# Patient Record
Sex: Male | Born: 1935 | Race: White | Hispanic: Yes | Marital: Married | Smoking: Never smoker
Health system: Southern US, Community
[De-identification: ages and names within clinical notes are randomized; demographics above are authoritative.]

## PROBLEM LIST (undated history)

## (undated) DIAGNOSIS — I2510 Atherosclerotic heart disease of native coronary artery without angina pectoris: Secondary | ICD-10-CM

## (undated) DIAGNOSIS — M1990 Unspecified osteoarthritis, unspecified site: Secondary | ICD-10-CM

## (undated) DIAGNOSIS — S12300A Unspecified displaced fracture of fourth cervical vertebra, initial encounter for closed fracture: Secondary | ICD-10-CM

## (undated) DIAGNOSIS — F0390 Unspecified dementia without behavioral disturbance: Secondary | ICD-10-CM

## (undated) DIAGNOSIS — Z8673 Personal history of transient ischemic attack (TIA), and cerebral infarction without residual deficits: Secondary | ICD-10-CM

## (undated) DIAGNOSIS — E785 Hyperlipidemia, unspecified: Secondary | ICD-10-CM

## (undated) DIAGNOSIS — Z889 Allergy status to unspecified drugs, medicaments and biological substances status: Secondary | ICD-10-CM

## (undated) DIAGNOSIS — S129XXA Fracture of neck, unspecified, initial encounter: Secondary | ICD-10-CM

## (undated) DIAGNOSIS — R4702 Dysphasia: Secondary | ICD-10-CM

## (undated) DIAGNOSIS — I10 Essential (primary) hypertension: Secondary | ICD-10-CM

## (undated) DIAGNOSIS — I482 Chronic atrial fibrillation, unspecified: Secondary | ICD-10-CM

## (undated) DIAGNOSIS — I63511 Cerebral infarction due to unspecified occlusion or stenosis of right middle cerebral artery: Principal | ICD-10-CM

## (undated) DIAGNOSIS — Z8719 Personal history of other diseases of the digestive system: Secondary | ICD-10-CM

## (undated) DIAGNOSIS — Z951 Presence of aortocoronary bypass graft: Secondary | ICD-10-CM

## (undated) DIAGNOSIS — Z87442 Personal history of urinary calculi: Secondary | ICD-10-CM

## (undated) DIAGNOSIS — S72009A Fracture of unspecified part of neck of unspecified femur, initial encounter for closed fracture: Secondary | ICD-10-CM

## (undated) DIAGNOSIS — H409 Unspecified glaucoma: Secondary | ICD-10-CM

## (undated) DIAGNOSIS — Z8711 Personal history of peptic ulcer disease: Secondary | ICD-10-CM

## (undated) DIAGNOSIS — K5792 Diverticulitis of intestine, part unspecified, without perforation or abscess without bleeding: Secondary | ICD-10-CM

## (undated) DIAGNOSIS — D649 Anemia, unspecified: Secondary | ICD-10-CM

## (undated) DIAGNOSIS — E039 Hypothyroidism, unspecified: Secondary | ICD-10-CM

## (undated) DIAGNOSIS — Z7901 Long term (current) use of anticoagulants: Secondary | ICD-10-CM

## (undated) DIAGNOSIS — I63512 Cerebral infarction due to unspecified occlusion or stenosis of left middle cerebral artery: Secondary | ICD-10-CM

## (undated) DIAGNOSIS — S32409A Unspecified fracture of unspecified acetabulum, initial encounter for closed fracture: Secondary | ICD-10-CM

## (undated) HISTORY — PX: APPENDECTOMY: SHX54

## (undated) HISTORY — DX: Diverticulitis of intestine, part unspecified, without perforation or abscess without bleeding: K57.92

## (undated) HISTORY — DX: Cerebral infarction due to unspecified occlusion or stenosis of left middle cerebral artery: I63.512

## (undated) HISTORY — PX: CATARACT EXTRACTION W/ INTRAOCULAR LENS  IMPLANT, BILATERAL: SHX1307

## (undated) HISTORY — DX: Allergy status to unspecified drugs, medicaments and biological substances: Z88.9

## (undated) HISTORY — DX: Atherosclerotic heart disease of native coronary artery without angina pectoris: I25.10

## (undated) HISTORY — DX: Personal history of urinary calculi: Z87.442

## (undated) HISTORY — DX: Chronic atrial fibrillation, unspecified: I48.20

## (undated) HISTORY — DX: Cerebral infarction due to unspecified occlusion or stenosis of right middle cerebral artery: I63.511

## (undated) HISTORY — DX: Fracture of unspecified part of neck of unspecified femur, initial encounter for closed fracture: S72.009A

## (undated) HISTORY — DX: Unspecified glaucoma: H40.9

## (undated) HISTORY — DX: Long term (current) use of anticoagulants: Z79.01

## (undated) HISTORY — PX: CARDIAC CATHETERIZATION: SHX172

## (undated) HISTORY — DX: Personal history of transient ischemic attack (TIA), and cerebral infarction without residual deficits: Z86.73

## (undated) HISTORY — DX: Presence of aortocoronary bypass graft: Z95.1

## (undated) HISTORY — PX: FRACTURE SURGERY: SHX138

## (undated) HISTORY — DX: Hyperlipidemia, unspecified: E78.5

## (undated) HISTORY — DX: Essential (primary) hypertension: I10

## (undated) HISTORY — DX: Unspecified displaced fracture of fourth cervical vertebra, initial encounter for closed fracture: S12.300A

---

## 1983-08-30 HISTORY — PX: FINGER AMPUTATION: SHX636

## 1992-08-29 DIAGNOSIS — I2510 Atherosclerotic heart disease of native coronary artery without angina pectoris: Secondary | ICD-10-CM

## 1992-08-29 DIAGNOSIS — Z951 Presence of aortocoronary bypass graft: Secondary | ICD-10-CM

## 1992-08-29 HISTORY — DX: Presence of aortocoronary bypass graft: Z95.1

## 1992-08-29 HISTORY — PX: CORONARY ARTERY BYPASS GRAFT: SHX141

## 1992-08-29 HISTORY — DX: Atherosclerotic heart disease of native coronary artery without angina pectoris: I25.10

## 1998-08-29 HISTORY — PX: FEMUR SURGERY: SHX943

## 2006-09-29 HISTORY — PX: TRANSTHORACIC ECHOCARDIOGRAM: SHX275

## 2006-09-29 HISTORY — PX: OTHER SURGICAL HISTORY: SHX169

## 2007-04-12 ENCOUNTER — Ambulatory Visit (HOSPITAL_COMMUNITY): Admission: RE | Admit: 2007-04-12 | Discharge: 2007-04-12 | Payer: Self-pay | Admitting: *Deleted

## 2007-06-05 ENCOUNTER — Encounter: Admission: RE | Admit: 2007-06-05 | Discharge: 2007-06-05 | Payer: Self-pay | Admitting: Gastroenterology

## 2012-12-15 ENCOUNTER — Encounter: Payer: Self-pay | Admitting: Pharmacist Clinician (PhC)/ Clinical Pharmacy Specialist

## 2012-12-15 DIAGNOSIS — I4891 Unspecified atrial fibrillation: Secondary | ICD-10-CM

## 2012-12-15 DIAGNOSIS — I482 Chronic atrial fibrillation, unspecified: Secondary | ICD-10-CM

## 2012-12-15 DIAGNOSIS — Z7901 Long term (current) use of anticoagulants: Secondary | ICD-10-CM

## 2012-12-15 HISTORY — DX: Chronic atrial fibrillation, unspecified: I48.20

## 2012-12-15 HISTORY — DX: Long term (current) use of anticoagulants: Z79.01

## 2013-01-08 ENCOUNTER — Telehealth: Payer: Self-pay | Admitting: Cardiology

## 2013-01-08 MED ORDER — SIMVASTATIN 40 MG PO TABS: 40.0000 mg | ORAL_TABLET | Freq: Every day | ORAL | Status: DC

## 2013-01-08 MED ORDER — CARVEDILOL 3.125 MG PO TABS: 3.1250 mg | ORAL_TABLET | Freq: Two times a day (BID) | ORAL | Status: DC

## 2013-01-08 NOTE — Telephone Encounter (Signed)
Need a prescription refill on : Simvastatin-40mg , Carvedilol.Marland KitchenMarland KitchenPharmacy---[facility] [address] in [place]...[phone]

## 2013-01-08 NOTE — Telephone Encounter (Signed)
Requested rx's sent electronically to pharmacy.

## 2013-01-24 ENCOUNTER — Ambulatory Visit: Payer: Self-pay | Admitting: Pharmacist Clinician (PhC)/ Clinical Pharmacy Specialist

## 2013-01-25 ENCOUNTER — Ambulatory Visit (INDEPENDENT_AMBULATORY_CARE_PROVIDER_SITE_OTHER): Payer: Medicare Other | Admitting: Pharmacist Clinician (PhC)/ Clinical Pharmacy Specialist

## 2013-01-25 VITALS — BP 150/90 | HR 76

## 2013-01-25 DIAGNOSIS — I4891 Unspecified atrial fibrillation: Secondary | ICD-10-CM

## 2013-01-25 DIAGNOSIS — Z7901 Long term (current) use of anticoagulants: Secondary | ICD-10-CM

## 2013-02-22 ENCOUNTER — Ambulatory Visit (INDEPENDENT_AMBULATORY_CARE_PROVIDER_SITE_OTHER): Payer: Medicare Other | Admitting: Pharmacist Clinician (PhC)/ Clinical Pharmacy Specialist

## 2013-02-22 VITALS — BP 138/72 | HR 72

## 2013-02-22 DIAGNOSIS — I4891 Unspecified atrial fibrillation: Secondary | ICD-10-CM

## 2013-02-22 DIAGNOSIS — Z7901 Long term (current) use of anticoagulants: Secondary | ICD-10-CM

## 2013-02-22 LAB — POCT INR: INR: 4.9

## 2013-03-04 ENCOUNTER — Ambulatory Visit (INDEPENDENT_AMBULATORY_CARE_PROVIDER_SITE_OTHER): Payer: Medicare Other | Admitting: Pharmacist Clinician (PhC)/ Clinical Pharmacy Specialist

## 2013-03-04 ENCOUNTER — Ambulatory Visit: Payer: Medicare Other | Admitting: Pharmacist Clinician (PhC)/ Clinical Pharmacy Specialist

## 2013-03-04 VITALS — BP 104/60 | HR 76

## 2013-03-04 DIAGNOSIS — Z7901 Long term (current) use of anticoagulants: Secondary | ICD-10-CM

## 2013-03-04 DIAGNOSIS — I4891 Unspecified atrial fibrillation: Secondary | ICD-10-CM

## 2013-03-18 ENCOUNTER — Ambulatory Visit (INDEPENDENT_AMBULATORY_CARE_PROVIDER_SITE_OTHER): Payer: Medicare Other | Admitting: Pharmacist Clinician (PhC)/ Clinical Pharmacy Specialist

## 2013-03-18 VITALS — BP 120/70 | HR 60

## 2013-03-18 DIAGNOSIS — I4891 Unspecified atrial fibrillation: Secondary | ICD-10-CM

## 2013-03-18 DIAGNOSIS — Z7901 Long term (current) use of anticoagulants: Secondary | ICD-10-CM

## 2013-04-09 ENCOUNTER — Ambulatory Visit: Payer: Medicare Other | Admitting: Pharmacist Clinician (PhC)/ Clinical Pharmacy Specialist

## 2013-04-11 ENCOUNTER — Ambulatory Visit (INDEPENDENT_AMBULATORY_CARE_PROVIDER_SITE_OTHER): Payer: Medicare Other | Admitting: Pharmacist Clinician (PhC)/ Clinical Pharmacy Specialist

## 2013-04-11 VITALS — BP 154/82 | HR 72

## 2013-04-11 DIAGNOSIS — Z7901 Long term (current) use of anticoagulants: Secondary | ICD-10-CM

## 2013-04-11 DIAGNOSIS — I4891 Unspecified atrial fibrillation: Secondary | ICD-10-CM

## 2013-05-09 ENCOUNTER — Ambulatory Visit (INDEPENDENT_AMBULATORY_CARE_PROVIDER_SITE_OTHER): Payer: Medicare Other | Admitting: Cardiology

## 2013-05-09 ENCOUNTER — Encounter: Payer: Self-pay | Admitting: Cardiology

## 2013-05-09 ENCOUNTER — Ambulatory Visit (INDEPENDENT_AMBULATORY_CARE_PROVIDER_SITE_OTHER): Payer: Medicare Other | Admitting: Pharmacist Clinician (PhC)/ Clinical Pharmacy Specialist

## 2013-05-09 VITALS — BP 100/62 | HR 52 | Ht 67.0 in | Wt 165.4 lb

## 2013-05-09 DIAGNOSIS — I2510 Atherosclerotic heart disease of native coronary artery without angina pectoris: Secondary | ICD-10-CM

## 2013-05-09 DIAGNOSIS — Z951 Presence of aortocoronary bypass graft: Secondary | ICD-10-CM

## 2013-05-09 DIAGNOSIS — Z7901 Long term (current) use of anticoagulants: Secondary | ICD-10-CM

## 2013-05-09 DIAGNOSIS — I4891 Unspecified atrial fibrillation: Secondary | ICD-10-CM

## 2013-05-09 DIAGNOSIS — I10 Essential (primary) hypertension: Secondary | ICD-10-CM

## 2013-05-09 DIAGNOSIS — E785 Hyperlipidemia, unspecified: Secondary | ICD-10-CM

## 2013-05-09 NOTE — Patient Instructions (Signed)
He looks GREAT!!  I want to make things easier & will cut the Lisinopril down to only 1 tab daily (~20mg  tab) not 2.   Otherwise, I will see him back in 1 yr or sooner if he has symptoms of Chest Pain, Pressure, or shortness of breath.  Marykay Lex, MD   Your physician wants you to follow-up in: 1 yr You will receive a reminder letter in the mail two months in advance. If you don't receive a letter, please call our office to schedule the follow-up appointment.

## 2013-05-22 NOTE — Progress Notes (Signed)
PCP: Aida Puffer, MD  Clinic Note: Chief Complaint  Patient presents with  . Annual Exam    no chest pain,no sob, no edema    HPI: Jacob [name] is a 79 y.o. male with a PMH below who presents today for followup of his coronary disease status post CABG and chronic A. fib. He is a very pleasant gentleman who enjoyed getting to know his family. Spanish-speaking only he is accompanied by either daughter or son-in-law, second marriage. He is a native of [place] he had CABG x4 in 1994 (LIMA-LAD, SVG- RI, SVG-OM, SVG- RCA). As best I can tell he has not had a catheterization since his CABG in 26. He had a last Myoview in 2008 which showed no evidence of ischemia with a fixed inferior defect. He also chronic atrial fibrillation, rate controlled and on Coumadin. I last saw him in August of 2013 and he was doing outstandingly well on a regimen.  Interval History: He presents today with his daughter as interpreter. He is in great spirits very impressive he walks up to 3 times a day for about 10-15 minutes or more. Any more than that is limited but it has been bad vision with glaucoma. They worry about his unstable balance. With the knot exertion he does he denies any chest tightness chest pressure or shortness of breath with rest or exertion. No heart failure symptoms of PND, orthopnea or edema. No syncope or near syncope. No TIA or amaurosis fugax symptoms. No melena, hematochezia or hematuria.  As I noted before he still has the weak legs in and out of it he is walking too much further than about a half mile or so. Some are longline he was on lisinopril 20 mg twice a day and has had some episodes of lightheadedness and dizziness.  Past Medical History  Diagnosis Date  . CAD in native artery -->  1994    Status or  . S/P CABG x 4 1994    LIMA-LAD, SVG-RI, SVG-OM, SVG-RCA  . Chronic atrial fibrillation 12/15/2012  . Long term (current) use of anticoagulants 12/15/2012  . Essential hypertension  05/23/2013  . Dyslipidemia, goal LDL below 70[272.4]      - on statin, followed by primary physician.   . Glaucoma     Prior Cardiac Evaluation and Past Surgical History: Past Surgical History  Procedure Laterality Date  . Coronary artery bypass graft  1994    LIMA-LAD, SVG-RI, SVG-OM, SVG-RCA   . Persantine myoview  February 2008    Fixed inferior wall defect-consistent with infarct/scar without peri-infarct ischemia  . Transthoracic echocardiogram  February 2008    Mild/moderate concentric LVH, normal size normal function. Mild diastolic function (abdomen excision), mild to moderate aortic regurgitation and mildly sclerotic aortic valve.   No Known Allergies  Current Outpatient Prescriptions  Medication Sig Dispense Refill  . carvedilol (COREG) 3.125 MG tablet Take 1 tablet (3.125 mg total) by mouth 2 (two) times daily.  60 tablet  6  . latanoprost (XALATAN) 0.005 % ophthalmic solution Place 1 drop into both eyes at bedtime.      Marland Kitchen lisinopril (PRINIVIL,ZESTRIL) 20 MG tablet Take 2 tablets by mouth daily      . simvastatin (ZOCOR) 40 MG tablet Take 1 tablet (40 mg total) by mouth at bedtime.  30 tablet  6  . SYNTHROID 112 MCG tablet Take 112 mcg by mouth daily.      . timolol (TIMOPTIC) 0.5 % ophthalmic solution Place 1 drop into both  eyes daily.      Marland Kitchen warfarin (COUMADIN) 5 MG tablet        No current facility-administered medications for this visit.    History   Social History Narrative   Very pleasant native [place] who is Spanish-speaking only after coming [place] in the 67s he is remarried with a total of 4 children, one daughter, and second marriage. He is a grandchildren and one great grand children at present.   He is extremely active and does routinely walk up to 3 times a day for 10-15 minutes at a time. He really is limited by an unstable balance and gait due to his glaucoma and inability to see well.   ROS: A comprehensive Review of Systems - As negative by all  systems. Poor vision due to glaucoma, unsteady gait with poor balance due to decreased vision.  PHYSICAL EXAM BP 100/62  Pulse 52  Ht 5\' 7"  (1.702 m)  Wt 165 lb 6.4 oz (75.025 kg)  BMI 25.9 kg/m2 General appearance: alert, cooperative, appears stated age, no distress and Relatively hard of hearing but also in Spanish only speaking. Very pleasant gentleman healthy appearing, and actually looks younger than his stated age. He has normal mood affect and exquisite appropriately in Spanish Neck: no adenopathy, no carotid bruit, no JVD, supple, symmetrical, trachea midline and thyroid not enlarged, symmetric, no tenderness/mass/nodules Lungs: clear to auscultation bilaterally, normal percussion bilaterally and Nonlabored, good air movement Heart: normal apical impulse, irregularly irregular rhythm, S1, S2 normal, no S3 or S4 and 2/6 crescendo/decrescendo SEM at RUSB --> carotids, very subtle possible diastolic murmur heard near the apex. Abdomen: soft, non-tender; bowel sounds normal; no masses,  no organomegaly Extremities: extremities normal, atraumatic, no cyanosis or edema, no edema, redness or tenderness in the calves or thighs and no ulcers, gangrene or trophic changes Pulses: 2+ and symmetric Neurologic: Grossly normal; clear thought process, A& Ox3.  YQM:VHQIONGEX today: Yes Rate: 52 , Rhythm: A. fib with slow ventricular response, otherwise normal;   Recent Labs: Lipids followed by primary physician  ASSESSMENT / PLAN: Chronic atrial fibrillation Rate well controlled so bradycardic on low dose of carvedilol. I am reluctant to fully stop the beta blocker, and he has not noticed any symptoms that would warrant when to stop. He is not symptomatic at all for A. Fib.  He is anticoagulated with warfarin, and rate controlled appropriately.  Long term (current) use of anticoagulants On warfarin therapy followed by [facility], or pH  CAD in native artery -->  CABG x4 in 1994 No active  angina symptoms. He is on the beta blocker, ACE inhibitor and statin. He has not taken aspirin but is on warfarin.  Continue current regiment, no change needed.  S/P CABG x 4 (1994) : LIMA-LAD, SVG-RI, SVG-OM, SVG-RCA 20 years post CABG with what would appear to be a lesion of the SVG-RCA all Myoview. He is not, nor the family very interested in pursuing aggressive evaluations with Myoview Stress Test unless symptoms occur.  Dyslipidemia, goal LDL below 70 - on statin, followed by primary physician. Currently followed by the primary physician on 40 mg of simvastatin. He is due for repeat laboratory studies in the near future. We could always check them here at the level of under our office, if this would be easier.  Essential hypertension Extremely well controlled on his current medications. Simply because the blood pressures in the 100 mm Hgb in the systolic range, I think he probably is on  too much ACE inhibitor, or good decrease in total 20 mg a day. He continued to 20 mg once a day or 10 mg twice a day.   Orders Placed This Encounter  Procedures  . EKG 12-Lead   Followup: One year  DAVID W. Herbie Baltimore, M.D., M.S. [facility] [address] [place], [place]  [address]  [phone] Pager # [phone]

## 2013-05-23 ENCOUNTER — Encounter: Payer: Self-pay | Admitting: Cardiology

## 2013-05-23 DIAGNOSIS — I2510 Atherosclerotic heart disease of native coronary artery without angina pectoris: Secondary | ICD-10-CM | POA: Insufficient documentation

## 2013-05-23 DIAGNOSIS — E785 Hyperlipidemia, unspecified: Secondary | ICD-10-CM | POA: Insufficient documentation

## 2013-05-23 DIAGNOSIS — Z951 Presence of aortocoronary bypass graft: Secondary | ICD-10-CM | POA: Insufficient documentation

## 2013-05-23 DIAGNOSIS — I10 Essential (primary) hypertension: Secondary | ICD-10-CM

## 2013-05-23 HISTORY — DX: Essential (primary) hypertension: I10

## 2013-05-23 NOTE — Assessment & Plan Note (Signed)
20 years post CABG with what would appear to be a lesion of the SVG-RCA all Myoview. He is not, nor the family very interested in pursuing aggressive evaluations with Myoview Stress Test unless symptoms occur.

## 2013-05-23 NOTE — Assessment & Plan Note (Signed)
Extremely well controlled on his current medications. Simply because the blood pressures in the 100 mm Hgb in the systolic range, I think he probably is on too much ACE inhibitor, or good decrease in total 20 mg a day. He continued to 20 mg once a day or 10 mg twice a day.

## 2013-05-23 NOTE — Assessment & Plan Note (Signed)
Currently followed by the primary physician on 40 mg of simvastatin. He is due for repeat laboratory studies in the near future. We could always check them here at the level of under our office, if this would be easier.

## 2013-05-23 NOTE — Assessment & Plan Note (Signed)
No active angina symptoms. He is on the beta blocker, ACE inhibitor and statin. He has not taken aspirin but is on warfarin.  Continue current regiment, no change needed.

## 2013-05-23 NOTE — Assessment & Plan Note (Signed)
Rate well controlled so bradycardic on low dose of carvedilol. I am reluctant to fully stop the beta blocker, and he has not noticed any symptoms that would warrant when to stop. He is not symptomatic at all for A. Fib.  He is anticoagulated with warfarin, and rate controlled appropriately.

## 2013-05-23 NOTE — Assessment & Plan Note (Signed)
On warfarin therapy followed by [facility], or pH

## 2013-06-06 ENCOUNTER — Ambulatory Visit: Payer: Medicare Other | Admitting: Pharmacist Clinician (PhC)/ Clinical Pharmacy Specialist

## 2013-06-10 ENCOUNTER — Ambulatory Visit (INDEPENDENT_AMBULATORY_CARE_PROVIDER_SITE_OTHER): Payer: Medicare Other | Admitting: Pharmacist Clinician (PhC)/ Clinical Pharmacy Specialist

## 2013-06-10 VITALS — BP 140/70 | HR 60

## 2013-06-10 DIAGNOSIS — I482 Chronic atrial fibrillation, unspecified: Secondary | ICD-10-CM

## 2013-06-10 DIAGNOSIS — I4891 Unspecified atrial fibrillation: Secondary | ICD-10-CM

## 2013-06-10 DIAGNOSIS — Z7901 Long term (current) use of anticoagulants: Secondary | ICD-10-CM

## 2013-07-03 LAB — COMPREHENSIVE METABOLIC PANEL
ALK PHOS: 76 U/L
ALT: 9
AST: 17 U/L
Albumin: 3.7
BILIRUBIN TOTAL: 0.4 mg/dL
CREATININE: 1.02
SODIUM: 142

## 2013-07-03 LAB — CBC
HGB: 11.2 g/dL
PLATELETS: 259
WBC: 5.6

## 2013-07-03 LAB — LIPID PANEL
CHOLESTEROL: 130
HDL: 41 mg/dL (ref 35–70)
LDL (calc): 64
Triglycerides: 123

## 2013-07-03 LAB — VITAMIN D 25 HYDROXY (VIT D DEFICIENCY, FRACTURES): VIT D 25 HYDROXY: 39

## 2013-07-03 LAB — TSH: TSH: 3.56 u[IU]/mL (ref 0.41–5.90)

## 2013-07-03 LAB — VITAMIN B12: VITAMIN B12: 441

## 2013-07-08 ENCOUNTER — Ambulatory Visit: Payer: Medicare Other | Admitting: Pharmacist Clinician (PhC)/ Clinical Pharmacy Specialist

## 2013-07-14 ENCOUNTER — Other Ambulatory Visit: Payer: Self-pay | Admitting: Cardiology

## 2013-08-14 ENCOUNTER — Other Ambulatory Visit: Payer: Self-pay | Admitting: Cardiology

## 2013-08-14 NOTE — Telephone Encounter (Signed)
Rx was sent to pharmacy electronically. 

## 2013-08-29 DIAGNOSIS — S12300A Unspecified displaced fracture of fourth cervical vertebra, initial encounter for closed fracture: Secondary | ICD-10-CM

## 2013-08-29 DIAGNOSIS — S129XXA Fracture of neck, unspecified, initial encounter: Secondary | ICD-10-CM

## 2013-08-29 HISTORY — DX: Fracture of neck, unspecified, initial encounter: S12.9XXA

## 2013-08-29 HISTORY — DX: Unspecified displaced fracture of fourth cervical vertebra, initial encounter for closed fracture: S12.300A

## 2013-10-29 ENCOUNTER — Ambulatory Visit (INDEPENDENT_AMBULATORY_CARE_PROVIDER_SITE_OTHER): Payer: Commercial Managed Care - HMO | Admitting: Pharmacist Clinician (PhC)/ Clinical Pharmacy Specialist

## 2013-10-29 VITALS — BP 150/68 | HR 60

## 2013-10-29 DIAGNOSIS — Z7901 Long term (current) use of anticoagulants: Secondary | ICD-10-CM

## 2013-10-29 DIAGNOSIS — I482 Chronic atrial fibrillation, unspecified: Secondary | ICD-10-CM

## 2013-10-29 DIAGNOSIS — I4891 Unspecified atrial fibrillation: Secondary | ICD-10-CM

## 2013-10-29 LAB — POCT INR: INR: 2.2

## 2013-11-20 ENCOUNTER — Emergency Department: Payer: Self-pay | Admitting: Internal Medicine

## 2013-11-20 LAB — CBC WITH DIFFERENTIAL/PLATELET
BASOS ABS: 0.1 10*3/uL (ref 0.0–0.1)
Basophil %: 0.7 %
EOS PCT: 1.5 %
Eosinophil #: 0.2 10*3/uL (ref 0.0–0.7)
HCT: 36.7 % — ABNORMAL LOW (ref 40.0–52.0)
HGB: 12.1 g/dL — ABNORMAL LOW (ref 13.0–18.0)
LYMPHS PCT: 14.4 %
Lymphocyte #: 1.5 10*3/uL (ref 1.0–3.6)
MCH: 32 pg (ref 26.0–34.0)
MCHC: 32.9 g/dL (ref 32.0–36.0)
MCV: 98 fL (ref 80–100)
MONO ABS: 1.1 x10 3/mm — AB (ref 0.2–1.0)
Monocyte %: 10.7 %
NEUTROS ABS: 7.6 10*3/uL — AB (ref 1.4–6.5)
Neutrophil %: 72.7 %
Platelet: 251 10*3/uL (ref 150–440)
RBC: 3.76 10*6/uL — AB (ref 4.40–5.90)
RDW: 13 % (ref 11.5–14.5)
WBC: 10.5 10*3/uL (ref 3.8–10.6)

## 2013-11-20 LAB — SEDIMENTATION RATE: Erythrocyte Sed Rate: 86 mm/hr — ABNORMAL HIGH (ref 0–20)

## 2013-11-20 LAB — BASIC METABOLIC PANEL
Anion Gap: 5 — ABNORMAL LOW (ref 7–16)
BUN: 25 mg/dL — AB (ref 7–18)
CREATININE: 1.41 mg/dL — AB (ref 0.60–1.30)
Calcium, Total: 9.5 mg/dL (ref 8.5–10.1)
Chloride: 106 mmol/L (ref 98–107)
Co2: 30 mmol/L (ref 21–32)
GFR CALC AF AMER: 49 — AB
GFR CALC NON AF AMER: 42 — AB
Glucose: 95 mg/dL (ref 65–99)
Osmolality: 285 (ref 275–301)
Potassium: 4 mmol/L (ref 3.5–5.1)
SODIUM: 141 mmol/L (ref 136–145)

## 2013-11-20 LAB — URIC ACID: URIC ACID: 6.5 mg/dL (ref 3.5–7.2)

## 2013-12-11 ENCOUNTER — Ambulatory Visit: Payer: Medicare Other | Admitting: Pharmacist Clinician (PhC)/ Clinical Pharmacy Specialist

## 2013-12-13 ENCOUNTER — Ambulatory Visit: Payer: Medicare Other | Admitting: Pharmacist Clinician (PhC)/ Clinical Pharmacy Specialist

## 2013-12-18 ENCOUNTER — Other Ambulatory Visit: Payer: Self-pay | Admitting: Cardiology

## 2013-12-20 ENCOUNTER — Ambulatory Visit: Payer: Medicare Other | Admitting: Pharmacist Clinician (PhC)/ Clinical Pharmacy Specialist

## 2013-12-27 ENCOUNTER — Ambulatory Visit (INDEPENDENT_AMBULATORY_CARE_PROVIDER_SITE_OTHER): Payer: Commercial Managed Care - HMO | Admitting: Pharmacist Clinician (PhC)/ Clinical Pharmacy Specialist

## 2013-12-27 DIAGNOSIS — I482 Chronic atrial fibrillation, unspecified: Secondary | ICD-10-CM

## 2013-12-27 DIAGNOSIS — I4891 Unspecified atrial fibrillation: Secondary | ICD-10-CM

## 2013-12-27 DIAGNOSIS — Z7901 Long term (current) use of anticoagulants: Secondary | ICD-10-CM

## 2013-12-27 LAB — POCT INR: INR: 3.6

## 2014-01-22 ENCOUNTER — Ambulatory Visit: Payer: Medicare Other | Admitting: Pharmacist Clinician (PhC)/ Clinical Pharmacy Specialist

## 2014-01-29 ENCOUNTER — Ambulatory Visit (INDEPENDENT_AMBULATORY_CARE_PROVIDER_SITE_OTHER): Payer: Commercial Managed Care - HMO | Admitting: Pharmacist Clinician (PhC)/ Clinical Pharmacy Specialist

## 2014-01-29 DIAGNOSIS — Z7901 Long term (current) use of anticoagulants: Secondary | ICD-10-CM

## 2014-01-29 DIAGNOSIS — I4891 Unspecified atrial fibrillation: Secondary | ICD-10-CM

## 2014-01-29 DIAGNOSIS — I482 Chronic atrial fibrillation, unspecified: Secondary | ICD-10-CM

## 2014-01-29 LAB — POCT INR: INR: 2

## 2014-03-18 ENCOUNTER — Ambulatory Visit: Payer: Commercial Managed Care - HMO | Admitting: Pharmacist Clinician (PhC)/ Clinical Pharmacy Specialist

## 2014-03-21 ENCOUNTER — Ambulatory Visit: Payer: Commercial Managed Care - HMO | Admitting: Pharmacist Clinician (PhC)/ Clinical Pharmacy Specialist

## 2014-03-24 ENCOUNTER — Ambulatory Visit (INDEPENDENT_AMBULATORY_CARE_PROVIDER_SITE_OTHER): Payer: Commercial Managed Care - HMO | Admitting: Pharmacist Clinician (PhC)/ Clinical Pharmacy Specialist

## 2014-03-24 DIAGNOSIS — Z7901 Long term (current) use of anticoagulants: Secondary | ICD-10-CM

## 2014-03-24 DIAGNOSIS — I4891 Unspecified atrial fibrillation: Secondary | ICD-10-CM

## 2014-03-24 DIAGNOSIS — I482 Chronic atrial fibrillation, unspecified: Secondary | ICD-10-CM

## 2014-03-24 LAB — POCT INR: INR: 2.5

## 2014-03-24 MED ORDER — WARFARIN SODIUM 5 MG PO TABS: ORAL_TABLET | ORAL | Status: DC

## 2014-03-25 ENCOUNTER — Other Ambulatory Visit: Payer: Self-pay | Admitting: Pharmacist Clinician (PhC)/ Clinical Pharmacy Specialist

## 2014-03-25 MED ORDER — WARFARIN SODIUM 5 MG PO TABS: ORAL_TABLET | ORAL | Status: DC

## 2014-05-07 ENCOUNTER — Ambulatory Visit (INDEPENDENT_AMBULATORY_CARE_PROVIDER_SITE_OTHER): Payer: Medicare HMO | Admitting: Pharmacist Clinician (PhC)/ Clinical Pharmacy Specialist

## 2014-05-07 DIAGNOSIS — I482 Chronic atrial fibrillation, unspecified: Secondary | ICD-10-CM

## 2014-05-07 DIAGNOSIS — Z7901 Long term (current) use of anticoagulants: Secondary | ICD-10-CM

## 2014-05-07 DIAGNOSIS — I4891 Unspecified atrial fibrillation: Secondary | ICD-10-CM

## 2014-05-07 LAB — POCT INR: INR: 2.1

## 2014-06-13 ENCOUNTER — Other Ambulatory Visit: Payer: Self-pay | Admitting: Cardiology

## 2014-06-13 NOTE — Telephone Encounter (Signed)
,  Rx was sent to pharmacy electronically. OV 11/2

## 2014-06-16 ENCOUNTER — Ambulatory Visit (INDEPENDENT_AMBULATORY_CARE_PROVIDER_SITE_OTHER): Payer: 59 | Admitting: Pharmacist Clinician (PhC)/ Clinical Pharmacy Specialist

## 2014-06-16 DIAGNOSIS — I4891 Unspecified atrial fibrillation: Secondary | ICD-10-CM

## 2014-06-16 DIAGNOSIS — I482 Chronic atrial fibrillation, unspecified: Secondary | ICD-10-CM

## 2014-06-16 DIAGNOSIS — Z7901 Long term (current) use of anticoagulants: Secondary | ICD-10-CM

## 2014-06-16 LAB — POCT INR: INR: 2.2

## 2014-06-30 ENCOUNTER — Encounter: Payer: Self-pay | Admitting: Cardiology

## 2014-06-30 ENCOUNTER — Ambulatory Visit (INDEPENDENT_AMBULATORY_CARE_PROVIDER_SITE_OTHER): Payer: Medicare HMO | Admitting: Pharmacist Clinician (PhC)/ Clinical Pharmacy Specialist

## 2014-06-30 ENCOUNTER — Ambulatory Visit (INDEPENDENT_AMBULATORY_CARE_PROVIDER_SITE_OTHER): Payer: Medicare HMO | Admitting: Cardiology

## 2014-06-30 VITALS — BP 130/84 | HR 73 | Ht 67.0 in | Wt 163.8 lb

## 2014-06-30 DIAGNOSIS — I482 Chronic atrial fibrillation, unspecified: Secondary | ICD-10-CM

## 2014-06-30 DIAGNOSIS — I4891 Unspecified atrial fibrillation: Secondary | ICD-10-CM

## 2014-06-30 DIAGNOSIS — Z7901 Long term (current) use of anticoagulants: Secondary | ICD-10-CM

## 2014-06-30 DIAGNOSIS — Z951 Presence of aortocoronary bypass graft: Secondary | ICD-10-CM

## 2014-06-30 DIAGNOSIS — I2510 Atherosclerotic heart disease of native coronary artery without angina pectoris: Secondary | ICD-10-CM

## 2014-06-30 DIAGNOSIS — I10 Essential (primary) hypertension: Secondary | ICD-10-CM

## 2014-06-30 DIAGNOSIS — E785 Hyperlipidemia, unspecified: Secondary | ICD-10-CM

## 2014-06-30 LAB — POCT INR: INR: 2.4

## 2014-06-30 MED ORDER — CARVEDILOL 3.125 MG PO TABS: 3.1250 mg | ORAL_TABLET | Freq: Two times a day (BID) | ORAL | Status: DC

## 2014-06-30 MED ORDER — LISINOPRIL 20 MG PO TABS: ORAL_TABLET | ORAL | Status: DC

## 2014-06-30 MED ORDER — SIMVASTATIN 20 MG PO TABS: 20.0000 mg | ORAL_TABLET | Freq: Every day | ORAL | Status: DC

## 2014-06-30 MED ORDER — WARFARIN SODIUM 5 MG PO TABS: ORAL_TABLET | ORAL | Status: DC

## 2014-06-30 NOTE — Progress Notes (Signed)
PCP: Aida PufferLITTLE,JAMES, MD  Clinic Note: Chief Complaint  Patient presents with  . Follow-up    pt denies chest pain, swelling, and sob    HPI: Jacob [name] is a 80 y.o. male with a PMH below who presents today for annual follow-up CAD-CABG as well as chronic A. Fib. He is a Spanish-speaking only elderly gentleman who is originally from [place]. He is usually accompanied by his son-in-law ( husband of a daughter from his second marriage).  Past Medical History  Diagnosis Date  . CAD in native artery -->  1994    referred for CABG  . S/P CABG x 4 1994    a) 1994: LIMA-LAD, SVG-RI, SVG-OM, SVG-RCA; b) 09/2006, Persantine Myoview: fixed inferior defect consistent with infarct/scar without peri-infarct ischemia;; 2-D echo: Mild-mod concentric LVH. Normal EF.  Abnormal relaxation, aortic sclerosis with mild-mod AI  . Chronic atrial fibrillation 12/15/2012  . Long term (current) use of anticoagulants 12/15/2012    warfarin  . Essential hypertension 05/23/2013  . Dyslipidemia, goal LDL below 70[272.4]      - on statin, followed by primary physician.   . Glaucoma    Interval History: as is usually the case for him, he presents here without any complaints at all. He is in great spirits and he says he still walks 3 times a day up to 10-15 minutes at a time without any complaints. He does not walk fast but he does walk. He denies any chest tightness, pressure or dyspnea with rest or exertion. He can't walk too fast mostly because of balance issues and arthritis and balance issues.. He has no complaints of PND, orthopnea or edema. He has a somewhat unsteady gait if he walks too fast, but denies any dizziness or syncope/near syncope, TIA/amaurosis fugax. He denies any sensation of irregular or rapid heartbeats. No bleeding complications from being on warfarin. No myalgias from statin.  A comprehensive ROS was performed. Review of Systems  Constitutional: Negative for weight loss and malaise/fatigue.    HENT: Negative for congestion and nosebleeds.   Respiratory: Negative for cough, shortness of breath and wheezing.   Cardiovascular: Negative.  Negative for claudication.       Per history of present illness  Gastrointestinal: Positive for constipation. Negative for blood in stool and melena.  Genitourinary: Negative for hematuria.  Musculoskeletal: Positive for back pain and joint pain. Negative for falls.       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  Neurological: Negative for headaches.  Endo/Heme/Allergies: Does not bruise/bleed easily.  All other systems reviewed and are negative.   Current Outpatient Prescriptions on File Prior to Visit  Medication Sig Dispense Refill  . latanoprost (XALATAN) 0.005 % ophthalmic solution Place 1 drop into both eyes at bedtime.    Marland Kitchen. SYNTHROID 112 MCG tablet Take 112 mcg by mouth daily.    . timolol (TIMOPTIC) 0.5 % ophthalmic solution Place 1 drop into both eyes daily.     No current facility-administered medications on file prior to visit.   ALLERGIES REVIEWED IN EPIC -- no change SOCIAL AND FAMILY HISTORY REVIEWED IN EPIC -- no change  Wt Readings from Last 3 Encounters:  06/30/14 163 lb 12.8 oz (74.299 kg)  05/09/13 165 lb 6.4 oz (75.025 kg)    PHYSICAL EXAM BP 130/84 mmHg  Pulse 73  Ht 5\' 7"  (1.702 m)  Wt 163 lb 12.8 oz (74.299  kg)  BMI 25.65 kg/m2 General appearance: alert, cooperative, appears stated age, no distress and Relatively hard of hearing but also in Spanish only speaking. Very pleasant gentleman healthy appearing, and actually looks younger than his stated age. He has normal mood affect and exquisite appropriately in Spanish Neck: no adenopathy, no carotid bruit, no JVD, supple, symmetrical, trachea midline and thyroid not enlarged, symmetric, no tenderness/mass/nodules Lungs: clear to auscultation bilaterally, normal percussion  bilaterally and Nonlabored, good air movement Heart: normal apical impulse, irregularly irregular rhythm, S1, S2 normal, no S3 or S4 and 2/6 crescendo/decrescendo SEM at RUSB --> carotids, very subtle possible diastolic murmur heard near the apex. Abdomen: soft, non-tender; bowel sounds normal; no masses, no organomegaly Extremities: extremities normal, atraumatic, no cyanosis or edema, no edema, redness or tenderness in the calves or thighs and no ulcers, gangrene or trophic changes Pulses: 2+ and symmetric Neurologic: Grossly normal; clear thought process, A& Ox3.   Adult ECG Report  Rate: 73 ;  Rhythm: atrial fibrillation and premature ventricular contractions (PVC) versus aberrancy  Narrative Interpretation: otherwise normal EKG with baseline wander  Recent Labs:  None available    ASSESSMENT / PLAN: CAD in native artery -->  CABG x4 in 1994 Stable with no active anginal symptoms. He is on a beta blocker and ACE inhibitor as well as statin. No complaints.  He continued to be active with routine walking exercise. He is no longer taking aspirin because of being on warfarin.  Chronic atrial fibrillation Well-controlled rate with beta blocker on low-dose. He is asymptomatic. Anticoagulated with warfarin without any bleeding issues  Dyslipidemia, goal LDL below 70 - on statin, followed by primary physician. He is on a statin. Tolerating well without myalgias. Labs are being monitored by PCP. I have not seen any labs recently.  S/P CABG x 4 (1994) : LIMA-LAD, SVG-RI, SVG-OM, SVG-RCA Well beyond 20 years past CABG. He did have an inferior infarct with suggestive of RCA lesion from previous echo and Myoview evaluations. With him being is active and healthy as he is, I'm reluctant to do any additional evaluation.  Long term current use of anticoagulant therapy On warfarin. Monitor here at the office.  Current INR 2.4, goal    Orders Placed This Encounter  Procedures  . EKG 12-Lead    Meds ordered this encounter  Medications  . FLUZONE HIGH-DOSE 0.5 ML SUSY    Sig:     Refill:  0  . carvedilol (COREG) 3.125 MG tablet    Sig: Take 1 tablet (3.125 mg total) by mouth 2 (two) times daily.    Dispense:  60 tablet    Refill:  11  . lisinopril (PRINIVIL,ZESTRIL) 20 MG tablet    Sig: TAKE 1 TABLETS BY MOUTH EVERY DAY    Dispense:  30 tablet    Refill:  11  . warfarin (COUMADIN) 5 MG tablet    Sig: Take 1 tablet by mouth daily or as directed by coumadin clinic    Dispense:  30 tablet    Refill:  6  . simvastatin (ZOCOR) 20 MG tablet    Sig: Take 1 tablet (20 mg total) by mouth at bedtime.    Dispense:  30 tablet    Refill:  11    Followup: one year   HARDING,DAVID W, M.D., M.S. Interventional Cardiologist   Pager # [phone]

## 2014-06-30 NOTE — Patient Instructions (Signed)
Medication refilled   Decrease simvastatin to 20 mg.   Your physician wants you to follow-up in 12 months Dr Herbie BaltimoreHARDING. 30 MINS APPT.  You will receive a reminder letter in the mail two months in advance. If you don't receive a letter, please call our office to schedule the follow-up appointment.

## 2014-07-01 ENCOUNTER — Encounter: Payer: Self-pay | Admitting: Cardiology

## 2014-07-01 NOTE — Assessment & Plan Note (Signed)
Stable with no active anginal symptoms. He is on a beta blocker and ACE inhibitor as well as statin. No complaints.  He continued to be active with routine walking exercise. He is no longer taking aspirin because of being on warfarin.

## 2014-07-01 NOTE — Assessment & Plan Note (Addendum)
Well-controlled rate with beta blocker on low-dose. He is asymptomatic. Anticoagulated with warfarin without any bleeding issues

## 2014-07-01 NOTE — Assessment & Plan Note (Addendum)
On warfarin. Monitor here at the office.  Current INR 2.4, goal

## 2014-07-01 NOTE — Assessment & Plan Note (Signed)
Well beyond 20 years past CABG. He did have an inferior infarct with suggestive of RCA lesion from previous echo and Myoview evaluations. With him being is active and healthy as he is, I'm reluctant to do any additional evaluation.

## 2014-07-01 NOTE — Assessment & Plan Note (Signed)
He is on a statin. Tolerating well without myalgias. Labs are being monitored by PCP. I have not seen any labs recently.

## 2014-08-01 ENCOUNTER — Emergency Department: Payer: Self-pay | Admitting: Student

## 2014-08-01 ENCOUNTER — Encounter (HOSPITAL_COMMUNITY): Payer: Self-pay | Admitting: Internal Medicine

## 2014-08-01 ENCOUNTER — Inpatient Hospital Stay (HOSPITAL_COMMUNITY)
Admission: AD | Admit: 2014-08-01 | Discharge: 2014-08-06 | DRG: 552 | Disposition: A | Discharge status: Skilled Nursing Facility | Payer: Medicare HMO | Source: Other Acute Inpatient Hospital | Attending: Internal Medicine | Admitting: Internal Medicine

## 2014-08-01 DIAGNOSIS — W010XXA Fall on same level from slipping, tripping and stumbling without subsequent striking against object, initial encounter: Secondary | ICD-10-CM | POA: Diagnosis present

## 2014-08-01 DIAGNOSIS — M4802 Spinal stenosis, cervical region: Secondary | ICD-10-CM | POA: Diagnosis present

## 2014-08-01 DIAGNOSIS — E785 Hyperlipidemia, unspecified: Secondary | ICD-10-CM | POA: Diagnosis present

## 2014-08-01 DIAGNOSIS — I2510 Atherosclerotic heart disease of native coronary artery without angina pectoris: Secondary | ICD-10-CM | POA: Diagnosis present

## 2014-08-01 DIAGNOSIS — E039 Hypothyroidism, unspecified: Secondary | ICD-10-CM | POA: Diagnosis present

## 2014-08-01 DIAGNOSIS — I351 Nonrheumatic aortic (valve) insufficiency: Secondary | ICD-10-CM | POA: Diagnosis present

## 2014-08-01 DIAGNOSIS — R29898 Other symptoms and signs involving the musculoskeletal system: Secondary | ICD-10-CM

## 2014-08-01 DIAGNOSIS — H409 Unspecified glaucoma: Secondary | ICD-10-CM | POA: Diagnosis present

## 2014-08-01 DIAGNOSIS — R7989 Other specified abnormal findings of blood chemistry: Secondary | ICD-10-CM

## 2014-08-01 DIAGNOSIS — R1313 Dysphagia, pharyngeal phase: Secondary | ICD-10-CM | POA: Diagnosis present

## 2014-08-01 DIAGNOSIS — S12400A Unspecified displaced fracture of fifth cervical vertebra, initial encounter for closed fracture: Secondary | ICD-10-CM | POA: Diagnosis present

## 2014-08-01 DIAGNOSIS — K5900 Constipation, unspecified: Secondary | ICD-10-CM | POA: Diagnosis present

## 2014-08-01 DIAGNOSIS — Z951 Presence of aortocoronary bypass graft: Secondary | ICD-10-CM

## 2014-08-01 DIAGNOSIS — I482 Chronic atrial fibrillation, unspecified: Secondary | ICD-10-CM | POA: Diagnosis present

## 2014-08-01 DIAGNOSIS — Y929 Unspecified place or not applicable: Secondary | ICD-10-CM | POA: Diagnosis not present

## 2014-08-01 DIAGNOSIS — I10 Essential (primary) hypertension: Secondary | ICD-10-CM | POA: Diagnosis present

## 2014-08-01 DIAGNOSIS — S12300A Unspecified displaced fracture of fourth cervical vertebra, initial encounter for closed fracture: Principal | ICD-10-CM | POA: Diagnosis present

## 2014-08-01 DIAGNOSIS — R1310 Dysphagia, unspecified: Secondary | ICD-10-CM

## 2014-08-01 DIAGNOSIS — R609 Edema, unspecified: Secondary | ICD-10-CM | POA: Diagnosis present

## 2014-08-01 DIAGNOSIS — R748 Abnormal levels of other serum enzymes: Secondary | ICD-10-CM | POA: Diagnosis present

## 2014-08-01 DIAGNOSIS — Z7901 Long term (current) use of anticoagulants: Secondary | ICD-10-CM | POA: Diagnosis not present

## 2014-08-01 DIAGNOSIS — R778 Other specified abnormalities of plasma proteins: Secondary | ICD-10-CM | POA: Diagnosis present

## 2014-08-01 DIAGNOSIS — Y9301 Activity, walking, marching and hiking: Secondary | ICD-10-CM | POA: Diagnosis not present

## 2014-08-01 DIAGNOSIS — M542 Cervicalgia: Secondary | ICD-10-CM | POA: Diagnosis present

## 2014-08-01 DIAGNOSIS — S060X9A Concussion with loss of consciousness of unspecified duration, initial encounter: Secondary | ICD-10-CM | POA: Diagnosis present

## 2014-08-01 DIAGNOSIS — Z66 Do not resuscitate: Secondary | ICD-10-CM | POA: Diagnosis present

## 2014-08-01 LAB — CBC
HCT: 36.3 % — ABNORMAL LOW (ref 40.0–52.0)
HGB: 11.8 g/dL — AB (ref 13.0–18.0)
MCH: 31.1 pg (ref 26.0–34.0)
MCHC: 32.5 g/dL (ref 32.0–36.0)
MCV: 96 fL (ref 80–100)
Platelet: 230 10*3/uL (ref 150–440)
RBC: 3.79 10*6/uL — AB (ref 4.40–5.90)
RDW: 13.9 % (ref 11.5–14.5)
WBC: 15.7 10*3/uL — ABNORMAL HIGH (ref 3.8–10.6)

## 2014-08-01 LAB — BASIC METABOLIC PANEL
ANION GAP: 8 (ref 7–16)
BUN: 25 mg/dL — ABNORMAL HIGH (ref 7–18)
CO2: 27 mmol/L (ref 21–32)
Calcium, Total: 8.7 mg/dL (ref 8.5–10.1)
Chloride: 107 mmol/L (ref 98–107)
Creatinine: 1.17 mg/dL (ref 0.60–1.30)
EGFR (Non-African Amer.): >60
GLUCOSE: 128 mg/dL — AB (ref 65–99)
OSMOLALITY: 289 (ref 275–301)
POTASSIUM: 3.9 mmol/L (ref 3.5–5.1)
SODIUM: 142 mmol/L (ref 136–145)

## 2014-08-01 LAB — PROTIME-INR
INR: 2.1
PROTHROMBIN TIME: 22.7 s — AB (ref 11.5–14.7)

## 2014-08-01 LAB — CK: CK, Total: 492 U/L — ABNORMAL HIGH (ref 39–308)

## 2014-08-01 LAB — APTT: Activated PTT: 34.9 secs (ref 23.6–35.9)

## 2014-08-01 MED ORDER — DOCUSATE SODIUM 100 MG PO CAPS
100.0000 mg | ORAL_CAPSULE | Freq: Two times a day (BID) | ORAL | Status: DC
  Filled 2014-08-01 (×2): qty 1

## 2014-08-01 MED ORDER — SODIUM CHLORIDE 0.9 % IJ SOLN
3.0000 mL | Freq: Two times a day (BID) | INTRAMUSCULAR | Status: DC
  Administered 2014-08-02 – 2014-08-04 (×7): 3 mL via INTRAVENOUS

## 2014-08-01 MED ORDER — SODIUM CHLORIDE 0.9 % IV SOLN
INTRAVENOUS | Status: AC
  Administered 2014-08-02: via INTRAVENOUS

## 2014-08-01 MED ORDER — TIMOLOL MALEATE 0.5 % OP SOLN
1.0000 [drp] | Freq: Every day | OPHTHALMIC | Status: DC
  Administered 2014-08-02 – 2014-08-06 (×5): 1 [drp] via OPHTHALMIC
  Filled 2014-08-01: qty 5

## 2014-08-01 MED ORDER — ACETAMINOPHEN 650 MG RE SUPP: 650.0000 mg | Freq: Four times a day (QID) | RECTAL | Status: DC | PRN

## 2014-08-01 MED ORDER — CARVEDILOL 3.125 MG PO TABS
3.1250 mg | ORAL_TABLET | Freq: Two times a day (BID) | ORAL | Status: DC
  Administered 2014-08-02: 3.125 mg via ORAL
  Filled 2014-08-01 (×3): qty 1

## 2014-08-01 MED ORDER — ONDANSETRON HCL 4 MG/2ML IJ SOLN: 4.0000 mg | Freq: Four times a day (QID) | INTRAMUSCULAR | Status: DC | PRN

## 2014-08-01 MED ORDER — LEVOTHYROXINE SODIUM 112 MCG PO TABS
112.0000 ug | ORAL_TABLET | Freq: Every day | ORAL | Status: DC
  Administered 2014-08-02: 112 ug via ORAL
  Filled 2014-08-01 (×2): qty 1

## 2014-08-01 MED ORDER — LATANOPROST 0.005 % OP SOLN
1.0000 [drp] | Freq: Every day | OPHTHALMIC | Status: DC
  Administered 2014-08-02 – 2014-08-05 (×5): 1 [drp] via OPHTHALMIC
  Filled 2014-08-01: qty 2.5

## 2014-08-01 MED ORDER — ACETAMINOPHEN 325 MG PO TABS
650.0000 mg | ORAL_TABLET | Freq: Four times a day (QID) | ORAL | Status: DC | PRN
  Administered 2014-08-04: 650 mg via ORAL
  Filled 2014-08-01: qty 2

## 2014-08-01 MED ORDER — MORPHINE SULFATE 2 MG/ML IJ SOLN
2.0000 mg | INTRAMUSCULAR | Status: DC | PRN
  Administered 2014-08-02: 2 mg via INTRAVENOUS

## 2014-08-01 MED ORDER — HYDROCODONE-ACETAMINOPHEN 5-325 MG PO TABS
1.0000 | ORAL_TABLET | ORAL | Status: DC | PRN
  Administered 2014-08-02: 1 via ORAL

## 2014-08-01 MED ORDER — ONDANSETRON HCL 4 MG PO TABS: 4.0000 mg | ORAL_TABLET | Freq: Four times a day (QID) | ORAL | Status: DC | PRN

## 2014-08-01 MED ORDER — SIMVASTATIN 20 MG PO TABS
20.0000 mg | ORAL_TABLET | Freq: Every day | ORAL | Status: DC
  Filled 2014-08-01 (×2): qty 1

## 2014-08-01 MED ORDER — LISINOPRIL 20 MG PO TABS
20.0000 mg | ORAL_TABLET | Freq: Every day | ORAL | Status: DC
  Filled 2014-08-01: qty 1

## 2014-08-01 NOTE — H&P (Addendum)
PCP: Aida PufferLITTLE,JAMES, MD    Chief Complaint:   fall  HPI: Jacob [name] is a 80 y.o. male   has a past medical history of CAD in native artery -->  (1994); S/P CABG x 4 (1994); Chronic atrial fibrillation (12/15/2012); Long term (current) use of anticoagulants (12/15/2012); Essential hypertension (05/23/2013); Dyslipidemia, goal LDL below 70[272.4]; and Glaucoma.   Presented with  A fall into a ditch while walking. He saw a car and tried to get out of the way and slipped into a ditch. For 2 hours he was missing. Finally he was found and brought to [facility]. Patient endorses transiently losing consciousness after his fall. Denies any chest pain.  In ER he was found to have C4-C5 fracture with possible perivertibral hematoma.  [place] ER physician has discussed case with Neurosurgery Dr. Venetia MaxonStern who felt at that point there was indication for operative intervention. [facility].  Patient endorses some neck pain and right groin pain.   Neck fracture with possible hematoma.   Review of Systems:    Pertinent positives include: right inguinal pain  Constitutional:  No weight loss, night sweats, Fevers, chills, fatigue, weight loss  HEENT:  No headaches, Difficulty swallowing,Tooth/dental problems,Sore throat,  No sneezing, itching, ear ache, nasal congestion, post nasal drip,  Cardio-vascular:  No chest pain, Orthopnea, PND, anasarca, dizziness, palpitations.no Bilateral lower extremity swelling  GI:  No heartburn, indigestion, abdominal pain, nausea, vomiting, diarrhea, change in bowel habits, loss of appetite, melena, blood in stool, hematemesis Resp:  no shortness of breath at rest. No dyspnea on exertion, No excess mucus, no productive cough, No non-productive cough, No coughing up of blood.No change in color of mucus.No wheezing. Skin:  no rash or lesions. No jaundice GU:  no dysuria, change in color of urine, no urgency or frequency. No  straining to urinate.  No flank pain.  Musculoskeletal:  No joint pain or no joint swelling. No decreased range of motion. No back pain.  Psych:  No change in mood or affect. No depression or anxiety. No memory loss.  Neuro: no localizing neurological complaints, no tingling, no weakness, no double vision, no gait abnormality, no slurred speech, no confusion  Otherwise ROS are negative except for above, 10 systems were reviewed  Past Medical History: Past Medical History  Diagnosis Date  . CAD in native artery -->  1994    referred for CABG  . S/P CABG x 4 1994    a) 1994: LIMA-LAD, SVG-RI, SVG-OM, SVG-RCA; b) 09/2006, Persantine Myoview: fixed inferior defect consistent with infarct/scar without peri-infarct ischemia;; 2-D echo: Mild-mod concentric LVH. Normal EF.  Abnormal relaxation, aortic sclerosis with mild-mod AI  . Chronic atrial fibrillation 12/15/2012  . Long term (current) use of anticoagulants 12/15/2012    warfarin  . Essential hypertension 05/23/2013  . Dyslipidemia, goal LDL below 70[272.4]      - on statin, followed by primary physician.   Marland Kitchen. Glaucoma    Past Surgical History  Procedure Laterality Date  . Coronary artery bypass graft  1994    LIMA-LAD, SVG-RI, SVG-OM, SVG-RCA   . Persantine myoview  February 2008    Fixed inferior wall defect-consistent with infarct/scar without peri-infarct ischemia  . Transthoracic echocardiogram  February 2008    Mild/mod conc LVH, Nl Size & Fxn, Gr 1 DD(abnormal relaxation), mild to moderate aortic regurgitation and mildly sclerotic aortic valve.     Medications: Prior to Admission medications   Medication Sig Start Date  End Date Taking? Authorizing Provider  carvedilol (COREG) 3.125 MG tablet Take 1 tablet (3.125 mg total) by mouth 2 (two) times daily. 06/30/14   Marykay Lexavid W Harding, MD  FLUZONE HIGH-DOSE 0.5 ML SUSY  05/31/14   Historical Provider, MD  latanoprost (XALATAN) 0.005 % ophthalmic solution Place 1 drop into both eyes at  bedtime. 04/11/13   Historical Provider, MD  lisinopril (PRINIVIL,ZESTRIL) 20 MG tablet TAKE 1 TABLETS BY MOUTH EVERY DAY 06/30/14   Marykay Lexavid W Harding, MD  simvastatin (ZOCOR) 20 MG tablet Take 1 tablet (20 mg total) by mouth at bedtime. 06/30/14   Marykay Lexavid W Harding, MD  SYNTHROID 112 MCG tablet Take 112 mcg by mouth daily. 04/25/13   Historical Provider, MD  timolol (TIMOPTIC) 0.5 % ophthalmic solution Place 1 drop into both eyes daily. 04/11/13   Historical Provider, MD  warfarin (COUMADIN) 5 MG tablet Take 1 tablet by mouth daily or as directed by coumadin clinic Patient taking differently: 5 mg on Sunday and Wed? And 2.5 all other days. 06/30/14   Marykay Lexavid W Harding, MD    Allergies:  No Known Allergies  Social History:  Ambulatory  independently   Lives at home  With family    reports that he has never smoked. He does not have any smokeless tobacco history on file. He reports that he does not drink alcohol or use illicit drugs.    Family History: family history includes Liver cancer in his mother; Prostate cancer in his father.    Physical Exam: Patient Vitals for the past 24 hrs:  BP Temp Temp src Resp SpO2 Weight  08/01/14 2253 (!) 172/90 mmHg 98 F (36.7 C) Oral (!) 30 97 % 72.4 kg (159 lb 9.8 oz)    1. General:  in No Acute distress 2. Psychological: Alert and  Oriented 3. Head/ENT:   Moist   Mucous Membranes                          Head Non traumatic, neck supple                            Poor Dentition 4. SKIN: normal   Skin turgor,  Skin clean Dry and intact no rash 5. Heart: Regular rate and rhythm no Murmur, Rub or gallop 6. Lungs: Clear to auscultation bilaterally, no wheezes or crackles   7. Abdomen: Soft, non-tender, Non distended 8. Lower extremities: no clubbing, cyanosis, or edema 9. Neurologically diminished grip on the right, Right leg has some pain and some heaviness 10. MSK: Normal range of motion  body mass index is 24.99 kg/(m^2).   Labs on Admission:    No results found for this or any previous visit (from the past 24 hour(s)).  UA not obtained  No results found for: HGBA1C  CrCl cannot be calculated (Patient has no serum creatinine result on file.).  BNP (last 3 results) No results for input(s): PROBNP in the last 8760 hours.  Other results:  I have pearsonaly reviewed this: ECG REPORT  Rate:91  Rhythm: a.fib ST&T Change: non specific ECG changes   Filed Weights   08/01/14 2253  Weight: 72.4 kg (159 lb 9.8 oz)    Cultures: No results found for: SDES, SPECREQUEST, CULT, REPTSTATUS   Radiological Exams on Admission: CT head showing no  acute intracranial abnormality CT of the neck showing extensive trauma to the cervical spine centered at the level  of C4-C5 with acute fracture for anterior aspect of inferior endplate of C4 with widening of anterior C4-C5 interspace indicative of disruption of anterior longitudinal ligament the large amount of paravertebral soft tissue swelling presumably hematoma and/or edema also fractures. Right C4 lamina extending through spinous process Lumbar spine stay. Degenerative changes remote L2 compression fracture minimal to 11 superior endplate depression age-indeterminate Right hand negative left hand negative Left wrist and right wrist negative Right Heath complete no acute findings   Chart has been reviewed  Assessment/Plan 80 yo M here after a fall resulting in C4-C5 cervical fractures with possible paravertebral hematoma on Coumadin for history of atrial fibrillation with therapeutic INR  Present on Admission:  . C4 cervical fracture - continue c-collar. Obtain MRI of the spine. Given right arm weakness and questionable right leg weakness will obtain his MRI of the brain as well. Discussed case with Dr. Venetia Maxon from neurosurgery. Will observe patient overnight and step down. Frequent neuro checks. Vitamin K administered at [facility] we'll recheck INR. Neurosurgery at this point  did not recommend FFP administration Just for now overnight keep nothing by mouth in case patient deteriorates and may need emergent operative intervention  . CAD in native artery -->  CABG x4 in 1994 - stable continue home medications  . Chronic atrial fibrillation - continue Coreg monitor on telemetry hold Coumadin indefinitely  . Essential hypertension - currently stable continue home medications   LOC after fall - summary secondary to concussion but will cycle cardiac enzymes,  Inguinal pain - plain hip imaging has been negative. If persists consider further imaging Prophylaxis: SCD , Protonix  CODE STATUS:   DNR/DNI  Other plan as per orders.  I have spent a total of 55 min on this admission  Sarea Fyfe 08/01/2014, 11:29 PM  [facility]  Pager [phone]   after 2 AM please page floor coverage PA If 7AM-7PM, please contact the day team taking care of the patient  [email or web address]  Password [email or web address]

## 2014-08-02 ENCOUNTER — Other Ambulatory Visit: Payer: Self-pay

## 2014-08-02 ENCOUNTER — Inpatient Hospital Stay (HOSPITAL_COMMUNITY): Payer: Medicare HMO

## 2014-08-02 DIAGNOSIS — E039 Hypothyroidism, unspecified: Secondary | ICD-10-CM | POA: Diagnosis present

## 2014-08-02 DIAGNOSIS — R29898 Other symptoms and signs involving the musculoskeletal system: Secondary | ICD-10-CM | POA: Insufficient documentation

## 2014-08-02 DIAGNOSIS — R1310 Dysphagia, unspecified: Secondary | ICD-10-CM

## 2014-08-02 DIAGNOSIS — R778 Other specified abnormalities of plasma proteins: Secondary | ICD-10-CM | POA: Diagnosis present

## 2014-08-02 DIAGNOSIS — R7989 Other specified abnormal findings of blood chemistry: Secondary | ICD-10-CM | POA: Diagnosis present

## 2014-08-02 LAB — COMPREHENSIVE METABOLIC PANEL
ALT: 20 U/L (ref 0–53)
AST: 39 U/L — ABNORMAL HIGH (ref 0–37)
Albumin: 3.3 g/dL — ABNORMAL LOW (ref 3.5–5.2)
Alkaline Phosphatase: 114 U/L (ref 39–117)
Anion gap: 15 (ref 5–15)
BUN: 23 mg/dL (ref 6–23)
CALCIUM: 9.2 mg/dL (ref 8.4–10.5)
CO2: 22 meq/L (ref 19–32)
CREATININE: 0.99 mg/dL (ref 0.50–1.35)
Chloride: 104 mEq/L (ref 96–112)
GFR, EST AFRICAN AMERICAN: 78 mL/min — AB (ref >90)
GFR, EST NON AFRICAN AMERICAN: 67 mL/min — AB (ref >90)
GLUCOSE: 131 mg/dL — AB (ref 70–99)
Potassium: 4.4 mEq/L (ref 3.7–5.3)
Sodium: 141 mEq/L (ref 137–147)
Total Bilirubin: 0.8 mg/dL (ref 0.3–1.2)
Total Protein: 7.2 g/dL (ref 6.0–8.3)

## 2014-08-02 LAB — PROTIME-INR
INR: 1.79 — ABNORMAL HIGH (ref 0.00–1.49)
Prothrombin Time: 21 seconds — ABNORMAL HIGH (ref 11.6–15.2)

## 2014-08-02 LAB — CBC WITH DIFFERENTIAL/PLATELET
Basophils Absolute: 0 10*3/uL (ref 0.0–0.1)
Basophils Relative: 0 % (ref 0–1)
EOS PCT: 0 % (ref 0–5)
Eosinophils Absolute: 0 10*3/uL (ref 0.0–0.7)
HCT: 33.7 % — ABNORMAL LOW (ref 39.0–52.0)
HEMOGLOBIN: 11.1 g/dL — AB (ref 13.0–17.0)
LYMPHS ABS: 1.2 10*3/uL (ref 0.7–4.0)
Lymphocytes Relative: 11 % — ABNORMAL LOW (ref 12–46)
MCH: 30.5 pg (ref 26.0–34.0)
MCHC: 32.9 g/dL (ref 30.0–36.0)
MCV: 92.6 fL (ref 78.0–100.0)
MONO ABS: 0.7 10*3/uL (ref 0.1–1.0)
Monocytes Relative: 7 % (ref 3–12)
Neutro Abs: 8.6 10*3/uL — ABNORMAL HIGH (ref 1.7–7.7)
Neutrophils Relative %: 82 % — ABNORMAL HIGH (ref 43–77)
Platelets: 221 10*3/uL (ref 150–400)
RBC: 3.64 MIL/uL — ABNORMAL LOW (ref 4.22–5.81)
RDW: 13.5 % (ref 11.5–15.5)
WBC: 10.6 10*3/uL — ABNORMAL HIGH (ref 4.0–10.5)

## 2014-08-02 LAB — URINE MICROSCOPIC-ADD ON

## 2014-08-02 LAB — URINALYSIS, ROUTINE W REFLEX MICROSCOPIC
BILIRUBIN URINE: NEGATIVE
GLUCOSE, UA: NEGATIVE mg/dL
KETONES UR: 15 mg/dL — AB
LEUKOCYTES UA: NEGATIVE
Nitrite: NEGATIVE
Protein, ur: 30 mg/dL — AB
Specific Gravity, Urine: 1.017 (ref 1.005–1.030)
Urobilinogen, UA: 0.2 mg/dL (ref 0.0–1.0)
pH: 5 (ref 5.0–8.0)

## 2014-08-02 LAB — MAGNESIUM: MAGNESIUM: 1.9 mg/dL (ref 1.5–2.5)

## 2014-08-02 LAB — PHOSPHORUS: PHOSPHORUS: 2.3 mg/dL (ref 2.3–4.6)

## 2014-08-02 LAB — MRSA PCR SCREENING: MRSA by PCR: NEGATIVE

## 2014-08-02 LAB — TSH: TSH: 0.063 u[IU]/mL — AB (ref 0.350–4.500)

## 2014-08-02 LAB — TROPONIN I
TROPONIN I: 0.63 ng/mL — AB (ref <0.30)
Troponin I: 0.65 ng/mL (ref <0.30)
Troponin I: 0.57 ng/mL (ref <0.30)

## 2014-08-02 LAB — TYPE AND SCREEN
ABO/RH(D): O POS
Antibody Screen: NEGATIVE

## 2014-08-02 LAB — ABO/RH: ABO/RH(D): O POS

## 2014-08-02 MED ORDER — MORPHINE SULFATE 2 MG/ML IJ SOLN
1.0000 mg | INTRAMUSCULAR | Status: DC | PRN
  Administered 2014-08-02 – 2014-08-04 (×3): 2 mg via INTRAVENOUS
  Filled 2014-08-02 (×3): qty 1

## 2014-08-02 MED ORDER — SIMVASTATIN 20 MG PO TABS
20.0000 mg | ORAL_TABLET | Freq: Every day | ORAL | Status: DC
  Administered 2014-08-03 – 2014-08-05 (×3): 20 mg via ORAL
  Filled 2014-08-02 (×4): qty 1

## 2014-08-02 MED ORDER — CARVEDILOL 3.125 MG PO TABS
3.1250 mg | ORAL_TABLET | ORAL | Status: AC
  Administered 2014-08-02: 3.125 mg via ORAL
  Filled 2014-08-02: qty 1

## 2014-08-02 MED ORDER — LEVOTHYROXINE SODIUM 100 MCG PO TABS
100.0000 ug | ORAL_TABLET | Freq: Every day | ORAL | Status: DC
  Administered 2014-08-03 – 2014-08-06 (×4): 100 ug via ORAL
  Filled 2014-08-02 (×5): qty 1

## 2014-08-02 MED ORDER — PANTOPRAZOLE SODIUM 40 MG IV SOLR
40.0000 mg | Freq: Every day | INTRAVENOUS | Status: DC
  Administered 2014-08-02: 40 mg via INTRAVENOUS
  Filled 2014-08-02 (×2): qty 40

## 2014-08-02 MED ORDER — METOPROLOL TARTRATE 1 MG/ML IV SOLN
2.5000 mg | Freq: Four times a day (QID) | INTRAVENOUS | Status: DC
  Administered 2014-08-02: 2.5 mg via INTRAVENOUS

## 2014-08-02 MED ORDER — CARVEDILOL 3.125 MG PO TABS
3.1250 mg | ORAL_TABLET | Freq: Two times a day (BID) | ORAL | Status: DC
  Administered 2014-08-03 – 2014-08-06 (×6): 3.125 mg via ORAL
  Filled 2014-08-02 (×9): qty 1

## 2014-08-02 MED ORDER — LEVOTHYROXINE SODIUM 100 MCG IV SOLR: 50.0000 ug | Freq: Every day | INTRAVENOUS | Status: DC

## 2014-08-02 NOTE — Progress Notes (Signed)
The family changed their decision they do not want surgery, they just want to do the 6-8 weeks on collar.

## 2014-08-02 NOTE — Progress Notes (Addendum)
CRITICAL VALUE ALERT  Critical value received:  Troponin 0.63  Date of notification:  08/02/2014   Time of notification:  0217  Critical value read back: yes  Nurse who received alert:  Birdena CrandallAmanda Ramello Cordial RN  MD notified (1st page):  NP Lenny Pastelom Callahan  Time of first page:  0540  MD notified (2nd page):  Time of second page:  Responding MD:    Time MD responded:  [phone]

## 2014-08-02 NOTE — Progress Notes (Signed)
Notified MD Lendell CapriceSullivan re: urine output of 100 ml all day.  Advised to watch BP, no new orders.

## 2014-08-02 NOTE — Progress Notes (Signed)
Chart reviewed.   [facility] PROGRESS NOTE  Jacob LainRamon [name] WJX:[id number] DOB: [birth year] DOA: 08/01/2014 PCP: Aida PufferLITTLE,JAMES, MD  Assessment/Plan:  Principal Problem:   C4-5cervical fracture with perivertebral hematoma: Continue neck brace and immobilization. MRI pending. Coumadin reversed with vitamin K. Continue nothing by mouth until seen by neurosurgery.   wife reports that she would not want patient to have surgery due to his advanced age.  Active Problems:   Chronic atrial fibrillation, rate controlled. No Coumadin or antiplatelet agents in the setting of acute C-spine fracture.   Long term current use of anticoagulant therapy   CAD in native artery -->  CABG x4 in 1994   Dyslipidemia, goal LDL below 70    Essential hypertension   Elevated troponin:  Patient denies chest pain. EKG ordered several times but I don't see that it's been done. Will reorder. Trend troponins. CPK elevated. Will monitor.   Hypothyroidism   Dysphagia:  Nurse is reporting difficulty swallowing pills. Will give meds IV and stop all nonessential medications. We'll get a speech evaluation. Nothing by mouth for now.   Code Status:  DO NOT RESUSCITATE  Family Communication:  Wife at bedside Disposition Plan:  ?  Consultants:   neurosurgery   Procedures:     Antibiotics:    HPI/Subjective: Having pain.   Objective: Filed Vitals:   08/02/14 0840  BP: 118/48  Pulse: 72  Temp: 98.1 F (36.7 C)  Resp: 20    Intake/Output Summary (Last 24 hours) at 08/02/14 0914 Last data filed at 08/02/14 0600  Gross per 24 hour  Intake    300 ml  Output    350 ml  Net    -50 ml   Filed Weights   08/01/14 2253  Weight: 72.4 kg (159 lb 9.8 oz)    Exam:   General:   Asleep.  arousable. Speaks only [place].   HEENT: Hard neck brace in place.   Cardiovascular:  irregularly irregular without murmurs gallops rubs   Respiratory:  clear to auscultation bilaterally without wheezes rhonchi or  rales   Abdomen:  soft nontender nondistended. Bowel sounds present.   Ext:  left fourth finger post amputation. No pedal edema.    neurologic: Cranial nerves intact. Motor strength 5 out of 5 throughout.  Basic Metabolic Panel:  Recent Labs Lab 08/02/14 0106  NA 141  K 4.4  CL 104  CO2 22  GLUCOSE 131*  BUN 23  CREATININE 0.99  CALCIUM 9.2   Liver Function Tests:  Recent Labs Lab 08/02/14 0106  AST 39*  ALT 20  ALKPHOS 114  BILITOT 0.8  PROT 7.2  ALBUMIN 3.3*   No results for input(s): LIPASE, AMYLASE in the last 168 hours. No results for input(s): AMMONIA in the last 168 hours. CBC:  Recent Labs Lab 08/02/14 0106  WBC 10.6*  NEUTROABS 8.6*  HGB 11.1*  HCT 33.7*  MCV 92.6  PLT 221   Cardiac Enzymes:  Recent Labs Lab 08/02/14 0106  TROPONINI 0.63*   BNP (last 3 results) No results for input(s): PROBNP in the last 8760 hours. CBG: No results for input(s): GLUCAP in the last 168 hours.  Recent Results (from the past 240 hour(s))  MRSA PCR Screening     Status: None   Collection Time: 08/01/14 11:26 PM  Result Value Ref Range Status   MRSA by PCR NEGATIVE NEGATIVE Final    Comment:        The GeneXpert MRSA Assay (FDA approved for NASAL specimens only),  is one component of a comprehensive MRSA colonization surveillance program. It is not intended to diagnose MRSA infection nor to guide or monitor treatment for MRSA infections.      Studies: No results found.  Scheduled Meds: . latanoprost  1 drop Both Eyes QHS  . [START ON 08/03/2014] levothyroxine  50 mcg Intravenous Daily  . pantoprazole (PROTONIX) IV  40 mg Intravenous QHS  . sodium chloride  3 mL Intravenous Q12H  . timolol  1 drop Both Eyes Daily   Continuous Infusions: . sodium chloride 50 mL/hr at 08/02/14 0000    Time spent: 35 minutes  Varnell Orvis L  [facility] Text page [email or web address], password [email or web address] 08/02/2014, 9:14 AM  LOS: 1 day

## 2014-08-02 NOTE — Consult Note (Signed)
Reason for Consult: C 45 distraction injury Referring Physician: Bartt Gonzaga is an 80 y.o. male.  HPI: Principal Problem:  C4-5cervical fracture with perivertebral hematoma: Continue neck brace and immobilization. MRI shows cord signal changes and edema along with anterior longitudinal liagament disruption.  CT scan shows C 4 laminar fracture, C 5 spinous process fracture, prevertebrtal hematoma/edema. Coumadin reversed with vitamin K. Patient is having difficulty with speech and swallowing, likely related to prevertebral hematoma, but is not having difficulty with ventilation/airway or difficulty with secretions. wife reports that she would not want patient to have surgery due to his advanced age, however, I discussed situation with daughter at length and indicated concern about spinal cord injury and cervical instability due to ligamentous injury and posterior element disruption. His brain MRI is negative for acute process.  has a past medical history of CAD in native artery --> (1994); S/P CABG x 4 (1994); Chronic atrial fibrillation (12/15/2012); Long term (current) use of anticoagulants (12/15/2012); Essential hypertension (05/23/2013); Dyslipidemia, goal LDL below 70[272.4]; and Glaucoma.   Presented with  A fall into a ditch while walking. He saw a car and tried to get out of the way and slipped into a ditch. For 2 hours he was missing. Finally he was found and brought to [facility]. Patient endorses transiently losing consciousness after his fall. Denies any chest pain. In ER he was found to have C4-C5 fracture with possible perivertibral hematoma.      Active Problems:  Chronic atrial fibrillation, rate controlled. No Coumadin or antiplatelet agents in the setting of acute C-spine fracture.  Long term current use of anticoagulant therapy  CAD in native artery --> CABG x4 in 1994  Dyslipidemia, goal LDL below 70   Essential hypertension  Elevated troponin:  Patient denies chest pain. EKG ordered several times but I don't see that it's been done. Will reorder. Trend troponins. CPK elevated. Will monitor.  Hypothyroidism  Dysphagia: Nurse is reporting difficulty swallowing pills. Will give meds IV and stop all nonessential medications. We'll get a speech evaluation. Nothing by mouth for now.    Past Medical History  Diagnosis Date  . CAD in native artery -->  1994    referred for CABG  . S/P CABG x 4 1994    a) 1994: LIMA-LAD, SVG-RI, SVG-OM, SVG-RCA; b) 09/2006, Persantine Myoview: fixed inferior defect consistent with infarct/scar without peri-infarct ischemia;; 2-D echo: Mild-mod concentric LVH. Normal EF.  Abnormal relaxation, aortic sclerosis with mild-mod AI  . Chronic atrial fibrillation 12/15/2012  . Long term (current) use of anticoagulants 12/15/2012    warfarin  . Essential hypertension 05/23/2013  . Dyslipidemia, goal LDL below 70[272.4]      - on statin, followed by primary physician.   Marland Kitchen Glaucoma     Past Surgical History  Procedure Laterality Date  . Coronary artery bypass graft  1994    LIMA-LAD, SVG-RI, SVG-OM, SVG-RCA   . Persantine myoview  February 2008    Fixed inferior wall defect-consistent with infarct/scar without peri-infarct ischemia  . Transthoracic echocardiogram  February 2008    Mild/mod conc LVH, Nl Size & Fxn, Gr 1 DD(abnormal relaxation), mild to moderate aortic regurgitation and mildly sclerotic aortic valve.    Family History  Problem Relation Age of Onset  . Liver cancer Mother   . Prostate cancer Father     Social History:  reports that he has never smoked. He does not have any smokeless tobacco history on file. He reports that he does  not drink alcohol or use illicit drugs.  Allergies: No Known Allergies  Medications: I have reviewed the patient's current medications.  Results for orders placed or performed during the hospital encounter of 08/01/14 (from the past 48 hour(s))  MRSA PCR  Screening     Status: None   Collection Time: 08/01/14 11:26 PM  Result Value Ref Range   MRSA by PCR NEGATIVE NEGATIVE    Comment:        The GeneXpert MRSA Assay (FDA approved for NASAL specimens only), is one component of a comprehensive MRSA colonization surveillance program. It is not intended to diagnose MRSA infection nor to guide or monitor treatment for MRSA infections.   CBC with Differential     Status: Abnormal   Collection Time: 08/02/14  1:06 AM  Result Value Ref Range   WBC 10.6 (H) 4.0 - 10.5 K/uL   RBC 3.64 (L) 4.22 - 5.81 MIL/uL   Hemoglobin 11.1 (L) 13.0 - 17.0 g/dL   HCT 33.7 (L) 39.0 - 52.0 %   MCV 92.6 78.0 - 100.0 fL   MCH 30.5 26.0 - 34.0 pg   MCHC 32.9 30.0 - 36.0 g/dL   RDW 13.5 11.5 - 15.5 %   Platelets 221 150 - 400 K/uL   Neutrophils Relative % 82 (H) 43 - 77 %   Neutro Abs 8.6 (H) 1.7 - 7.7 K/uL   Lymphocytes Relative 11 (L) 12 - 46 %   Lymphs Abs 1.2 0.7 - 4.0 K/uL   Monocytes Relative 7 3 - 12 %   Monocytes Absolute 0.7 0.1 - 1.0 K/uL   Eosinophils Relative 0 0 - 5 %   Eosinophils Absolute 0.0 0.0 - 0.7 K/uL   Basophils Relative 0 0 - 1 %   Basophils Absolute 0.0 0.0 - 0.1 K/uL  Comprehensive metabolic panel     Status: Abnormal   Collection Time: 08/02/14  1:06 AM  Result Value Ref Range   Sodium 141 137 - 147 mEq/L   Potassium 4.4 3.7 - 5.3 mEq/L   Chloride 104 96 - 112 mEq/L   CO2 22 19 - 32 mEq/L   Glucose, Bld 131 (H) 70 - 99 mg/dL   BUN 23 6 - 23 mg/dL   Creatinine, Ser 0.99 0.50 - 1.35 mg/dL   Calcium 9.2 8.4 - 10.5 mg/dL   Total Protein 7.2 6.0 - 8.3 g/dL   Albumin 3.3 (L) 3.5 - 5.2 g/dL   AST 39 (H) 0 - 37 U/L   ALT 20 0 - 53 U/L   Alkaline Phosphatase 114 39 - 117 U/L   Total Bilirubin 0.8 0.3 - 1.2 mg/dL   GFR calc non Af Amer 67 (L) >90 mL/min   GFR calc Af Amer 78 (L) >90 mL/min    Comment: (NOTE) The eGFR has been calculated using the CKD EPI equation. This calculation has not been validated in all clinical  situations. eGFR's persistently <90 mL/min signify possible Chronic Kidney Disease.    Anion gap 15 5 - 15  Type and screen     Status: None   Collection Time: 08/02/14  1:06 AM  Result Value Ref Range   ABO/RH(D) O POS    Antibody Screen NEG    Sample Expiration 08/05/2014   Protime-INR     Status: Abnormal   Collection Time: 08/02/14  1:06 AM  Result Value Ref Range   Prothrombin Time 21.0 (H) 11.6 - 15.2 seconds   INR 1.79 (H) 0.00 - 1.49  Troponin I (q 6hr x 3)     Status: Abnormal   Collection Time: 08/02/14  1:06 AM  Result Value Ref Range   Troponin I 0.63 (HH) <0.30 ng/mL    Comment:        Due to the release kinetics of cTnI, a negative result within the first hours of the onset of symptoms does not rule out myocardial infarction with certainty. If myocardial infarction is still suspected, repeat the test at appropriate intervals. CRITICAL RESULT CALLED TO, READ BACK BY AND VERIFIED WITH: PETTIFORD,A RN 08/02/2014 0213 JORDANS   ABO/Rh     Status: None (Preliminary result)   Collection Time: 08/02/14  1:06 AM  Result Value Ref Range   ABO/RH(D) O POS   Urinalysis, Routine w reflex microscopic     Status: Abnormal   Collection Time: 08/02/14  4:25 AM  Result Value Ref Range   Color, Urine YELLOW YELLOW   APPearance CLOUDY (A) CLEAR   Specific Gravity, Urine 1.017 1.005 - 1.030   pH 5.0 5.0 - 8.0   Glucose, UA NEGATIVE NEGATIVE mg/dL   Hgb urine dipstick MODERATE (A) NEGATIVE   Bilirubin Urine NEGATIVE NEGATIVE   Ketones, ur 15 (A) NEGATIVE mg/dL   Protein, ur 30 (A) NEGATIVE mg/dL   Urobilinogen, UA 0.2 0.0 - 1.0 mg/dL   Nitrite NEGATIVE NEGATIVE   Leukocytes, UA NEGATIVE NEGATIVE  Urine microscopic-add on     Status: None   Collection Time: 08/02/14  4:25 AM  Result Value Ref Range   Squamous Epithelial / LPF RARE RARE   RBC / HPF 3-6 <3 RBC/hpf   Bacteria, UA RARE RARE  Magnesium     Status: None   Collection Time: 08/02/14  7:51 AM  Result Value Ref  Range   Magnesium 1.9 1.5 - 2.5 mg/dL  Phosphorus     Status: None   Collection Time: 08/02/14  7:51 AM  Result Value Ref Range   Phosphorus 2.3 2.3 - 4.6 mg/dL  Troponin I (q 6hr x 3)     Status: Abnormal   Collection Time: 08/02/14  7:51 AM  Result Value Ref Range   Troponin I 0.65 (HH) <0.30 ng/mL    Comment:        Due to the release kinetics of cTnI, a negative result within the first hours of the onset of symptoms does not rule out myocardial infarction with certainty. If myocardial infarction is still suspected, repeat the test at appropriate intervals. CRITICAL VALUE NOTED.  VALUE IS CONSISTENT WITH PREVIOUSLY REPORTED AND CALLED VALUE.   TSH     Status: Abnormal   Collection Time: 08/02/14  7:52 AM  Result Value Ref Range   TSH 0.063 (L) 0.350 - 4.500 uIU/mL    Mr Brain Wo Contrast  08/02/2014   CLINICAL DATA:  [place] into a ditch while walking yesterday. Loss of consciousness. Neck pain. Initial encounter.  EXAM: MRI HEAD WITHOUT CONTRAST  MRI CERVICAL SPINE WITHOUT CONTRAST  TECHNIQUE: Multiplanar, multiecho pulse sequences of the brain and surrounding structures, and cervical spine, to include the craniocervical junction and cervicothoracic junction, were obtained without intravenous contrast.  COMPARISON:  CT head and cervical spine 08/01/2014.  FINDINGS: MRI HEAD FINDINGS  No evidence for acute infarction, hemorrhage, mass lesion, hydrocephalus, or extra-axial fluid. Generalized cerebral and cerebellar atrophy. Advanced T2 and FLAIR hyperintensity throughout the periventricular and subcortical white matter representing chronic microvascular ischemic change. No midline abnormality. Tiny microbleeds are seen throughout the parenchyma, likely sequelae of  hypertensive cerebrovascular disease, particularly in the setting of dilated perivascular spaces and small vessel disease. No occult subdural or epidural hematoma.  MRI CERVICAL SPINE FINDINGS  Alignment: No subluxation, although  there is abnormal widening of the C4-5 disc space related to fracture.  Vertebrae: The C4 anterior inferior corner fracture is redemonstrated with slight bone marrow edema. No compression deformity. Posterior element fractures on the RIGHT at C4 and C5 are redemonstrated.  Cord: Abnormal cord signal at C3-4 and C4-5 without intramedullary hemorrhage, consistent with cord contusion/central cord syndrome.  Posterior Fossa: Unremarkable.  Vertebral Arteries: Equal in size and patent.  Paraspinal tissues: Dorsally, there is significant edema likely representing interspinous ligament strain or disruption. Paravertebral musculature are edematous on the RIGHT and LEFT most notably at the C4 and C5 levels likely representing strain.  Disc levels:  The individual disc spaces were examined as follows:  C2-3: Disc space narrowing with shallow central disc osteophyte complex. No impingement.  C3-4: Disc space narrowing. BILATERAL facet arthropathy and uncinate spurring. Central disc osteophyte complex. Mild ossification posterior longitudinal ligament. BILATERAL C4 nerve root impingement due to foraminal narrowing. Spinal canal adequate with sagittal diameter of 9 mm although slight effacement anterior subarachnoid space noted.  C4-5: Asymmetric LEFT-sided uncinate spurring and facet arthropathy. Shallow central disc protrusion. Posterior element indentation of the dorsal subarachnoid space. Mild canal stenosis with diameter 7 mm. Minimal cord flattening.  C5-6: Severe disc space narrowing. BILATERAL uncinate spurring. Shallow central disc osteophyte complex without cord compression. BILATERAL foraminal narrowing.  C6-7: BILATERAL uncinate spurring. Central ossification of the posterior longitudinal ligament. No definite cord compression. RIGHT greater than LEFT C7 nerve root impingement in the foramen is suspected.  C7-T1:  Disc space narrowing.  Facet arthropathy.  No impingement.  IMPRESSION: No acute intracranial findings.  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  Traumatic disruption of the C4-5 disc space, anterior inferior corner fracture of C4, and posterior element fractures of C4-C5 are redemonstrated.  Mildly increased T2 signal within the cord, maximal at C4-5, best visualized on STIR imaging, without associated blood products. Findings consistent with cord contusion/central cord syndrome. No visible epidural hematoma.  Mild canal stenosis is worst at C4-5, multifactorial in nature, with canal diameter of approximately 7 mm.  Edema in the posterior soft tissues relates to muscular strain and probable injury of the interspinous ligaments. No traumatic subluxation is evident however.  Multilevel spondylosis elsewhere as described.   Electronically Signed   By: Rolla Flatten M.D.   On: 08/02/2014 13:17   Mr Cervical Spine Wo Contrast  08/02/2014   CLINICAL DATA:  [place] into a ditch while walking yesterday. Loss of consciousness. Neck pain. Initial encounter.  EXAM: MRI HEAD WITHOUT CONTRAST  MRI CERVICAL SPINE WITHOUT CONTRAST  TECHNIQUE: Multiplanar, multiecho pulse sequences of the brain and surrounding structures, and cervical spine, to include the craniocervical junction and cervicothoracic junction, were obtained without intravenous contrast.  COMPARISON:  CT head and cervical spine 08/01/2014.  FINDINGS: MRI HEAD FINDINGS  No evidence for acute infarction, hemorrhage, mass lesion, hydrocephalus, or extra-axial fluid. Generalized cerebral and cerebellar atrophy. Advanced T2 and FLAIR hyperintensity throughout the periventricular and subcortical white matter representing chronic microvascular ischemic change. No midline abnormality. Tiny microbleeds are seen throughout the parenchyma, likely sequelae of hypertensive cerebrovascular disease, particularly in the setting of dilated perivascular spaces and small vessel disease. No occult subdural or epidural hematoma.  MRI CERVICAL SPINE FINDINGS   Alignment: No subluxation, although there is abnormal widening  of the C4-5 disc space related to fracture.  Vertebrae: The C4 anterior inferior corner fracture is redemonstrated with slight bone marrow edema. No compression deformity. Posterior element fractures on the RIGHT at C4 and C5 are redemonstrated.  Cord: Abnormal cord signal at C3-4 and C4-5 without intramedullary hemorrhage, consistent with cord contusion/central cord syndrome.  Posterior Fossa: Unremarkable.  Vertebral Arteries: Equal in size and patent.  Paraspinal tissues: Dorsally, there is significant edema likely representing interspinous ligament strain or disruption. Paravertebral musculature are edematous on the RIGHT and LEFT most notably at the C4 and C5 levels likely representing strain.  Disc levels:  The individual disc spaces were examined as follows:  C2-3: Disc space narrowing with shallow central disc osteophyte complex. No impingement.  C3-4: Disc space narrowing. BILATERAL facet arthropathy and uncinate spurring. Central disc osteophyte complex. Mild ossification posterior longitudinal ligament. BILATERAL C4 nerve root impingement due to foraminal narrowing. Spinal canal adequate with sagittal diameter of 9 mm although slight effacement anterior subarachnoid space noted.  C4-5: Asymmetric LEFT-sided uncinate spurring and facet arthropathy. Shallow central disc protrusion. Posterior element indentation of the dorsal subarachnoid space. Mild canal stenosis with diameter 7 mm. Minimal cord flattening.  C5-6: Severe disc space narrowing. BILATERAL uncinate spurring. Shallow central disc osteophyte complex without cord compression. BILATERAL foraminal narrowing.  C6-7: BILATERAL uncinate spurring. Central ossification of the posterior longitudinal ligament. No definite cord compression. RIGHT greater than LEFT C7 nerve root impingement in the foramen is suspected.  C7-T1:  Disc space narrowing.  Facet arthropathy.  No impingement.   IMPRESSION: No acute intracranial findings. 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  Traumatic disruption of the C4-5 disc space, anterior inferior corner fracture of C4, and posterior element fractures of C4-C5 are redemonstrated.  Mildly increased T2 signal within the cord, maximal at C4-5, best visualized on STIR imaging, without associated blood products. Findings consistent with cord contusion/central cord syndrome. No visible epidural hematoma.  Mild canal stenosis is worst at C4-5, multifactorial in nature, with canal diameter of approximately 7 mm.  Edema in the posterior soft tissues relates to muscular strain and probable injury of the interspinous ligaments. No traumatic subluxation is evident however.  Multilevel spondylosis elsewhere as described.   Electronically Signed   By: Rolla Flatten M.D.   On: 08/02/2014 13:17    Review of Systems - Negative except as above.  Atrial fibrillation on chronic coumadin    Blood pressure 127/49, pulse 64, temperature 97.6 F (36.4 C), temperature source Oral, resp. rate 20, weight 72.4 kg (159 lb 9.8 oz), SpO2 96 %. Physical Exam  Constitutional: He is oriented to person, place, and time. He appears well-developed and well-nourished.  HENT:  Head: Normocephalic and atraumatic.  Eyes: Conjunctivae are normal. Pupils are equal, round, and reactive to light.  Neck: No tracheal deviation present.  Immobilized in cervical collar.  Some neck pain.  Respiratory: Effort normal and breath sounds normal. No stridor.  GI: Soft. Bowel sounds are normal.  Neurological: He is alert and oriented to person, place, and time. He has normal reflexes. He displays normal reflexes. No cranial nerve deficit. Coordination normal. GCS eye subscore is 4. GCS verbal subscore is 5. GCS motor subscore is 6.  Weakness in right deltoid at 4/5; right biceps at 4/5, right triceps at 4-/5, right hand intrinsics at 3/5; left hand intrinsics at 4-/5   Skin: Skin is warm and dry.  Psychiatric: He has a normal mood and affect. His behavior is normal.  Judgment and thought content normal.  He has weakness in his right arm and notes numbness on the right side of his body compared to the left  Assessment/Plan: The patient has a spinal cord injury and fracture due to a fall.  He has weakness in his right arm and notes numbness on the right side of his body compared to the left.  He has a ligamentous injury of C 45 with C 4 and C 5 posterior element fractures.  He has a prevertebral hematoma with dysphagia.  He has been on coumadin for Atrial Fibrillation.  I am concerned about potential instability of the patient's neck, given the disruption of the ALL and disc space widening at the C 45 level.  I spent 60 minutes in direct patient care discussing the situation with the wife, patient, and daughter, who served as a Optometrist.  They understand that he has a spinal cord injury and is at risk for additional spinal cord injury/death.  They also understand that surgical treatment to stabilize his cervical spine carries risks also including death.  They wish to discuss medical and anesthetic risks and understand that he needs to remain in his cervical collar.  I do not think there is a role for continuing coumadin in this patient.  I discussed the situation with Dr. Conley Canal as well.  The options for conservative management would include wearing the collar and seeing if his neck heals, likely over 8 weeks.  The surgical treatment would consist of ACDF at the C 45 level.  Peggyann Shoals, MD 08/02/2014, 1:26 PM

## 2014-08-02 NOTE — Evaluation (Signed)
Clinical/Bedside Swallow Evaluation Patient Details  Name: Jacob [name] MRN: [id number] Date of Birth: August 04, 1917  Today's Date: 08/02/2014 Time: 0315-0335 SLP Time Calculation (min) (ACUTE ONLY): 20 min  Past Medical History:  Past Medical History  Diagnosis Date  . CAD in native artery -->  1994    referred for CABG  . S/P CABG x 4 1994    a) 1994: LIMA-LAD, SVG-RI, SVG-OM, SVG-RCA; b) 09/2006, Persantine Myoview: fixed inferior defect consistent with infarct/scar without peri-infarct ischemia;; 2-D echo: Mild-mod concentric LVH. Normal EF.  Abnormal relaxation, aortic sclerosis with mild-mod AI  . Chronic atrial fibrillation 12/15/2012  . Long term (current) use of anticoagulants 12/15/2012    warfarin  . Essential hypertension 05/23/2013  . Dyslipidemia, goal LDL below 70[272.4]      - on statin, followed by primary physician.   Marland Kitchen. Glaucoma    Past Surgical History:  Past Surgical History  Procedure Laterality Date  . Coronary artery bypass graft  1994    LIMA-LAD, SVG-RI, SVG-OM, SVG-RCA   . Persantine myoview  February 2008    Fixed inferior wall defect-consistent with infarct/scar without peri-infarct ischemia  . Transthoracic echocardiogram  February 2008    Mild/mod conc LVH, Nl Size & Fxn, Gr 1 DD(abnormal relaxation), mild to moderate aortic regurgitation and mildly sclerotic aortic valve.   HPI:  Jacob [name] is a 80 y.o. male with a pmhx significant for CAD, s/p CABG x4 (1994), Chronic atrial fibrillation (11/2012), long term use of anticoagulants, essential hypertension, dyslipidemia, glaucoma who was brought to ER on 12/4 and found to have C4-C5 fracture with possible perivertibral hematoma; BSE ordered to r/o aspiration d/t injury.    Assessment / Plan / Recommendation Clinical Impression   Pt with overt s/s of aspiration including delayed cough and multiple swallows and throat clearing with varying volumes of thin liquids from spoon-straw sips d/t probable pharyngeal  edema and C4-5 fx; pt able to swallow nectar-thickened liquids with varying volumes without difficulty including straw sips; puree without indications of aspiration; solids with delayed cough x1 after presentation; recommend Dysphagia 3/nectar-thickened liquids and possible MBS prn after skilled dysphagia tx with meal observation    Aspiration Risk  Mild    Diet Recommendation Dysphagia 3 (Mechanical Soft);Nectar-thick liquid   Liquid Administration via: Straw;Cup Medication Administration: Crushed with puree Supervision: Staff to assist with self feeding Compensations: Slow rate;Small sips/bites;Follow solids with liquid Postural Changes and/or Swallow Maneuvers: Seated upright 90 degrees    Other  Recommendations Recommended Consults: MBS;Other (Comment) (if indicated) Other Recommendations: Order thickener from pharmacy   Follow Up Recommendations  Home health SLP;Outpatient SLP;Other (comment) (prn)    Frequency and Duration min 2x/week  2 weeks   Pertinent Vitals/Pain WDL   SLP Swallow Goals  see POC   Swallow Study Prior Functional Status   lives at home; independent with ADL's per daughter    General Date of Onset: 08/01/14 HPI: Jacob [name] is a 80 y.o. male with a pmhx significant for CAD, s/p CABG x4 (1994), Chronic atrial fibrillation (11/2012), long term use of anticoagulants, essential hypertension, dyslipidemia, glaucoma who was brought to ER on 12/4 and found to have C4-C5 fracture with possible perivertibral hematoma; BSE ordered to r/o aspiration d/t injury.  Type of Study: Bedside swallow evaluation Previous Swallow Assessment: na Diet Prior to this Study: Thin liquids Temperature Spikes Noted: No Respiratory Status: Room air History of Recent Intubation: No Behavior/Cognition: Alert;Cooperative;Distractible Oral Cavity - Dentition: Dentures, top;Missing dentition;Dentures, bottom Self-Feeding Abilities: Needs assist;Needs set  up Patient Positioning:  Upright in bed Baseline Vocal Quality: Clear;Low vocal intensity Volitional Cough: Strong Volitional Swallow: Able to elicit    Oral/Motor/Sensory Function Overall Oral Motor/Sensory Function: Appears within functional limits for tasks assessed Labial ROM: Within Functional Limits Labial Symmetry: Within Functional Limits Labial Strength: Within Functional Limits Labial Sensation: Within Functional Limits Lingual ROM: Within Functional Limits Lingual Symmetry: Within Functional Limits Lingual Strength: Within Functional Limits Lingual Sensation: Within Functional Limits Facial ROM: Within Functional Limits Facial Symmetry: Within Functional Limits Facial Strength: Within Functional Limits Facial Sensation: Within Functional Limits Velum: Within Functional Limits Mandible: Within Functional Limits   Ice Chips Ice chips: Within functional limits Presentation: Spoon   Thin Liquid Thin Liquid: Impaired Presentation: Cup;Straw Pharyngeal  Phase Impairments: Multiple swallows;Cough - Delayed;Throat Clearing - Immediate    Nectar Thick Nectar Thick Liquid: Within functional limits Presentation: Cup;Straw   Honey Thick Honey Thick Liquid: Not tested   Puree Puree: Within functional limits Presentation: Spoon   Solid       Solid: Impaired Presentation: Spoon Pharyngeal Phase Impairments: Multiple swallows;Throat Clearing - Delayed       Kelse Ploch,PAT, M.S., CCC-SLP 08/02/2014,4:15 PM

## 2014-08-02 NOTE — Progress Notes (Signed)
Orthopedic Tech Progress Note Patient Details:  Jacob LainRamon [name] 1917-04-07 [id number] Applied Vista cervical collar Ortho Devices Type of Ortho Device: Other (comment) Ortho Device/Splint Interventions: Application   Lesle ChrisGilliland, Roselene Gray L 08/02/2014, 6:40 PM

## 2014-08-03 LAB — URINE CULTURE: Colony Count: 7000

## 2014-08-03 LAB — CK: CK TOTAL: 449 U/L — AB (ref 7–232)

## 2014-08-03 LAB — PROTIME-INR
INR: 1.33 (ref 0.00–1.49)
Prothrombin Time: 16.6 seconds — ABNORMAL HIGH (ref 11.6–15.2)

## 2014-08-03 MED ORDER — LISINOPRIL 20 MG PO TABS
20.0000 mg | ORAL_TABLET | Freq: Every morning | ORAL | Status: DC
  Administered 2014-08-03 – 2014-08-06 (×4): 20 mg via ORAL
  Filled 2014-08-03 (×4): qty 1

## 2014-08-03 MED ORDER — STARCH (THICKENING) PO POWD
ORAL | Status: AC | PRN
  Filled 2014-08-03: qty 227

## 2014-08-03 NOTE — Progress Notes (Signed)
[facility]

## 2014-08-03 NOTE — Progress Notes (Addendum)
Chart reviewed.   [facility] PROGRESS NOTE  Jacob LainRamon [name] ZOX:[id number] DOB: [birth year] DOA: 08/01/2014 PCP: Aida PufferLITTLE,JAMES, MD  Assessment/Plan:  Principal Problem:   C4-5cervical fracture with cord contusion and probable ligamentous injury: Discussed with Dr. Venetia MaxonStern. Neck brace changed per his recommendations (vista). Would stop Coumadin indefinitely. Patient and family have decided against surgery. Will transfer to the floor. Await physical therapy and occupational therapy evaluations. Will likely need skilled nursing facility placement. Patient's wife reports she will be unable to care for him in this state. SCDs for DVT prophylaxis. Prognosis guarded. Active Problems:   Chronic atrial fibrillation, rate controlled. No Coumadin or antiplatelet agents in the setting of acute C-spine fracture.   CAD in native artery -->  CABG x4 in 1994   Dyslipidemia, goal LDL below 70    Essential hypertension: resume outpatient meds   Elevated troponin:  EKG without acute ischemic changes and troponin trend is flat. No chest pain. Doubt acute coronary syndrome. Will defer further workup given patient's lack of symptoms and advanced age.   Hypothyroidism:  TSH low. Will adjust Synthroid.   Dysphagia secondary to C-spine injury and swelling: Speech therapy has recommended a dysphagia 3 diet with nectar thickened liquids. Patient seems to be tolerating.   Code Status:  DO NOT RESUSCITATE  Family Communication:  Wife at bedside Disposition Plan:  Likely skilled nursing facility  Consultants:   neurosurgery   Procedures:     Antibiotics:    HPI/Subjective: Wants to go home  Objective: Filed Vitals:   08/03/14 0800  BP: 172/74  Pulse: 82  Temp:   Resp: 20    Intake/Output Summary (Last 24 hours) at 08/03/14 0834 Last data filed at 08/02/14 1800  Gross per 24 hour  Intake    123 ml  Output    100 ml  Net     23 ml   Filed Weights   08/01/14 2253 08/03/14 0442  Weight: 72.4  kg (159 lb 9.8 oz) 72.2 kg (159 lb 2.8 oz)    Exam:   General:   Asleep.  arousable. Speaks only [place].   HEENT: neck brace  Cardiovascular:  irregularly irregular without murmurs gallops rubs   Respiratory:  clear to auscultation bilaterally without wheezes rhonchi or rales   Abdomen:  soft nontender nondistended. Bowel sounds present.   Ext:  left fourth finger post amputation. No pedal edema.    neurologic: Cranial nerves intact. Motor strength 5 out of 5 throughout.  Basic Metabolic Panel:  Recent Labs Lab 08/02/14 0106 08/02/14 0751  NA 141  --   K 4.4  --   CL 104  --   CO2 22  --   GLUCOSE 131*  --   BUN 23  --   CREATININE 0.99  --   CALCIUM 9.2  --   MG  --  1.9  PHOS  --  2.3   Liver Function Tests:  Recent Labs Lab 08/02/14 0106  AST 39*  ALT 20  ALKPHOS 114  BILITOT 0.8  PROT 7.2  ALBUMIN 3.3*   No results for input(s): LIPASE, AMYLASE in the last 168 hours. No results for input(s): AMMONIA in the last 168 hours. CBC:  Recent Labs Lab 08/02/14 0106  WBC 10.6*  NEUTROABS 8.6*  HGB 11.1*  HCT 33.7*  MCV 92.6  PLT 221   Cardiac Enzymes:  Recent Labs Lab 08/02/14 0106 08/02/14 0751 08/02/14 1316 08/03/14 0409  CKTOTAL  --   --   --  449*  TROPONINI 0.63* 0.65* 0.57*  --    BNP (last 3 results) No results for input(s): PROBNP in the last 8760 hours. CBG: No results for input(s): GLUCAP in the last 168 hours.  Recent Results (from the past 240 hour(s))  MRSA PCR Screening     Status: None   Collection Time: 08/01/14 11:26 PM  Result Value Ref Range Status   MRSA by PCR NEGATIVE NEGATIVE Final    Comment:        The GeneXpert MRSA Assay (FDA approved for NASAL specimens only), is one component of a comprehensive MRSA colonization surveillance program. It is not intended to diagnose MRSA infection nor to guide or monitor treatment for MRSA infections.      Studies: Mr Sherrin Daisy Contrast  08/02/2014   CLINICAL  DATA:  Larey Seat into a ditch while walking yesterday. Loss of consciousness. Neck pain. Initial encounter.  EXAM: MRI HEAD WITHOUT CONTRAST  MRI CERVICAL SPINE WITHOUT CONTRAST  TECHNIQUE: Multiplanar, multiecho pulse sequences of the brain and surrounding structures, and cervical spine, to include the craniocervical junction and cervicothoracic junction, were obtained without intravenous contrast.  COMPARISON:  CT head and cervical spine 08/01/2014.  FINDINGS: MRI HEAD FINDINGS  No evidence for acute infarction, hemorrhage, mass lesion, hydrocephalus, or extra-axial fluid. Generalized cerebral and cerebellar atrophy. Advanced T2 and FLAIR hyperintensity throughout the periventricular and subcortical white matter representing chronic microvascular ischemic change. No midline abnormality. Tiny microbleeds are seen throughout the parenchyma, likely sequelae of hypertensive cerebrovascular disease, particularly in the setting of dilated perivascular spaces and small vessel disease. No occult subdural or epidural hematoma.  MRI CERVICAL SPINE FINDINGS  Alignment: No subluxation, although there is abnormal widening of the C4-5 disc space related to fracture.  Vertebrae: The C4 anterior inferior corner fracture is redemonstrated with slight bone marrow edema. No compression deformity. Posterior element fractures on the RIGHT at C4 and C5 are redemonstrated.  Cord: Abnormal cord signal at C3-4 and C4-5 without intramedullary hemorrhage, consistent with cord contusion/central cord syndrome.  Posterior Fossa: Unremarkable.  Vertebral Arteries: Equal in size and patent.  Paraspinal tissues: Dorsally, there is significant edema likely representing interspinous ligament strain or disruption. Paravertebral musculature are edematous on the RIGHT and LEFT most notably at the C4 and C5 levels likely representing strain.  Disc levels:  The individual disc spaces were examined as follows:  C2-3: Disc space narrowing with shallow central  disc osteophyte complex. No impingement.  C3-4: Disc space narrowing. BILATERAL facet arthropathy and uncinate spurring. Central disc osteophyte complex. Mild ossification posterior longitudinal ligament. BILATERAL C4 nerve root impingement due to foraminal narrowing. Spinal canal adequate with sagittal diameter of 9 mm although slight effacement anterior subarachnoid space noted.  C4-5: Asymmetric LEFT-sided uncinate spurring and facet arthropathy. Shallow central disc protrusion. Posterior element indentation of the dorsal subarachnoid space. Mild canal stenosis with diameter 7 mm. Minimal cord flattening.  C5-6: Severe disc space narrowing. BILATERAL uncinate spurring. Shallow central disc osteophyte complex without cord compression. BILATERAL foraminal narrowing.  C6-7: BILATERAL uncinate spurring. Central ossification of the posterior longitudinal ligament. No definite cord compression. RIGHT greater than LEFT C7 nerve root impingement in the foramen is suspected.  C7-T1:  Disc space narrowing.  Facet arthropathy.  No impingement.  IMPRESSION: No acute intracranial findings. 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  Traumatic disruption of the C4-5 disc space, anterior inferior corner fracture of C4, and posterior element fractures of C4-C5 are redemonstrated.  Mildly increased T2 signal within the  cord, maximal at C4-5, best visualized on STIR imaging, without associated blood products. Findings consistent with cord contusion/central cord syndrome. No visible epidural hematoma.  Mild canal stenosis is worst at C4-5, multifactorial in nature, with canal diameter of approximately 7 mm.  Edema in the posterior soft tissues relates to muscular strain and probable injury of the interspinous ligaments. No traumatic subluxation is evident however.  Multilevel spondylosis elsewhere as described.   Electronically Signed   By: Davonna BellingJohn  Curnes M.D.   On: 08/02/2014 13:17   Mr Cervical Spine  Wo Contrast  08/02/2014   CLINICAL DATA:  Larey SeatFell into a ditch while walking yesterday. Loss of consciousness. Neck pain. Initial encounter.  EXAM: MRI HEAD WITHOUT CONTRAST  MRI CERVICAL SPINE WITHOUT CONTRAST  TECHNIQUE: Multiplanar, multiecho pulse sequences of the brain and surrounding structures, and cervical spine, to include the craniocervical junction and cervicothoracic junction, were obtained without intravenous contrast.  COMPARISON:  CT head and cervical spine 08/01/2014.  FINDINGS: MRI HEAD FINDINGS  No evidence for acute infarction, hemorrhage, mass lesion, hydrocephalus, or extra-axial fluid. Generalized cerebral and cerebellar atrophy. Advanced T2 and FLAIR hyperintensity throughout the periventricular and subcortical white matter representing chronic microvascular ischemic change. No midline abnormality. Tiny microbleeds are seen throughout the parenchyma, likely sequelae of hypertensive cerebrovascular disease, particularly in the setting of dilated perivascular spaces and small vessel disease. No occult subdural or epidural hematoma.  MRI CERVICAL SPINE FINDINGS  Alignment: No subluxation, although there is abnormal widening of the C4-5 disc space related to fracture.  Vertebrae: The C4 anterior inferior corner fracture is redemonstrated with slight bone marrow edema. No compression deformity. Posterior element fractures on the RIGHT at C4 and C5 are redemonstrated.  Cord: Abnormal cord signal at C3-4 and C4-5 without intramedullary hemorrhage, consistent with cord contusion/central cord syndrome.  Posterior Fossa: Unremarkable.  Vertebral Arteries: Equal in size and patent.  Paraspinal tissues: Dorsally, there is significant edema likely representing interspinous ligament strain or disruption. Paravertebral musculature are edematous on the RIGHT and LEFT most notably at the C4 and C5 levels likely representing strain.  Disc levels:  The individual disc spaces were examined as follows:  C2-3: Disc  space narrowing with shallow central disc osteophyte complex. No impingement.  C3-4: Disc space narrowing. BILATERAL facet arthropathy and uncinate spurring. Central disc osteophyte complex. Mild ossification posterior longitudinal ligament. BILATERAL C4 nerve root impingement due to foraminal narrowing. Spinal canal adequate with sagittal diameter of 9 mm although slight effacement anterior subarachnoid space noted.  C4-5: Asymmetric LEFT-sided uncinate spurring and facet arthropathy. Shallow central disc protrusion. Posterior element indentation of the dorsal subarachnoid space. Mild canal stenosis with diameter 7 mm. Minimal cord flattening.  C5-6: Severe disc space narrowing. BILATERAL uncinate spurring. Shallow central disc osteophyte complex without cord compression. BILATERAL foraminal narrowing.  C6-7: BILATERAL uncinate spurring. Central ossification of the posterior longitudinal ligament. No definite cord compression. RIGHT greater than LEFT C7 nerve root impingement in the foramen is suspected.  C7-T1:  Disc space narrowing.  Facet arthropathy.  No impingement.  IMPRESSION: No acute intracranial findings. 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  Traumatic disruption of the C4-5 disc space, anterior inferior corner fracture of C4, and posterior element fractures of C4-C5 are redemonstrated.  Mildly increased T2 signal within the cord, maximal at C4-5, best visualized on STIR imaging, without associated blood products. Findings consistent with cord contusion/central cord syndrome. No visible epidural hematoma.  Mild canal stenosis is worst at C4-5, multifactorial in nature, with  canal diameter of approximately 7 mm.  Edema in the posterior soft tissues relates to muscular strain and probable injury of the interspinous ligaments. No traumatic subluxation is evident however.  Multilevel spondylosis elsewhere as described.   Electronically Signed   By: Davonna Belling M.D.   On:  08/02/2014 13:17    Scheduled Meds: . carvedilol  3.125 mg Oral BID WC  . latanoprost  1 drop Both Eyes QHS  . levothyroxine  100 mcg Oral QAC breakfast  . lisinopril  20 mg Oral q morning - 10a  . simvastatin  20 mg Oral QHS  . sodium chloride  3 mL Intravenous Q12H  . timolol  1 drop Both Eyes Daily   Continuous Infusions:    Time spent: 35 minutes  Kadar Chance L  [facility] Text page [email or web address], password [email or web address] 08/03/2014, 8:34 AM  LOS: 2 days

## 2014-08-03 NOTE — Evaluation (Signed)
Physical Therapy Evaluation Patient Details Name: Sherre LainRamon Desilva MRN: [id number] DOB: [birth year] Today's Date: 08/03/2014   History of Present Illness  Admitted post a fall into a ditch (where pt laid a few hours);   C4-5cervical fracture with cord contusion and probable ligamentous injury: Discussed with Dr. Venetia MaxonStern. Neck brace changed per his recommendations (vista).  Past Medical History  Diagnosis Date  . CAD in native artery -->  1994    referred for CABG  . S/P CABG x 4 1994    a) 1994: LIMA-LAD, SVG-RI, SVG-OM, SVG-RCA; b) 09/2006, Persantine Myoview: fixed inferior defect consistent with infarct/scar without peri-infarct ischemia;; 2-D echo: Mild-mod concentric LVH. Normal EF.  Abnormal relaxation, aortic sclerosis with mild-mod AI  . Chronic atrial fibrillation 12/15/2012  . Long term (current) use of anticoagulants 12/15/2012    warfarin  . Essential hypertension 05/23/2013  . Dyslipidemia, goal LDL below 70[272.4]      - on statin, followed by primary physician.   Marland Kitchen. Glaucoma    Past Surgical History  Procedure Laterality Date  . Coronary artery bypass graft  1994    LIMA-LAD, SVG-RI, SVG-OM, SVG-RCA   . Persantine myoview  February 2008    Fixed inferior wall defect-consistent with infarct/scar without peri-infarct ischemia  . Transthoracic echocardiogram  February 2008    Mild/mod conc LVH, Nl Size & Fxn, Gr 1 DD(abnormal relaxation), mild to moderate aortic regurgitation and mildly sclerotic aortic valve.     Clinical Impression   Pt admitted with above. Pt currently with functional limitations due to the deficits listed below (see PT Problem List).  Pt will benefit from skilled PT to increase their independence and safety with mobility to allow discharge to the venue listed below.       Follow Up Recommendations CIR;Supervision/Assistance - 24 hour    Equipment Recommendations  Rolling walker with 5" wheels;3in1 (PT)    Recommendations for Other Services        Precautions / Restrictions Precautions Precautions: Fall;Cervical Precaution Comments: Dysphagia 3 Required Braces or Orthoses: Cervical Brace Cervical Brace: At all times;Hard collar Restrictions Weight Bearing Restrictions: No      Mobility  Bed Mobility Overal bed mobility: Needs Assistance Bed Mobility: Rolling;Sidelying to Sit Rolling: Mod assist Sidelying to sit: Mod assist       General bed mobility comments: Cues for technique, and mod physical assist to elevate trunk from bed  Transfers Overall transfer level: Needs assistance Equipment used: Rolling walker (2 wheeled) Transfers: Sit to/from Stand Sit to Stand: +2 safety/equipment;Mod assist         General transfer comment: Cues for safety, technique and ahnd placement  Ambulation/Gait Ambulation/Gait assistance: +2 safety/equipment;Mod assist Ambulation Distance (Feet): 3 Feet (pivot steps bed to recliner) Assistive device: Rolling walker (2 wheeled) Gait Pattern/deviations: Decreased step length - right;Decreased step length - left;Trunk flexed     General Gait Details: Cues for stepping, and upright posture  Stairs            Wheelchair Mobility    Modified Rankin (Stroke Patients Only)       Balance Overall balance assessment: Needs assistance   Sitting balance-Leahy Scale: Fair     Standing balance support: During functional activity;Bilateral upper extremity supported Standing balance-Leahy Scale: Poor                               Pertinent Vitals/Pain Pain Assessment: Faces Pain Score: 8  Faces Pain Scale:  Hurts whole lot Pain Location: neck and back Pain Descriptors / Indicators: Grimacing (with the act of coming to sit) Pain Intervention(s): Monitored during session;Premedicated before session;Repositioned    Home Living Family/patient expects to be discharged to:: Private residence Living Arrangements: Spouse/significant other;Children Available Help at  Discharge: Family;Available 24 hours/day Type of Home: House Home Access: Stairs to enter Entrance Stairs-Rails: Left Entrance Stairs-Number of Steps: 3 Home Layout: One level Home Equipment: None      Prior Function Level of Independence: Independent         Comments: Walked 1-2 times a day; no assistive device     Hand Dominance        Extremity/Trunk Assessment   Upper Extremity Assessment: Generalized weakness;Defer to OT evaluation           Lower Extremity Assessment: Generalized weakness         Communication   Communication: Prefers language other than English (Spanish)  Cognition Arousal/Alertness: Awake/alert Behavior During Therapy: WFL for tasks assessed/performed Overall Cognitive Status: Within Functional Limits for tasks assessed                      General Comments General comments (skin integrity, edema, etc.): Lengthy discussion re; dc options with pt's family and daughter (granddaughter?)    Exercises        Assessment/Plan    PT Assessment Patient needs continued PT services  PT Diagnosis Difficulty walking;Generalized weakness;Acute pain   PT Problem List Decreased strength;Decreased range of motion;Decreased activity tolerance;Decreased balance;Decreased mobility;Decreased coordination;Decreased knowledge of use of DME;Decreased safety awareness;Decreased knowledge of precautions;Pain  PT Treatment Interventions DME instruction;Gait training;Stair training;Functional mobility training;Therapeutic activities;Therapeutic exercise;Patient/family education   PT Goals (Current goals can be found in the Care Plan section) Acute Rehab PT Goals Patient Stated Goal: not stated, but agreeable to OOB PT Goal Formulation: With patient/family Time For Goal Achievement: 08/17/14 Potential to Achieve Goals: Good    Frequency Min 4X/week   Barriers to discharge        Co-evaluation               End of Session Equipment  Utilized During Treatment: Cervical collar;Gait belt Activity Tolerance: Patient tolerated treatment well;Patient limited by pain Patient left: in chair;with call bell/phone within reach;with family/visitor present Nurse Communication: Mobility status         Time: [id number] (minus a few minutes) PT Time Calculation (min) (ACUTE ONLY): 43 min   Charges:   PT Evaluation $Initial PT Evaluation Tier I: 1 Procedure PT Treatments $Gait Training: 8-22 mins $Therapeutic Activity: 8-22 mins   PT G Codes:          Olen PelGarrigan, Nuriya Stuck Hamff 08/03/2014, 10:35 AM  Van ClinesHolly Jacarri Gesner, PT  Acute Rehabilitation Services Pager [phone] Office [phone]

## 2014-08-03 NOTE — Progress Notes (Signed)
Utilization Review Completed.Jacob [name] T12/01/2014  

## 2014-08-03 NOTE — Progress Notes (Signed)
Subjective: Patient reports swallowing and voice improving  Objective: Vital signs in last 24 hours: Temp:  [97.7 F (36.5 C)-99.1 F (37.3 C)] 98 F (36.7 C) (12/06 0943) Pulse Rate:  [69-91] 91 (12/06 0943) Resp:  [6-27] 21 (12/06 0943) BP: (109-172)/(49-78) 165/70 mmHg (12/06 0943) SpO2:  [92 %-98 %] 94 % (12/06 0943) Weight:  [72.2 kg (159 lb 2.8 oz)] 72.2 kg (159 lb 2.8 oz) (12/06 0442)  Intake/Output from previous day: 12/05 0701 - 12/06 0700 In: 123 [P.O.:120; I.V.:3] Out: 100 [Urine:100] Intake/Output this shift: Total I/O In: 3 [I.V.:3] Out: -   Physical Exam: Strength in right arm is a bit better.  Lab Results:  Recent Labs  08/02/14 0106  WBC 10.6*  HGB 11.1*  HCT 33.7*  PLT 221   BMET  Recent Labs  08/02/14 0106  NA 141  K 4.4  CL 104  CO2 22  GLUCOSE 131*  BUN 23  CREATININE 0.99  CALCIUM 9.2    Studies/Results: Mr Brain Wo Contrast  08/02/2014   CLINICAL DATA:  Larey Seat into a ditch while walking yesterday. Loss of consciousness. Neck pain. Initial encounter.  EXAM: MRI HEAD WITHOUT CONTRAST  MRI CERVICAL SPINE WITHOUT CONTRAST  TECHNIQUE: Multiplanar, multiecho pulse sequences of the brain and surrounding structures, and cervical spine, to include the craniocervical junction and cervicothoracic junction, were obtained without intravenous contrast.  COMPARISON:  CT head and cervical spine 08/01/2014.  FINDINGS: MRI HEAD FINDINGS  No evidence for acute infarction, hemorrhage, mass lesion, hydrocephalus, or extra-axial fluid. Generalized cerebral and cerebellar atrophy. Advanced T2 and FLAIR hyperintensity throughout the periventricular and subcortical white matter representing chronic microvascular ischemic change. No midline abnormality. Tiny microbleeds are seen throughout the parenchyma, likely sequelae of hypertensive cerebrovascular disease, particularly in the setting of dilated perivascular spaces and small vessel disease. No occult subdural or  epidural hematoma.  MRI CERVICAL SPINE FINDINGS  Alignment: No subluxation, although there is abnormal widening of the C4-5 disc space related to fracture.  Vertebrae: The C4 anterior inferior corner fracture is redemonstrated with slight bone marrow edema. No compression deformity. Posterior element fractures on the RIGHT at C4 and C5 are redemonstrated.  Cord: Abnormal cord signal at C3-4 and C4-5 without intramedullary hemorrhage, consistent with cord contusion/central cord syndrome.  Posterior Fossa: Unremarkable.  Vertebral Arteries: Equal in size and patent.  Paraspinal tissues: Dorsally, there is significant edema likely representing interspinous ligament strain or disruption. Paravertebral musculature are edematous on the RIGHT and LEFT most notably at the C4 and C5 levels likely representing strain.  Disc levels:  The individual disc spaces were examined as follows:  C2-3: Disc space narrowing with shallow central disc osteophyte complex. No impingement.  C3-4: Disc space narrowing. BILATERAL facet arthropathy and uncinate spurring. Central disc osteophyte complex. Mild ossification posterior longitudinal ligament. BILATERAL C4 nerve root impingement due to foraminal narrowing. Spinal canal adequate with sagittal diameter of 9 mm although slight effacement anterior subarachnoid space noted.  C4-5: Asymmetric LEFT-sided uncinate spurring and facet arthropathy. Shallow central disc protrusion. Posterior element indentation of the dorsal subarachnoid space. Mild canal stenosis with diameter 7 mm. Minimal cord flattening.  C5-6: Severe disc space narrowing. BILATERAL uncinate spurring. Shallow central disc osteophyte complex without cord compression. BILATERAL foraminal narrowing.  C6-7: BILATERAL uncinate spurring. Central ossification of the posterior longitudinal ligament. No definite cord compression. RIGHT greater than LEFT C7 nerve root impingement in the foramen is suspected.  C7-T1:  Disc space  narrowing.  Facet arthropathy.  No impingement.  IMPRESSION: No acute intracranial findings. 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  Traumatic disruption of the C4-5 disc space, anterior inferior corner fracture of C4, and posterior element fractures of C4-C5 are redemonstrated.  Mildly increased T2 signal within the cord, maximal at C4-5, best visualized on STIR imaging, without associated blood products. Findings consistent with cord contusion/central cord syndrome. No visible epidural hematoma.  Mild canal stenosis is worst at C4-5, multifactorial in nature, with canal diameter of approximately 7 mm.  Edema in the posterior soft tissues relates to muscular strain and probable injury of the interspinous ligaments. No traumatic subluxation is evident however.  Multilevel spondylosis elsewhere as described.   Electronically Signed   By: Davonna BellingJohn  Curnes M.D.   On: 08/02/2014 13:17   Mr Cervical Spine Wo Contrast  08/02/2014   CLINICAL DATA:  Larey SeatFell into a ditch while walking yesterday. Loss of consciousness. Neck pain. Initial encounter.  EXAM: MRI HEAD WITHOUT CONTRAST  MRI CERVICAL SPINE WITHOUT CONTRAST  TECHNIQUE: Multiplanar, multiecho pulse sequences of the brain and surrounding structures, and cervical spine, to include the craniocervical junction and cervicothoracic junction, were obtained without intravenous contrast.  COMPARISON:  CT head and cervical spine 08/01/2014.  FINDINGS: MRI HEAD FINDINGS  No evidence for acute infarction, hemorrhage, mass lesion, hydrocephalus, or extra-axial fluid. Generalized cerebral and cerebellar atrophy. Advanced T2 and FLAIR hyperintensity throughout the periventricular and subcortical white matter representing chronic microvascular ischemic change. No midline abnormality. Tiny microbleeds are seen throughout the parenchyma, likely sequelae of hypertensive cerebrovascular disease, particularly in the setting of dilated perivascular spaces and  small vessel disease. No occult subdural or epidural hematoma.  MRI CERVICAL SPINE FINDINGS  Alignment: No subluxation, although there is abnormal widening of the C4-5 disc space related to fracture.  Vertebrae: The C4 anterior inferior corner fracture is redemonstrated with slight bone marrow edema. No compression deformity. Posterior element fractures on the RIGHT at C4 and C5 are redemonstrated.  Cord: Abnormal cord signal at C3-4 and C4-5 without intramedullary hemorrhage, consistent with cord contusion/central cord syndrome.  Posterior Fossa: Unremarkable.  Vertebral Arteries: Equal in size and patent.  Paraspinal tissues: Dorsally, there is significant edema likely representing interspinous ligament strain or disruption. Paravertebral musculature are edematous on the RIGHT and LEFT most notably at the C4 and C5 levels likely representing strain.  Disc levels:  The individual disc spaces were examined as follows:  C2-3: Disc space narrowing with shallow central disc osteophyte complex. No impingement.  C3-4: Disc space narrowing. BILATERAL facet arthropathy and uncinate spurring. Central disc osteophyte complex. Mild ossification posterior longitudinal ligament. BILATERAL C4 nerve root impingement due to foraminal narrowing. Spinal canal adequate with sagittal diameter of 9 mm although slight effacement anterior subarachnoid space noted.  C4-5: Asymmetric LEFT-sided uncinate spurring and facet arthropathy. Shallow central disc protrusion. Posterior element indentation of the dorsal subarachnoid space. Mild canal stenosis with diameter 7 mm. Minimal cord flattening.  C5-6: Severe disc space narrowing. BILATERAL uncinate spurring. Shallow central disc osteophyte complex without cord compression. BILATERAL foraminal narrowing.  C6-7: BILATERAL uncinate spurring. Central ossification of the posterior longitudinal ligament. No definite cord compression. RIGHT greater than LEFT C7 nerve root impingement in the  foramen is suspected.  C7-T1:  Disc space narrowing.  Facet arthropathy.  No impingement.  IMPRESSION: No acute intracranial findings. 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  Traumatic disruption of the C4-5 disc space, anterior inferior corner fracture of C4, and posterior element fractures of C4-C5 are redemonstrated.  Mildly increased T2 signal within the cord, maximal at C4-5, best visualized on STIR imaging, without associated blood products. Findings consistent with cord contusion/central cord syndrome. No visible epidural hematoma.  Mild canal stenosis is worst at C4-5, multifactorial in nature, with canal diameter of approximately 7 mm.  Edema in the posterior soft tissues relates to muscular strain and probable injury of the interspinous ligaments. No traumatic subluxation is evident however.  Multilevel spondylosis elsewhere as described.   Electronically Signed   By: Davonna BellingJohn  Curnes M.D.   On: 08/02/2014 13:17    Assessment/Plan: Unstable C-spine fracture currently being managed with C-collar.  Family has decided against surgery and plan Rehab.  He should f/u with me in office in one month with cervical spine radiographs to assess healing.    LOS: 2 days    Dorian HeckleSTERN,Paiton Boultinghouse D, MD 08/03/2014, 1:16 PM

## 2014-08-04 ENCOUNTER — Other Ambulatory Visit: Payer: Self-pay | Admitting: Pharmacist Clinician (PhC)/ Clinical Pharmacy Specialist

## 2014-08-04 ENCOUNTER — Inpatient Hospital Stay (HOSPITAL_COMMUNITY): Payer: Medicare HMO

## 2014-08-04 DIAGNOSIS — S12300S Unspecified displaced fracture of fourth cervical vertebra, sequela: Secondary | ICD-10-CM

## 2014-08-04 DIAGNOSIS — S14109S Unspecified injury at unspecified level of cervical spinal cord, sequela: Secondary | ICD-10-CM

## 2014-08-04 MED ORDER — SENNOSIDES-DOCUSATE SODIUM 8.6-50 MG PO TABS
2.0000 | ORAL_TABLET | Freq: Every day | ORAL | Status: DC
  Administered 2014-08-04 – 2014-08-06 (×2): 2 via ORAL
  Filled 2014-08-04: qty 2

## 2014-08-04 MED ORDER — SODIUM CHLORIDE 0.9 % IV SOLN
INTRAVENOUS | Status: DC
  Administered 2014-08-05: 17:00:00 via INTRAVENOUS

## 2014-08-04 MED ORDER — WARFARIN SODIUM 5 MG PO TABS: ORAL_TABLET | ORAL | Status: DC

## 2014-08-04 MED ORDER — CHLORHEXIDINE GLUCONATE 0.12 % MT SOLN
15.0000 mL | Freq: Two times a day (BID) | OROMUCOSAL | Status: DC
  Administered 2014-08-04 – 2014-08-06 (×4): 15 mL via OROMUCOSAL
  Filled 2014-08-04 (×7): qty 15

## 2014-08-04 MED ORDER — HYDROCODONE-ACETAMINOPHEN 5-325 MG PO TABS
1.0000 | ORAL_TABLET | Freq: Four times a day (QID) | ORAL | Status: DC | PRN
  Administered 2014-08-06: 2 via ORAL
  Filled 2014-08-04: qty 2

## 2014-08-04 MED ORDER — CETYLPYRIDINIUM CHLORIDE 0.05 % MT LIQD
7.0000 mL | Freq: Two times a day (BID) | OROMUCOSAL | Status: DC
  Administered 2014-08-04 – 2014-08-06 (×6): 7 mL via OROMUCOSAL

## 2014-08-04 MED ORDER — ACETAMINOPHEN 160 MG/5ML PO SOLN
650.0000 mg | Freq: Four times a day (QID) | ORAL | Status: DC | PRN
  Filled 2014-08-04: qty 20.3
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
## 2014-08-04 NOTE — Progress Notes (Signed)
Cervical collar

## 2014-08-04 NOTE — Progress Notes (Signed)
Rehab Admissions Coordinator Note:  Patient was screened by Clois DupesBoyette, Domnick Chervenak Godwin for appropriateness for an Inpatient Acute Rehab Consult per PT recommendation.   At this time, we are recommending Inpatient Rehab consult. I iwll contact attending MD for order.  Clois DupesBoyette, Maximum Reiland Godwin 08/04/2014, 8:29 AM  I can be reached at [phone].
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
## 2014-08-04 NOTE — Progress Notes (Signed)
[facility] PROGRESS NOTE  Sherre LainRamon Matherne OZH:[id number] DOB: Jul 11, 1917 DOA: 08/01/2014 PCP: Aida PufferLITTLE,JAMES, MD  Assessment/Plan:  Principal Problem:   C4-5cervical fracture with cord contusion and probable ligamentous injury: patient and family have decided against surgery. Have consulted PM&R for inpatient rehab consideration. Daughter requesting morphine be stopped due to confusion. Active Problems:   Chronic atrial fibrillation, rate controlled. No Coumadin or antiplatelet agents in the setting of acute C-spine fracture.   CAD in native artery -->  CABG x4 in 1994   Dyslipidemia, goal LDL below 70    Essential hypertension: stable   Elevated troponin:  EKG without acute ischemic changes and troponin trend is flat. No chest pain. Doubt acute coronary syndrome. Will defer further workup given patient's lack of symptoms and advanced age.   Hypothyroidism:  TSH low. Synthroid increased.   Dysphagia secondary to C-spine injury and swelling: Speech therapy has changed recommendation to D1 with pudding thickened liquids. Family requesting IVF in case unable to take enough liquid. Constipation: bowel regimen  Code Status:  DO NOT RESUSCITATE  Family Communication:  Wife at bedside Disposition Plan:  CIR v. SNF  Consultants:   neurosurgery   PM&R  Procedures:     Antibiotics:    HPI/Subjective: Per family, no stool since admission. They report he gets confused on morphine.  Objective: Filed Vitals:   08/04/14 0726  BP: 139/61  Pulse: 67  Temp: 98.1 F (36.7 C)  Resp: 16    Intake/Output Summary (Last 24 hours) at 08/04/14 1408 Last data filed at 08/03/14 2130  Gross per 24 hour  Intake      0 ml  Output    100 ml  Net   -100 ml   Filed Weights   08/01/14 2253 08/03/14 0442  Weight: 72.4 kg (159 lb 9.8 oz) 72.2 kg (159 lb 2.8 oz)    Exam:   General:   Working with PT. Walking in room  HEENT: neck brace  Cardiovascular:  irregularly irregular without  murmurs gallops rubs   Respiratory:  clear to auscultation bilaterally without wheezes rhonchi or rales   Abdomen:  soft nontender nondistended. Bowel sounds present.   Ext:  left fourth finger post amputation. No pedal edema.    neurologic: Cranial nerves intact. Motor strength 5 out of 5 throughout.  Basic Metabolic Panel:  Recent Labs Lab 08/02/14 0106 08/02/14 0751  NA 141  --   K 4.4  --   CL 104  --   CO2 22  --   GLUCOSE 131*  --   BUN 23  --   CREATININE 0.99  --   CALCIUM 9.2  --   MG  --  1.9  PHOS  --  2.3   Liver Function Tests:  Recent Labs Lab 08/02/14 0106  AST 39*  ALT 20  ALKPHOS 114  BILITOT 0.8  PROT 7.2  ALBUMIN 3.3*   No results for input(s): LIPASE, AMYLASE in the last 168 hours. No results for input(s): AMMONIA in the last 168 hours. CBC:  Recent Labs Lab 08/02/14 0106  WBC 10.6*  NEUTROABS 8.6*  HGB 11.1*  HCT 33.7*  MCV 92.6  PLT 221   Cardiac Enzymes:  Recent Labs Lab 08/02/14 0106 08/02/14 0751 08/02/14 1316 08/03/14 0409  CKTOTAL  --   --   --  449*  TROPONINI 0.63* 0.65* 0.57*  --    BNP (last 3 results) No results for input(s): PROBNP in the last 8760 hours. CBG: No results for  input(s): GLUCAP in the last 168 hours.  Recent Results (from the past 240 hour(s))  MRSA PCR Screening     Status: None   Collection Time: 08/01/14 11:26 PM  Result Value Ref Range Status   MRSA by PCR NEGATIVE NEGATIVE Final    Comment:        The GeneXpert MRSA Assay (FDA approved for NASAL specimens only), is one component of a comprehensive MRSA colonization surveillance program. It is not intended to diagnose MRSA infection nor to guide or monitor treatment for MRSA infections.   Culture, Urine     Status: None   Collection Time: 08/02/14  4:25 AM  Result Value Ref Range Status   Specimen Description URINE, CLEAN CATCH  Final   Special Requests NONE  Final   Culture  Setup Time   Final    08/02/2014 12:04 Performed  at [facility] Count   Final    7,000 COLONIES/ML Performed at [facility]    Culture   Final    INSIGNIFICANT GROWTH Performed at [facility]    Report Status 08/03/2014 FINAL  Final     Studies: Dg Swallowing Func-speech Pathology  08/04/2014   Riley Nearing Deblois, CCC-SLP     08/04/2014 12:34 PM Objective Swallowing Evaluation: Modified Barium Swallowing Study   Patient Details  Name: Jacob [name] MRN: [id number] Date of Birth: [birth year]  Today's Date: 08/04/2014 Time: 4098-1191 SLP Time Calculation (min) (ACUTE ONLY): 42 min  Past Medical History:  Past Medical History  Diagnosis Date  . CAD in native artery -->  1994    referred for CABG  . S/P CABG x 4 1994    a) 1994: LIMA-LAD, SVG-RI, SVG-OM, SVG-RCA; b) 09/2006,  Persantine Myoview: fixed inferior defect consistent with  infarct/scar without peri-infarct ischemia;; 2-D echo: Mild-mod  concentric LVH. Normal EF.  Abnormal relaxation, aortic sclerosis  with mild-mod AI  . Chronic atrial fibrillation 12/15/2012  . Long term (current) use of anticoagulants 12/15/2012    warfarin  . Essential hypertension 05/23/2013  . Dyslipidemia, goal LDL below 70[272.4]      - on statin, followed by primary physician.   Marland Kitchen Glaucoma    Past Surgical History:  Past Surgical History  Procedure Laterality Date  . Coronary artery bypass graft  1994    LIMA-LAD, SVG-RI, SVG-OM, SVG-RCA   . Persantine myoview  February 2008    Fixed inferior wall defect-consistent with infarct/scar without  peri-infarct ischemia  . Transthoracic echocardiogram  February 2008    Mild/mod conc LVH, Nl Size & Fxn, Gr 1 DD(abnormal relaxation),  mild to moderate aortic regurgitation and mildly sclerotic aortic  valve.   HPI:  Jacob [name] is a 80 y.o. male with a pmhx significant for CAD,  s/p CABG x4 (1994), Chronic atrial fibrillation (11/2012), long  term use of anticoagulants, essential hypertension, dyslipidemia,  glaucoma who was brought to ER on  12/4 and found to have C4-C5  fracture with possible perivertibral hematoma; BSE ordered to r/o  aspiration d/t injury.      Assessment / Plan / Recommendation Clinical Impression  Dysphagia Diagnosis: Severe pharyngeal phase dysphagia;Severe  cervical esophageal phase dysphagia Clinical impression: Pt demonstrates a moderate to severe  oropharyngeal and cervical esophageal dysphagia due to appearance  of swelling at posterior pharyngeal wall and at cervical  esophagus. Pt seen to silently aspirate nectar and honey thick  liquids after the swallow as residue and backflow from tight  cervical  esophagus fall to airway through interarytenoid space.  Changing bolus size reduced, but did not eliminate, aspiration.  Pt unable to utilize postures as cervical collar impedes  movement. Pt is able to tolerate purees and puddings without  aspiration though multiple swallows are helpful to fully transit  bolus. Recommend a few days of Puree/pudding thick diet (no  liquids) until swelling improves to reduce risk of aspiration in  the short term. Pt may have ice chips after oral care. SLP will  follow for improvement.     Treatment Recommendation  Therapy as outlined in treatment plan below    Diet Recommendation Dysphagia 1 (Puree);Pudding-thick liquid   Liquid Administration via: Spoon Medication Administration: Crushed with puree (liquid meds ok -  thicken to pudding) Supervision: Staff to assist with self feeding Compensations: Slow rate;Small sips/bites;Multiple dry swallows  after each bite/sip;Clear throat intermittently Postural Changes and/or Swallow Maneuvers: Seated upright 90  degrees    Other  Recommendations Oral Care Recommendations: Oral care BID Other Recommendations: Order thickener from pharmacy;Prohibited  food (jello, ice cream, thin soups)   Follow Up Recommendations  Inpatient Rehab    Frequency and Duration min 3x week  2 weeks   Pertinent Vitals/Pain NA    SLP Swallow Goals     General HPI: Sherre LainRamon Truss  is a 80 y.o. male with a pmhx  significant for CAD, s/p CABG x4 (1994), Chronic atrial  fibrillation (11/2012), long term use of anticoagulants, essential  hypertension, dyslipidemia, glaucoma who was brought to ER on  12/4 and found to have C4-C5 fracture with possible perivertibral  hematoma; BSE ordered to r/o aspiration d/t injury.  Type of Study: Modified Barium Swallowing Study Reason for Referral: Objectively evaluate swallowing function Diet Prior to this Study: Dysphagia 1 (puree);Nectar-thick  liquids Temperature Spikes Noted: No Respiratory Status: Room air History of Recent Intubation: No Behavior/Cognition: Alert;Cooperative;Pleasant mood Oral Cavity - Dentition: Dentures, top;Missing  dentition;Dentures, bottom Oral Motor / Sensory Function: Within functional limits Self-Feeding Abilities: Able to feed self;Needs assist Patient Positioning: Upright in chair Baseline Vocal Quality: Clear;Low vocal intensity Volitional Cough: Strong Volitional Swallow: Able to elicit Anatomy: Other (Comment) (apparent edema at posterior pharyngeal  wall) Pharyngeal Secretions: Not observed secondary MBS    Reason for Referral Objectively evaluate swallowing function   Oral Phase Oral Preparation/Oral Phase Oral Phase: WFL   Pharyngeal Phase Pharyngeal Phase Pharyngeal Phase: Impaired Pharyngeal - Honey Pharyngeal - Honey Cup: Penetration/Aspiration after  swallow;Reduced anterior laryngeal mobility;Moderate  aspiration;Pharyngeal residue - cp segment;Pharyngeal residue -  pyriform sinuses;Inter-arytenoid space residue Penetration/Aspiration details (honey cup): Material enters  airway, passes BELOW cords without attempt by patient to eject  out (silent aspiration) Pharyngeal - Nectar Pharyngeal - Nectar Teaspoon: Penetration/Aspiration after  swallow;Reduced anterior laryngeal mobility;Moderate  aspiration;Pharyngeal residue - cp segment;Pharyngeal residue -  pyriform sinuses;Inter-arytenoid space residue  Penetration/Aspiration details (nectar teaspoon): Material enters  airway, passes BELOW cords without attempt by patient to eject  out (silent aspiration) Pharyngeal - Nectar Cup: Penetration/Aspiration after  swallow;Reduced anterior laryngeal mobility;Moderate  aspiration;Pharyngeal residue - cp segment;Pharyngeal residue -  pyriform sinuses;Inter-arytenoid space residue Penetration/Aspiration details (nectar cup): Material enters  airway, passes BELOW cords without attempt by patient to eject  out (silent aspiration) Pharyngeal - Solids Pharyngeal - Puree: Reduced anterior laryngeal  mobility;Pharyngeal residue - cp segment;Pharyngeal residue -  pyriform sinuses;Inter-arytenoid space residue Penetration/Aspiration details (puree): Material does not enter  airway  Cervical Esophageal Phase    GO  Harlon Ditty, MA CCC-SLP [phone]  DeBlois, Riley Nearing 08/04/2014, 12:32 PM     Scheduled Meds: . antiseptic oral rinse  7 mL Mouth Rinse q12n4p  . carvedilol  3.125 mg Oral BID WC  . chlorhexidine  15 mL Mouth Rinse BID  . latanoprost  1 drop Both Eyes QHS  . levothyroxine  100 mcg Oral QAC breakfast  . lisinopril  20 mg Oral q morning - 10a  . simvastatin  20 mg Oral QHS  . sodium chloride  3 mL Intravenous Q12H  . timolol  1 drop Both Eyes Daily   Continuous Infusions: . sodium chloride      Time spent: 35 minutes  Tameah Mihalko L  [facility] Text page [email or web address], password [facility] 08/04/2014, 2:08 PM  LOS: 3 days

## 2014-08-04 NOTE — Progress Notes (Signed)
Physical Therapy Treatment Patient Details Name: Jacob LainRamon Muff MRN: [id number] DOB: August 26, 1917 Today's Date: 08/04/2014    History of Present Illness 80 y.o. Spainish speaking gentleman [facility] s/p fall into a ditch (where pt laid a few hours); C4-5cervical fracture with cord contusion and probable ligamentous injury: Discussed with Dr. Venetia MaxonStern. Neck brace changed per his recommendations (vista).  Pt with significant PMHx of CAD, CABG, A-fib, anticoagulation, HTN, and glaucoma.     PT Comments    Asked to scoot pt back in the chair (as he was sliding out) when daughter informed me that he was supposed to walk again.  I had time, so I helped facilitate safe mobility.  Pt refused to sit up for his agreed to second hour in the chair, but I was able to negotiate that he get up after resting to eat his dinner in the chair instead.  Gait is better, but safe RW use may be limited by poor vision (has glaucoma) and more than support at his trunk, I had to provide support to steer the RW straight down the hallway.  He continues to be appropriate for CIR level therapy at this time.  PT will continue to follow acutely.   Follow Up Recommendations  CIR;Supervision/Assistance - 24 hour     Equipment Recommendations  Rolling walker with 5" wheels;3in1 (PT)    Recommendations for Other Services   NA     Precautions / Restrictions Precautions Precautions: Fall;Cervical Precaution Comments: Dysphagia 3, nectar thick liquids Required Braces or Orthoses: Cervical Brace Cervical Brace: Hard collar;At all times Restrictions Weight Bearing Restrictions: No    Mobility  Bed Mobility Overal bed mobility: Needs Assistance Bed Mobility: Sit to Sidelying Rolling: Mod assist Sidelying to sit: Mod assist     Sit to sidelying: Mod assist General bed mobility comments: Mod physical assist of bil legs.  Verbal cues and manual assist to preform reverse log roll technique correctly.    Transfers Overall transfer level: Needs assistance Equipment used: Rolling walker (2 wheeled) Transfers: Sit to/from Stand Sit to Stand: Min assist         General transfer comment: Min assist to support trunk during transitions from both the recliner chair and the toilet.  Verbal cues to try to limit how much he used his hands, encouraged using his legs more.    Ambulation/Gait Ambulation/Gait assistance: Min assist Ambulation Distance (Feet): 120 Feet Assistive device: Rolling walker (2 wheeled) Gait Pattern/deviations: Step-through pattern;Staggering left;Staggering right Gait velocity: too fast to be safe   General Gait Details: Verbal cues to slow down his gait speed for safety.  Manual assist to support trunk for balance and to help steer RW (he has very poor vision).  Also, verbal cues for upright posture during gait.           Balance Overall balance assessment: Needs assistance Sitting-balance support: Feet supported;Bilateral upper extremity supported Sitting balance-Leahy Scale: Fair     Standing balance support: Bilateral upper extremity supported Standing balance-Leahy Scale: Fair                      Cognition Arousal/Alertness: Awake/alert Behavior During Therapy: WFL for tasks assessed/performed Overall Cognitive Status: Within Functional Limits for tasks assessed                         General Comments General comments (skin integrity, edema, etc.): Daughter present and helping to translate for her father.  Pt refused to stay up his second hour in the chair, but we made a deal that if he goes to bed now he must get up in an hour and a half for dinner and eat dinner in the chair.       Pertinent Vitals/Pain Pain Assessment: Faces Faces Pain Scale: Hurts whole lot Pain Location: neck and back Pain Descriptors / Indicators: Burning;Aching Pain Intervention(s): Limited activity within patient's tolerance;Monitored during  session;Repositioned           PT Goals (current goals can now be found in the care plan section) Acute Rehab PT Goals Patient Stated Goal: per daughter, to go home, decrease pain Progress towards PT goals: Progressing toward goals    Frequency  Min 4X/week    PT Plan Current plan remains appropriate       End of Session Equipment Utilized During Treatment: Cervical collar Activity Tolerance: Patient limited by pain Patient left: in bed;with call bell/phone within reach;with family/visitor present     Time: 1542-1600 PT Time Calculation (min) (ACUTE ONLY): 18 min  Charges:  $Gait Training: 8-22 mins                      Hetvi Shawhan B. Philo Kurtz, PT, DPT [phone]   08/04/2014, 4:08 PM

## 2014-08-04 NOTE — Procedures (Signed)
Objective Swallowing Evaluation: Modified Barium Swallowing Study  Patient Details  Name: Jacob [name] MRN: [id number] Date of Birth: [birth year]  Today's Date: 08/04/2014 Time: [id number] SLP Time Calculation (min) (ACUTE ONLY): 42 min  Past Medical History:  Past Medical History  Diagnosis Date  . CAD in native artery -->  1994    referred for CABG  . S/P CABG x 4 1994    a) 1994: LIMA-LAD, SVG-RI, SVG-OM, SVG-RCA; b) 09/2006, Persantine Myoview: fixed inferior defect consistent with infarct/scar without peri-infarct ischemia;; 2-D echo: Mild-mod concentric LVH. Normal EF.  Abnormal relaxation, aortic sclerosis with mild-mod AI  . Chronic atrial fibrillation 12/15/2012  . Long term (current) use of anticoagulants 12/15/2012    warfarin  . Essential hypertension 05/23/2013  . Dyslipidemia, goal LDL below 70[272.4]      - on statin, followed by primary physician.   Marland Kitchen. Glaucoma    Past Surgical History:  Past Surgical History  Procedure Laterality Date  . Coronary artery bypass graft  1994    LIMA-LAD, SVG-RI, SVG-OM, SVG-RCA   . Persantine myoview  February 2008    Fixed inferior wall defect-consistent with infarct/scar without peri-infarct ischemia  . Transthoracic echocardiogram  February 2008    Mild/mod conc LVH, Nl Size & Fxn, Gr 1 DD(abnormal relaxation), mild to moderate aortic regurgitation and mildly sclerotic aortic valve.   HPI:  Jacob LainRamon [name] is a 80 y.o. male with a pmhx significant for CAD, s/p CABG x4 (1994), Chronic atrial fibrillation (11/2012), long term use of anticoagulants, essential hypertension, dyslipidemia, glaucoma who was brought to ER on 12/4 and found to have C4-C5 fracture with possible perivertibral hematoma; BSE ordered to r/o aspiration d/t injury.      Assessment / Plan / Recommendation Clinical Impression  Dysphagia Diagnosis: Severe pharyngeal phase dysphagia;Severe cervical esophageal phase dysphagia Clinical impression: Pt demonstrates a moderate  to severe oropharyngeal and cervical esophageal dysphagia due to appearance of swelling at posterior pharyngeal wall and at cervical esophagus. Pt seen to silently aspirate nectar and honey thick liquids after the swallow as residue and backflow from tight cervical esophagus fall to airway through interarytenoid space. Changing bolus size reduced, but did not eliminate, aspiration. Pt unable to utilize postures as cervical collar impedes movement. Pt is able to tolerate purees and puddings without aspiration though multiple swallows are helpful to fully transit bolus. Recommend a few days of Puree/pudding thick diet (no liquids) until swelling improves to reduce risk of aspiration in the short term. Pt may have ice chips after oral care. SLP will follow for improvement.     Treatment Recommendation  Therapy as outlined in treatment plan below    Diet Recommendation Dysphagia 1 (Puree);Pudding-thick liquid   Liquid Administration via: Spoon Medication Administration: Crushed with puree (liquid meds ok - thicken to pudding) Supervision: Staff to assist with self feeding Compensations: Slow rate;Small sips/bites;Multiple dry swallows after each bite/sip;Clear throat intermittently Postural Changes and/or Swallow Maneuvers: Seated upright 90 degrees    Other  Recommendations Oral Care Recommendations: Oral care BID Other Recommendations: Order thickener from pharmacy;Prohibited food (jello, ice cream, thin soups)   Follow Up Recommendations  Inpatient Rehab    Frequency and Duration min 3x week  2 weeks   Pertinent Vitals/Pain NA    SLP Swallow Goals     General HPI: Jacob [name] is a 80 y.o. male with a pmhx significant for CAD, s/p CABG x4 (1994), Chronic atrial fibrillation (11/2012), long term use of anticoagulants, essential hypertension, dyslipidemia, glaucoma who was  brought to ER on 12/4 and found to have C4-C5 fracture with possible perivertibral hematoma; BSE ordered to r/o  aspiration d/t injury.  Type of Study: Modified Barium Swallowing Study Reason for Referral: Objectively evaluate swallowing function Diet Prior to this Study: Dysphagia 1 (puree);Nectar-thick liquids Temperature Spikes Noted: No Respiratory Status: Room air History of Recent Intubation: No Behavior/Cognition: Alert;Cooperative;Pleasant mood Oral Cavity - Dentition: Dentures, top;Missing dentition;Dentures, bottom Oral Motor / Sensory Function: Within functional limits Self-Feeding Abilities: Able to feed self;Needs assist Patient Positioning: Upright in chair Baseline Vocal Quality: Clear;Low vocal intensity Volitional Cough: Strong Volitional Swallow: Able to elicit Anatomy: Other (Comment) (apparent edema at posterior pharyngeal wall) Pharyngeal Secretions: Not observed secondary MBS    Reason for Referral Objectively evaluate swallowing function   Oral Phase Oral Preparation/Oral Phase Oral Phase: WFL   Pharyngeal Phase Pharyngeal Phase Pharyngeal Phase: Impaired Pharyngeal - Honey Pharyngeal - Honey Cup: Penetration/Aspiration after swallow;Reduced anterior laryngeal mobility;Moderate aspiration;Pharyngeal residue - cp segment;Pharyngeal residue - pyriform sinuses;Inter-arytenoid space residue Penetration/Aspiration details (honey cup): Material enters airway, passes BELOW cords without attempt by patient to eject out (silent aspiration) Pharyngeal - Nectar Pharyngeal - Nectar Teaspoon: Penetration/Aspiration after swallow;Reduced anterior laryngeal mobility;Moderate aspiration;Pharyngeal residue - cp segment;Pharyngeal residue - pyriform sinuses;Inter-arytenoid space residue Penetration/Aspiration details (nectar teaspoon): Material enters airway, passes BELOW cords without attempt by patient to eject out (silent aspiration) Pharyngeal - Nectar Cup: Penetration/Aspiration after swallow;Reduced anterior laryngeal mobility;Moderate aspiration;Pharyngeal residue - cp  segment;Pharyngeal residue - pyriform sinuses;Inter-arytenoid space residue Penetration/Aspiration details (nectar cup): Material enters airway, passes BELOW cords without attempt by patient to eject out (silent aspiration) Pharyngeal - Solids Pharyngeal - Puree: Reduced anterior laryngeal mobility;Pharyngeal residue - cp segment;Pharyngeal residue - pyriform sinuses;Inter-arytenoid space residue Penetration/Aspiration details (puree): Material does not enter airway  Cervical Esophageal Phase    GO             Harlon DittyBonnie Trell Secrist, MA CCC-SLP [phone]  [name], Kahleel Fadeley Caroline 08/04/2014, 12:32 PM

## 2014-08-04 NOTE — Progress Notes (Signed)
Patient had barium swallow this morning. Patient is aspirating all liquids including nectar thick. Patient will now be on a pureed diet, everything must be thickened.

## 2014-08-04 NOTE — Progress Notes (Signed)
Patient does not speak [place] but understands some

## 2014-08-04 NOTE — Progress Notes (Signed)
Speech Language Pathology Treatment: Dysphagia  Patient Details Name: Jacob [name] MRN: [id number] DOB: 23-Sep-1916 Today's Date: 08/04/2014 Time: [id number] SLP Time Calculation (min) (ACUTE ONLY): 8 min  Assessment / Plan / Recommendation Clinical Impression  SLP assisted family and pt at bedside with further recommendations for compensatory strategies, pt able to follow multiple swallow/throat clear with min verbal cues from SLP and wife. Also provided demonstration and teaching regarding thickening and use of ice chips and oral care. Pt and family eager for education and participate well with therapy. Pt likely to progress in the short term with decreased edema and use of compensatory strategies.    HPI HPI: Jacob [name] is a 80 y.o. male with a pmhx significant for CAD, s/p CABG x4 (1994), Chronic atrial fibrillation (11/2012), long term use of anticoagulants, essential hypertension, dyslipidemia, glaucoma who was brought to ER on 12/4 and found to have C4-C5 fracture with possible perivertibral hematoma; BSE ordered to r/o aspiration d/t injury.    Pertinent Vitals Pain Assessment: No/denies pain  SLP Plan  Continue with current plan of care    Recommendations Diet recommendations: Dysphagia 1 (puree);Pudding-thick liquid Liquids provided via: Teaspoon Medication Administration: Crushed with puree Supervision: Staff to assist with self feeding Compensations: Slow rate;Small sips/bites;Multiple dry swallows after each bite/sip;Clear throat intermittently Postural Changes and/or Swallow Maneuvers: Seated upright 90 degrees              Oral Care Recommendations: Oral care Q4 per protocol Follow up Recommendations: Inpatient Rehab Plan: Continue with current plan of care    GO    [facility] SystemBonnie [name], MA CCC-SLP [phone]  Claudine [name], Jacob Fann Caroline 08/04/2014, 2:27 PM

## 2014-08-05 NOTE — Progress Notes (Signed)
Interpreter Wyvonnia DuskyGraciela Namihira for PT [place] and OT [place]

## 2014-08-05 NOTE — Progress Notes (Signed)
Physical Therapy Treatment Patient Details Name: Jacob [name] MRN: [id number] DOB: 16-Nov-1916 Today's Date: 08/05/2014    History of Present Illness 80 y.o. Spainish speaking gentleman [facility] s/p fall into a ditch (where pt laid a few hours); C4-5cervical fracture with cord contusion and probable ligamentous injury: Discussed with Dr. Venetia MaxonStern. Neck brace changed per his recommendations (vista).  Pt with significant PMHx of CAD, CABG, A-fib, anticoagulation, HTN, and glaucoma.     PT Comments    Continuing progress with functional mobility; Session focused on posture and control of RW during amb, with tactile cues at shoulders and between scapulae with some success; Will need reinforcement, and assessment of carryover next few sessions  Continue to recommend comprehensive inpatient rehab (CIR) for post-acute therapy needs to maximize independence and safety with mobility prior to dc home with supportive family   Follow Up Recommendations  CIR;Supervision/Assistance - 24 hour     Equipment Recommendations  Rolling walker with 5" wheels;3in1 (PT) (pt may already have)    Recommendations for Other Services       Precautions / Restrictions Precautions Precautions: Fall;Cervical Precaution Comments: Dysphagia 3, Pudding thick liquids Required Braces or Orthoses: Cervical Brace Cervical Brace: Hard collar;At all times Restrictions Weight Bearing Restrictions: No    Mobility  Bed Mobility Overal bed mobility: Needs Assistance Bed Mobility: Rolling;Sidelying to Sit Rolling: Mod assist Sidelying to sit: Mod assist       General bed mobility comments: Cues for technique; physical assist given at pelvic and shoulder girdles for sidelie to sit  Transfers Overall transfer level: Needs assistance Equipment used: Rolling walker (2 wheeled) Transfers: Sit to/from Stand Sit to Stand: Min assist         General transfer comment: Min assist to support trunk during  transitions from both the recliner chair and the toilet.  Verbal cues to try to limit how much he used his hands, encouraged using his legs more.    Ambulation/Gait Ambulation/Gait assistance: Min assist;Mod assist Ambulation Distance (Feet): 120 Feet (with seated rest break) Assistive device: Rolling walker (2 wheeled) Gait Pattern/deviations: Step-through pattern;Trunk flexed Gait velocity: Verbal and tactile cues to stay slower and more controlled   General Gait Details: Cues for upright posture predominantly; noted pt tended to speed up as we got closer to destination; tactile cueing at upper trunk, shoulders for more upright posture, and mod assist at times to keep RW close   Stairs            Wheelchair Mobility    Modified Rankin (Stroke Patients Only)       Balance     Sitting balance-Leahy Scale: Fair       Standing balance-Leahy Scale: Fair                      Cognition Arousal/Alertness: Awake/alert Behavior During Therapy: WFL for tasks assessed/performed Overall Cognitive Status: Within Functional Limits for tasks assessed                      Exercises      General Comments General comments (skin integrity, edema, etc.): Graciela present for Spanish interpretation      Pertinent Vitals/Pain Pain Assessment: 0-10 Pain Score: 5  Pain Location: Neck with walking Pain Descriptors / Indicators: Aching Pain Intervention(s): Monitored during session;Repositioned;Other (comment) (Educated that better posture should help with pain)    Home Living  Prior Function            PT Goals (current goals can now be found in the care plan section) Acute Rehab PT Goals Patient Stated Goal: per daughter, to go home, decrease pain PT Goal Formulation: With patient/family Time For Goal Achievement: 08/17/14 Potential to Achieve Goals: Good Progress towards PT goals: Progressing toward goals    Frequency  Min  4X/week    PT Plan Current plan remains appropriate    Co-evaluation             End of Session Equipment Utilized During Treatment: Cervical collar Activity Tolerance: Patient tolerated treatment well Patient left: in chair;with call bell/phone within reach;with family/visitor present     Time: [id number] PT Time Calculation (min) (ACUTE ONLY): 46 min  Charges:  $Gait Training: 23-37 mins $Therapeutic Activity: 8-22 mins                    G Codes:      Van ClinesGarrigan, Jacob [name] 08/05/2014, 10:02 AM  Van ClinesHolly Kyrie [name], PT  Acute Rehabilitation Services Pager [phone] Office [phone]

## 2014-08-05 NOTE — Progress Notes (Signed)
Inpatient Rehabilitation  I met with the patient and his wife and daughter at the bedside.  Graciela Namihira-Alfaro interpreted, though daughter speaks fluent [place].  Pt. And family desire IP Rehab if insurance will authorize.  Chart indicates pt. Has Blue Medicare however daughter says he does not have Blue Medicare and only has [employer].  I will clarify pt's insurance coverage and will initiate authorization process.  I have informed Wynelle Fanny, SW and [place], [place].  Raquel Sarna had communicated with [facility] and pt. Does not have [employer] which may reduce the likelihood of receiving insurance authorization for IP rehab.    Will follow up as soon as I have insurance decision.  Please call if questions.   [place] Admissions Coordinator Cell [phone] Office [phone]

## 2014-08-05 NOTE — Progress Notes (Signed)
Interpreter Wyvonnia DuskyGraciela Namihira [phone] for [place] PT

## 2014-08-05 NOTE — Clinical Social Work Psychosocial (Signed)
Clinical Social Work Department BRIEF PSYCHOSOCIAL ASSESSMENT 08/05/2014  Patient:  Jacob [name], Jacob [name]     Account Number:  [id number]     Admit date:  08/01/2014  Clinical Social Worker:  Delrae Sawyers  Date/Time:  08/05/2014 02:29 PM  Referred by:  Physician  Date Referred:  08/05/2014 Referred for  SNF Placement   Other Referral:   CIR placement.   Interview type:  Family Other interview type:   CSW spoke with pt's daughter, Daviyon Widmayer.    PSYCHOSOCIAL DATA Living Status:  FAMILY Admitted from facility:   Level of care:   Primary support name:  Aydien Majette Primary support relationship to patient:  CHILD, ADULT Degree of support available:   Strong support system.    CURRENT CONCERNS Current Concerns  Post-Acute Placement   Other Concerns:   none.    SOCIAL WORK ASSESSMENT / PLAN CSW received referral for possible SNF placement as an alternative to CIR placement at time of discharge. CSW met with pt, pt's wife, and pt's daughter at bedside to discuss discharge disposition. Per pt's daughter, pt and pt's wife currently live with pt's daughter and husband in [place], [place]. Pt's daughter informed CSW pt's daughter cares for both pt and pt's wife. Pt's daughter understanding of PT recommendation for CIR placement, but agreeable to SNF placement in the event that pt's insurance denies CIR placement. Pt's daughter expressed interest in SNF placement for pt in [place].    CSW to continue to follow and assist with discharge planning needs.   Assessment/plan status:  Psychosocial Support/Ongoing Assessment of Needs Other assessment/ plan:   none.   Information/referral to community resources:   [facility] bed offers.    PATIENT'S/FAMILY'S RESPONSE TO PLAN OF CARE: Pt's daughter understanding and agreeable to CSW plan of care. Pt's daughter expressed preference for CIR placement at time of discharge, but agreeable to SNF as an alternative plan. Pt's  daughter expressed no further questions or concerns at this time.       Lubertha Sayres, [place] ([phone]) Licensed Clinical Social Worker Orthopedics [phone]) and Surgical [phone])

## 2014-08-05 NOTE — Clinical Social Work Placement (Addendum)
Clinical Social Work Department CLINICAL SOCIAL WORK PLACEMENT NOTE 08/05/2014  Patient:  Jacob [name],Jacob [name]  Account Number:  [id number] Admit date:  08/01/2014  Clinical Social Worker:  Mosie EpsteinEMILY S Rodolfo Gaster, LCSWA  Date/time:  08/05/2014 02:35 PM  Clinical Social Work is seeking post-discharge placement for this patient at the following level of care:   SKILLED NURSING   (*CSW will update this form in Epic as items are completed)   08/05/2014  Patient/family provided with [facility] of Clinical Social Work's list of facilities offering this level of care within the geographic area requested by the patient (or if unable, by the patient's family).  08/05/2014  Patient/family informed of their freedom to choose among providers that offer the needed level of care, that participate in Medicare, Medicaid or managed care program needed by the patient, have an available bed and are willing to accept the patient.  08/05/2014  Patient/family informed of [facility]' ownership interest in [facility], as well as of the fact that they are under no obligation to receive care at this facility.  PASARR submitted to EDS on 08/05/2014 PASARR number received on 08/05/2014  FL2 transmitted to all facilities in geographic area requested by pt/family on  08/05/2014 FL2 transmitted to all facilities within larger geographic area on   Patient informed that his/her managed care company has contracts with or will negotiate with  certain facilities, including the following:     Patient/family informed of bed offers received:  08/05/2014 [facility] Physician recommends and patient chooses bed at    [facility] on 08/06/2014 Patient to be transferred to facility by [facility] Patient and family notified of transfer on 08/06/2014 Name of family member notified:  Pt's daughter, Jacob [name], updated regarding  discharge.  The following physician request were entered in Epic:   Additional Comments:  Lily Kochermily Meryn Sarracino, LCSWA [phone]) Licensed Clinical Social Worker Orthopedics [phone]([id number]) and Surgical [phone]([id number])

## 2014-08-05 NOTE — Progress Notes (Signed)
[facility] PROGRESS NOTE  Jacob [name] ZOX:[id number] DOB: 1917/06/16 DOA: 08/01/2014 PCP: Aida Puffer, MD  Assessment/Plan:  Principal Problem:   C4-5cervical fracture with cord contusion and probable ligamentous injury: patient and family have decided against surgery. Awaiting insurance authorization for CIR v. SNF Active Problems:   Chronic atrial fibrillation, rate controlled. No Coumadin or antiplatelet agents in the setting of acute C-spine fracture.   CAD in native artery -->  CABG x4 in 1994   Dyslipidemia, goal LDL below 70    Essential hypertension: stable   Elevated troponin:  EKG without acute ischemic changes and troponin trend is flat. No chest pain. Doubt acute coronary syndrome. Will defer further workup given patient's lack of symptoms and advanced age.   Hypothyroidism:  TSH low. Synthroid increased.   Dysphagia secondary to C-spine injury and swelling: Speech therapy has changed recommendation to D1 with pudding thickened liquids. Family requesting IVF in case unable to take enough liquid. It does not appear this was started. Notified RN Constipation: continue bowel regimen  Code Status:  DO NOT RESUSCITATE  Family Communication:  Wife at bedside Disposition Plan:  CIR v. SNF  Consultants:   neurosurgery   PM&R  Procedures:     Antibiotics:    HPI/Subjective: Per family, walked in hall. No BM. Eating ok  Objective: Filed Vitals:   08/05/14 1448  BP: 138/66  Pulse: 71  Temp: 97.5 F (36.4 C)  Resp: 18    Intake/Output Summary (Last 24 hours) at 08/05/14 1648 Last data filed at 08/04/14 1700  Gross per 24 hour  Intake     60 ml  Output      0 ml  Net     60 ml   Filed Weights   08/01/14 2253 08/03/14 0442  Weight: 72.4 kg (159 lb 9.8 oz) 72.2 kg (159 lb 2.8 oz)    Exam:   General:   Asleep: did not rouse  HEENT: neck brace  Cardiovascular:  irregularly irregular without murmurs gallops rubs   Respiratory:  clear to  auscultation bilaterally without wheezes rhonchi or rales   Abdomen:  soft nontender nondistended. Bowel sounds present.   Ext:  left fourth finger post amputation. No pedal edema.   Basic Metabolic Panel:  Recent Labs Lab 08/02/14 0106 08/02/14 0751  NA 141  --   K 4.4  --   CL 104  --   CO2 22  --   GLUCOSE 131*  --   BUN 23  --   CREATININE 0.99  --   CALCIUM 9.2  --   MG  --  1.9  PHOS  --  2.3   Liver Function Tests:  Recent Labs Lab 08/02/14 0106  AST 39*  ALT 20  ALKPHOS 114  BILITOT 0.8  PROT 7.2  ALBUMIN 3.3*   No results for input(s): LIPASE, AMYLASE in the last 168 hours. No results for input(s): AMMONIA in the last 168 hours. CBC:  Recent Labs Lab 08/02/14 0106  WBC 10.6*  NEUTROABS 8.6*  HGB 11.1*  HCT 33.7*  MCV 92.6  PLT 221   Cardiac Enzymes:  Recent Labs Lab 08/02/14 0106 08/02/14 0751 08/02/14 1316 08/03/14 0409  CKTOTAL  --   --   --  449*  TROPONINI 0.63* 0.65* 0.57*  --    BNP (last 3 results) No results for input(s): PROBNP in the last 8760 hours. CBG: No results for input(s): GLUCAP in the last 168 hours.  Recent Results (from the past 240  hour(s))  MRSA PCR Screening     Status: None   Collection Time: 08/01/14 11:26 PM  Result Value Ref Range Status   MRSA by PCR NEGATIVE NEGATIVE Final    Comment:        The GeneXpert MRSA Assay (FDA approved for NASAL specimens only), is one component of a comprehensive MRSA colonization surveillance program. It is not intended to diagnose MRSA infection nor to guide or monitor treatment for MRSA infections.   Culture, Urine     Status: None   Collection Time: 08/02/14  4:25 AM  Result Value Ref Range Status   Specimen Description URINE, CLEAN CATCH  Final   Special Requests NONE  Final   Culture  Setup Time   Final    08/02/2014 12:04 Performed at [facility] Count   Final    7,000 COLONIES/ML Performed at [facility]    Culture    Final    INSIGNIFICANT GROWTH Performed at [facility]    Report Status 08/03/2014 FINAL  Final     Studies: Dg Swallowing Func-speech Pathology  08/04/2014   Riley Nearing Deblois, CCC-SLP     08/04/2014 12:34 PM Objective Swallowing Evaluation: Modified Barium Swallowing Study   Patient Details  Name: Jacob [name] MRN: [id number] Date of Birth: [birth year]  Today's Date: 08/04/2014 Time: 4098-1191 SLP Time Calculation (min) (ACUTE ONLY): 42 min  Past Medical History:  Past Medical History  Diagnosis Date  . CAD in native artery -->  1994    referred for CABG  . S/P CABG x 4 1994    a) 1994: LIMA-LAD, SVG-RI, SVG-OM, SVG-RCA; b) 09/2006,  Persantine Myoview: fixed inferior defect consistent with  infarct/scar without peri-infarct ischemia;; 2-D echo: Mild-mod  concentric LVH. Normal EF.  Abnormal relaxation, aortic sclerosis  with mild-mod AI  . Chronic atrial fibrillation 12/15/2012  . Long term (current) use of anticoagulants 12/15/2012    warfarin  . Essential hypertension 05/23/2013  . Dyslipidemia, goal LDL below 70[272.4]      - on statin, followed by primary physician.   Marland Kitchen Glaucoma    Past Surgical History:  Past Surgical History  Procedure Laterality Date  . Coronary artery bypass graft  1994    LIMA-LAD, SVG-RI, SVG-OM, SVG-RCA   . Persantine myoview  February 2008    Fixed inferior wall defect-consistent with infarct/scar without  peri-infarct ischemia  . Transthoracic echocardiogram  February 2008    Mild/mod conc LVH, Nl Size & Fxn, Gr 1 DD(abnormal relaxation),  mild to moderate aortic regurgitation and mildly sclerotic aortic  valve.   HPI:  Jacob [name] is a 80 y.o. male with a pmhx significant for CAD,  s/p CABG x4 (1994), Chronic atrial fibrillation (11/2012), long  term use of anticoagulants, essential hypertension, dyslipidemia,  glaucoma who was brought to ER on 12/4 and found to have C4-C5  fracture with possible perivertibral hematoma; BSE ordered to r/o  aspiration d/t injury.       Assessment / Plan / Recommendation Clinical Impression  Dysphagia Diagnosis: Severe pharyngeal phase dysphagia;Severe  cervical esophageal phase dysphagia Clinical impression: Pt demonstrates a moderate to severe  oropharyngeal and cervical esophageal dysphagia due to appearance  of swelling at posterior pharyngeal wall and at cervical  esophagus. Pt seen to silently aspirate nectar and honey thick  liquids after the swallow as residue and backflow from tight  cervical esophagus fall to airway through interarytenoid space.  Changing bolus size reduced, but did  not eliminate, aspiration.  Pt unable to utilize postures as cervical collar impedes  movement. Pt is able to tolerate purees and puddings without  aspiration though multiple swallows are helpful to fully transit  bolus. Recommend a few days of Puree/pudding thick diet (no  liquids) until swelling improves to reduce risk of aspiration in  the short term. Pt may have ice chips after oral care. SLP will  follow for improvement.     Treatment Recommendation  Therapy as outlined in treatment plan below    Diet Recommendation Dysphagia 1 (Puree);Pudding-thick liquid   Liquid Administration via: Spoon Medication Administration: Crushed with puree (liquid meds ok -  thicken to pudding) Supervision: Staff to assist with self feeding Compensations: Slow rate;Small sips/bites;Multiple dry swallows  after each bite/sip;Clear throat intermittently Postural Changes and/or Swallow Maneuvers: Seated upright 90  degrees    Other  Recommendations Oral Care Recommendations: Oral care BID Other Recommendations: Order thickener from pharmacy;Prohibited  food (jello, ice cream, thin soups)   Follow Up Recommendations  Inpatient Rehab    Frequency and Duration min 3x week  2 weeks   Pertinent Vitals/Pain NA    SLP Swallow Goals     General HPI: Jacob [name] is a 80 y.o. male with a pmhx  significant for CAD, s/p CABG x4 (1994), Chronic atrial  fibrillation (11/2012), long term use  of anticoagulants, essential  hypertension, dyslipidemia, glaucoma who was brought to ER on  12/4 and found to have C4-C5 fracture with possible perivertibral  hematoma; BSE ordered to r/o aspiration d/t injury.  Type of Study: Modified Barium Swallowing Study Reason for Referral: Objectively evaluate swallowing function Diet Prior to this Study: Dysphagia 1 (puree);Nectar-thick  liquids Temperature Spikes Noted: No Respiratory Status: Room air History of Recent Intubation: No Behavior/Cognition: Alert;Cooperative;Pleasant mood Oral Cavity - Dentition: Dentures, top;Missing  dentition;Dentures, bottom Oral Motor / Sensory Function: Within functional limits Self-Feeding Abilities: Able to feed self;Needs assist Patient Positioning: Upright in chair Baseline Vocal Quality: Clear;Low vocal intensity Volitional Cough: Strong Volitional Swallow: Able to elicit Anatomy: Other (Comment) (apparent edema at posterior pharyngeal  wall) Pharyngeal Secretions: Not observed secondary MBS    Reason for Referral Objectively evaluate swallowing function   Oral Phase Oral Preparation/Oral Phase Oral Phase: WFL   Pharyngeal Phase Pharyngeal Phase Pharyngeal Phase: Impaired Pharyngeal - Honey Pharyngeal - Honey Cup: Penetration/Aspiration after  swallow;Reduced anterior laryngeal mobility;Moderate  aspiration;Pharyngeal residue - cp segment;Pharyngeal residue -  pyriform sinuses;Inter-arytenoid space residue Penetration/Aspiration details (honey cup): Material enters  airway, passes BELOW cords without attempt by patient to eject  out (silent aspiration) Pharyngeal - Nectar Pharyngeal - Nectar Teaspoon: Penetration/Aspiration after  swallow;Reduced anterior laryngeal mobility;Moderate  aspiration;Pharyngeal residue - cp segment;Pharyngeal residue -  pyriform sinuses;Inter-arytenoid space residue Penetration/Aspiration details (nectar teaspoon): Material enters  airway, passes BELOW cords without attempt by patient to eject  out  (silent aspiration) Pharyngeal - Nectar Cup: Penetration/Aspiration after  swallow;Reduced anterior laryngeal mobility;Moderate  aspiration;Pharyngeal residue - cp segment;Pharyngeal residue -  pyriform sinuses;Inter-arytenoid space residue Penetration/Aspiration details (nectar cup): Material enters  airway, passes BELOW cords without attempt by patient to eject  out (silent aspiration) Pharyngeal - Solids Pharyngeal - Puree: Reduced anterior laryngeal  mobility;Pharyngeal residue - cp segment;Pharyngeal residue -  pyriform sinuses;Inter-arytenoid space residue Penetration/Aspiration details (puree): Material does not enter  airway  Cervical Esophageal Phase    GO             Harlon DittyBonnie DeBlois, MA CCC-SLP [phone]  [name], Bonnie Caroline 08/04/2014, 12:32  PM     Scheduled Meds: . antiseptic oral rinse  7 mL Mouth Rinse q12n4p  . carvedilol  3.125 mg Oral BID WC  . chlorhexidine  15 mL Mouth Rinse BID  . latanoprost  1 drop Both Eyes QHS  . levothyroxine  100 mcg Oral QAC breakfast  . lisinopril  20 mg Oral q morning - 10a  . senna-docusate  2 tablet Oral Daily  . simvastatin  20 mg Oral QHS  . sodium chloride  3 mL Intravenous Q12H  . timolol  1 drop Both Eyes Daily   Continuous Infusions: . sodium chloride      Time spent: 15 minutes  Leela Vanbrocklin L  [facility] Text page [email or web address], password [email or web address] 08/05/2014, 4:48 PM  LOS: 4 days

## 2014-08-05 NOTE — Progress Notes (Signed)
Speech Language Pathology Treatment: Dysphagia  Patient Details Name: Jacob [name] MRN: [id number] DOB: February 25, 1917 Today's Date: 08/05/2014 Time: 1425-1440 SLP Time Calculation (min) (ACUTE ONLY): 15 min  Assessment / Plan / Recommendation Clinical Impression  F/u for dysphagia: Pt sitting in recliner; wife/daughter present for translation.  Dtr reports pt ate well today.  Provided oral care with assist from pt; pt self-fed ice chips after review of water protocol.  Presented with intermittent, mild throat-clearing post-swallow per recommendations.  Min cues overall for precautions.  Reiterated results of study and time needed to allow edema to subside.  Will continue to follow.     HPI HPI: Jacob [name] is a 80 y.o. male with a pmhx significant for CAD, s/p CABG x4 (1994), Chronic atrial fibrillation (11/2012), long term use of anticoagulants, essential hypertension, dyslipidemia, glaucoma who was brought to ER on 12/4 and found to have C4-C5 fracture with possible perivertibral hematoma; BSE ordered to r/o aspiration d/t injury.    Pertinent Vitals    SLP Plan  Continue with current plan of care    Recommendations Diet recommendations: Dysphagia 1 (puree);Pudding-thick liquid Liquids provided via: Teaspoon Medication Administration: Crushed with puree Supervision: Staff to assist with self feeding Compensations: Slow rate;Small sips/bites;Multiple dry swallows after each bite/sip;Clear throat intermittently Postural Changes and/or Swallow Maneuvers: Seated upright 90 degrees              Oral Care Recommendations: Oral care Q4 per protocol Follow up Recommendations: Inpatient Rehab Plan: Continue with current plan of care    GO     Jacob [name], Jacob [name] 08/05/2014, 3:00 PM

## 2014-08-06 MED ORDER — ACETAMINOPHEN 160 MG/5ML PO SOLN: 650.0000 mg | Freq: Four times a day (QID) | ORAL | Status: DC | PRN

## 2014-08-06 MED ORDER — SENNOSIDES-DOCUSATE SODIUM 8.6-50 MG PO TABS: 2.0000 | ORAL_TABLET | Freq: Every day | ORAL | Status: DC

## 2014-08-06 MED ORDER — HYDROCODONE-ACETAMINOPHEN 5-325 MG PO TABS: 1.0000 | ORAL_TABLET | Freq: Four times a day (QID) | ORAL | Status: DC | PRN

## 2014-08-06 MED ORDER — LEVOTHYROXINE SODIUM 100 MCG PO TABS: 100.0000 ug | ORAL_TABLET | Freq: Every morning | ORAL | Status: DC

## 2014-08-06 MED ORDER — MAGNESIUM HYDROXIDE 400 MG/5ML PO SUSP: 30.0000 mL | Freq: Every day | ORAL | Status: DC | PRN

## 2014-08-06 MED ORDER — BISACODYL 10 MG RE SUPP: 10.0000 mg | RECTAL | Status: DC | PRN

## 2014-08-06 NOTE — Progress Notes (Signed)
Physical Therapy Treatment Patient Details Name: Jacob LainRamon Donson MRN: [id number] DOB: [birth year] Today's Date: 08/06/2014    History of Present Illness 80 y.o. Spainish speaking gentleman [facility] s/p fall into a ditch (where pt laid a few hours); C4-5cervical fracture with cord contusion and probable ligamentous injury: Discussed with Dr. Venetia MaxonStern. Neck brace changed per his recommendations (vista).  Pt with significant PMHx of CAD, CABG, A-fib, anticoagulation, HTN, and glaucoma.     PT Comments    Noted dc plan update to going to SNF for rehab as insurance did not authorize CIR stay; [facility]our[facility] pt [facility]; anticipate good progress in post-acute rehab  Follow Up Recommendations  Supervision/Assistance - 24 hour;SNF     Equipment Recommendations  Rolling walker with 5" wheels;3in1 (PT) (pt may already have)    Recommendations for Other Services       Precautions / Restrictions Precautions Precautions: Fall;Cervical Precaution Comments: Dysphagia 3, Pudding thick liquids Required Braces or Orthoses: Cervical Brace Cervical Brace: Hard collar;At all times Restrictions Weight Bearing Restrictions: No    Mobility  Bed Mobility  Transfers Overall transfer level: Needs assistance Equipment used: Rolling walker (2 wheeled) Transfers: Sit to/from Stand Sit to Stand: Min assist         General transfer comment: Min assist to support trunk during transitions from both the recliner chair and the hallway bench.  Verbal cues to try to limit how much he used his hands, encouraged using his legs more.    Ambulation/Gait Ambulation/Gait assistance: Min assist Ambulation Distance (Feet): 120 Feet (with seated rest break) Assistive device: Rolling walker (2 wheeled)   Gait velocity: Verbal and tactile cues to stay slower and more controlled; improved from last session   General Gait Details: Cues for upright posture predominantly;  noted pt tended to speed up as we got closer to destination; tactile cueing at upper trunk, shoulders for more upright posture, and mod assist at times to keep RW close   Stairs            Wheelchair Mobility    Modified Rankin (Stroke Patients Only)       Balance     Sitting balance-Leahy Scale: Fair       Standing balance-Leahy Scale: Fair                      Cognition Arousal/Alertness: Awake/alert Behavior During Therapy: WFL for tasks assessed/performed Overall Cognitive Status: Within Functional Limits for tasks assessed                      Exercises Other Exercises Other Exercises: BUE RTC strengthening to 90 FF; 45 abd Other Exercises: B hand strengthening - grip and pinch with squeeze ball Other Exercises: core strengthening activities    General Comments General comments (skin integrity, edema, etc.): Graciela Namihira present for Spanish interpretation      Pertinent Vitals/Pain Pain Assessment: Faces Faces Pain Scale: Hurts little more Pain Location: neck and bil knees Pain Descriptors / Indicators: Aching Pain Intervention(s): Limited activity within patient's tolerance;Monitored during session    Home Living                      Prior Function            PT Goals (current goals can now be found in the care plan section) Acute Rehab PT Goals Patient Stated Goal: per daughter, to go  home, decrease pain PT Goal Formulation: With patient/family Time For Goal Achievement: 08/17/14 Potential to Achieve Goals: Good Progress towards PT goals: Progressing toward goals    Frequency  Min 4X/week    PT Plan Current plan remains appropriate    Co-evaluation             End of Session Equipment Utilized During Treatment: Cervical collar Activity Tolerance: Patient tolerated treatment well Patient left: in chair;with call bell/phone within reach;with family/visitor present     Time: 1520-1540 PT Time  Calculation (min) (ACUTE ONLY): 20 min  Charges:  $Gait Training: 8-22 mins                    G Codes:      Olen PelGarrigan, Rochanda Harpham Hamff 08/06/2014, 4:48 PM  [facility] ClinesHolly [place], [place]  Acute Rehabilitation Services Pager [phone] Office [phone]

## 2014-08-06 NOTE — Plan of Care (Signed)
Problem: Phase II Progression Outcomes Goal: Pain controlled Outcome: Completed/Met Date Met:  08/06/14 Goal: Progress activity as tolerated unless otherwise ordered Outcome: Completed/Met Date Met:  08/06/14 Goal: Tolerating diet Outcome: Completed/Met Date Met:  08/06/14

## 2014-08-06 NOTE — Progress Notes (Signed)
[employer] has denied approval to admit pt to inpt rehab. They will approve SNF. I have contacted pt's daughter, Kathie RhodesBetty, by phone and she is aware. I have also contacted RNCM, SW and Dr. Lendell CapriceSullivan. We will sign off. [phone]

## 2014-08-06 NOTE — Discharge Summary (Addendum)
Physician Discharge Summary  Jacob [name] ZOX:[id number] DOB: June 04, 1917 DOA: 08/01/2014  PCP: Jacob Puffer, MD  Admit date: 08/01/2014 Discharge date: 08/06/2014  Time spent: greater than 30 minutes  Recommendations for Outpatient Follow-up:  1. Continue speech therapy, occupational therapy, physical therapy at SNF  Discharge Diagnoses:  Principal Problem:   C4 cervical fracture Active Problems:   Chronic atrial fibrillation   Long term current use of anticoagulant therapy   CAD in native artery -->  CABG x4 in 1994   Dyslipidemia, goal LDL below 70 - on statin, followed by primary physician.   Essential hypertension   Elevated troponin   Hypothyroidism   Dysphagia constipation  Discharge Condition: stable  Diet recommendation: pureed with pudding thickened liquids. Speech therapy to continue to follow and advance diet as clinically appropriate  Code status: DNR  [facility] Weights   08/01/14 2253 08/03/14 0442  Weight: 72.4 kg (159 lb 9.8 oz) 72.2 kg (159 lb 2.8 oz)    History of present illness:  [name]   has a past medical history of CAD in native artery --> (1994); S/P CABG x 4 (1994); Chronic atrial fibrillation (12/15/2012); Long term (current) use of anticoagulants (12/15/2012); Essential hypertension (05/23/2013); Dyslipidemia, goal LDL below 70[272.4]; and Glaucoma.   Presented with  A fall into a ditch while walking. He saw a car and tried to get out of the way and slipped into a ditch. For 2 hours he was missing. Finally he was found and brought to [facility]. Patient endorses transiently losing consciousness after his fall. Denies any chest pain. In ER he was found to have C4-C5 fracture with possible perivertibral hematoma. [place] ER physician has discussed case with Neurosurgery Dr. Venetia [name] who felt at that point there was indication for operative intervention. Patient was transferred to [facility] stepdown for monitoring.   Hospital  Course:   admitted to stepdown. No focal deficits. Vitamin K given to reverse coumadin.  Neurosurgery consulted. MRI brain and c spine showed Traumatic disruption of the C4-5 disc space, anterior inferior corner fracture of C4, and posterior element fractures of C4-C5 are redemonstrated.  Mildly increased T2 signal within the cord, maximal at C4-5, best visualized on STIR imaging, without associated blood products. Findings consistent with cord contusion/central cord syndrome. No visible epidural hematoma.  Mild canal stenosis is worst at C4-5, multifactorial in nature, with canal diameter of approximately 7 mm.  Edema in the posterior soft tissues relates to muscular strain and probable injury of the interspinous ligaments.  Neurosurgery offered surgery for this unstable neck injury.  After much discussion, patient and family decided to forgo surgery and voiced understanding that patient may not heal, or worsen.  Patient should remain off coumadin indefinately.  Worked with PT, OT, speech therapy.  Speech therapy performed modified barium swallow and recommends pureed diet with pudding thickened liquids for now. As swelling improves, will hopefully able to advance diet per speech therapy recommendations.  Started on bowel regimen for constipation.  Follow up with Dr. Venetia [name] in one month.  Had elevated troponin on admission, but EKG without acute ischemic changes and troponin trend is flat. No chest pain. acute coronary syndrome unlikely. defer further workup given patient's lack of symptoms and advanced age.  Patient has h/o hypothyroidism on synthroid.  TSH low. Synthroid dose decreased to 100 mcg daily.  Procedures:  none  Consultations:  Neurosurgery  PM&R  Discharge Exam: Filed Vitals:   08/06/14 0626  BP: 137/90  Pulse: 84  Temp: 98.1 F (36.7 C)  Resp: 17    General: alert oriented, comfortable in chair eating lunch Neck: collar in place Cardiovascular: RRR without  MGR Respiratory: CTA without WRR Abd s, nt, nd Ext no CCE  Discharge Instructions    Discharge instructions    Complete by:  As directed   Must wear neck brace at all times     Walk with assistance    Complete by:  As directed           Current Discharge Medication List    START taking these medications   Details  acetaminophen (TYLENOL) 160 MG/5ML solution Take 20.3 mLs (650 mg total) by mouth every 6 (six) hours as needed for mild pain or fever (>= 101). Qty: 120 mL, Refills: 0    bisacodyl (DULCOLAX) 10 MG suppository Place 1 suppository (10 mg total) rectally as needed for moderate constipation. Qty: 12 suppository, Refills: 0    HYDROcodone-acetaminophen (NORCO/VICODIN) 5-325 MG per tablet Take 1-2 tablets by mouth every 6 (six) hours as needed for moderate pain. Qty: 30 tablet, Refills: 0    magnesium hydroxide (MILK OF MAGNESIA) 400 MG/5ML suspension Take 30 mLs by mouth daily as needed for moderate constipation. Qty: 360 mL, Refills: 0    senna-docusate (SENOKOT-S) 8.6-50 MG per tablet Take 2 tablets by mouth daily.      CONTINUE these medications which have CHANGED   Details  levothyroxine (SYNTHROID, LEVOTHROID) 100 MCG tablet Take 1 tablet (100 mcg total) by mouth every morning.      CONTINUE these medications which have NOT CHANGED   Details  carvedilol (COREG) 3.125 MG tablet Take 1 tablet (3.125 mg total) by mouth 2 (two) times daily. Qty: 60 tablet, Refills: 11    latanoprost (XALATAN) 0.005 % ophthalmic solution Place 1 drop into both eyes at bedtime.    lisinopril (PRINIVIL,ZESTRIL) 20 MG tablet Take 20 mg by mouth every morning.    Nutritional Supplements (BOOST HIGH PROTEIN PO) Take 1 Bottle by mouth daily as needed (Dietary supplement).    simvastatin (ZOCOR) 20 MG tablet Take 1 tablet (20 mg total) by mouth at bedtime. Qty: 30 tablet, Refills: 11    timolol (TIMOPTIC) 0.5 % ophthalmic solution Place 1 drop into both eyes every morning.        STOP taking these medications     warfarin (COUMADIN) 5 MG tablet        No Known Allergies Follow-up Information    Follow up with STERN,JOSEPH D, MD In 1 month.   Specialty:  Neurosurgery   Contact information:   [address] [place] [address] [phone]        The results of significant diagnostics from this hospitalization (including imaging, microbiology, ancillary and laboratory) are listed below for reference.    Significant Diagnostic Studies: Mr Sherrin Daisy Contrast  08/02/2014   CLINICAL DATA:  Larey Seat into a ditch while walking yesterday. Loss of consciousness. Neck pain. Initial encounter.  EXAM: MRI HEAD WITHOUT CONTRAST  MRI CERVICAL SPINE WITHOUT CONTRAST  TECHNIQUE: Multiplanar, multiecho pulse sequences of the brain and surrounding structures, and cervical spine, to include the craniocervical junction and cervicothoracic junction, were obtained without intravenous contrast.  COMPARISON:  CT head and cervical spine 08/01/2014.  FINDINGS: MRI HEAD FINDINGS  No evidence for acute infarction, hemorrhage, mass lesion, hydrocephalus, or extra-axial fluid. Generalized cerebral and cerebellar atrophy. Advanced T2 and FLAIR hyperintensity throughout the periventricular and subcortical white matter representing chronic microvascular ischemic  change. No midline abnormality. Tiny microbleeds are seen throughout the parenchyma, likely sequelae of hypertensive cerebrovascular disease, particularly in the setting of dilated perivascular spaces and small vessel disease. No occult subdural or epidural hematoma.  MRI CERVICAL SPINE FINDINGS  Alignment: No subluxation, although there is abnormal widening of the C4-5 disc space related to fracture.  Vertebrae: The C4 anterior inferior corner fracture is redemonstrated with slight bone marrow edema. No compression deformity. Posterior element fractures on the RIGHT at C4 and C5 are redemonstrated.  Cord: Abnormal cord signal at  C3-4 and C4-5 without intramedullary hemorrhage, consistent with cord contusion/central cord syndrome.  Posterior Fossa: Unremarkable.  Vertebral Arteries: Equal in size and patent.  Paraspinal tissues: Dorsally, there is significant edema likely representing interspinous ligament strain or disruption. Paravertebral musculature are edematous on the RIGHT and LEFT most notably at the C4 and C5 levels likely representing strain.  Disc levels:  The individual disc spaces were examined as follows:  C2-3: Disc space narrowing with shallow central disc osteophyte complex. No impingement.  C3-4: Disc space narrowing. BILATERAL facet arthropathy and uncinate spurring. Central disc osteophyte complex. Mild ossification posterior longitudinal ligament. BILATERAL C4 nerve root impingement due to foraminal narrowing. Spinal canal adequate with sagittal diameter of 9 mm although slight effacement anterior subarachnoid space noted.  C4-5: Asymmetric LEFT-sided uncinate spurring and facet arthropathy. Shallow central disc protrusion. Posterior element indentation of the dorsal subarachnoid space. Mild canal stenosis with diameter 7 mm. Minimal cord flattening.  C5-6: Severe disc space narrowing. BILATERAL uncinate spurring. Shallow central disc osteophyte complex without cord compression. BILATERAL foraminal narrowing.  C6-7: BILATERAL uncinate spurring. Central ossification of the posterior longitudinal ligament. No definite cord compression. RIGHT greater than LEFT C7 nerve root impingement in the foramen is suspected.  C7-T1:  Disc space narrowing.  Facet arthropathy.  No impingement.  IMPRESSION: No acute intracranial findings. 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  Traumatic disruption of the C4-5 disc space, anterior inferior corner fracture of C4, and posterior element fractures of C4-C5 are redemonstrated.  Mildly increased T2 signal within the cord, maximal at C4-5, best visualized on  STIR imaging, without associated blood products. Findings consistent with cord contusion/central cord syndrome. No visible epidural hematoma.  Mild canal stenosis is worst at C4-5, multifactorial in nature, with canal diameter of approximately 7 mm.  Edema in the posterior soft tissues relates to muscular strain and probable injury of the interspinous ligaments. No traumatic subluxation is evident however.  Multilevel spondylosis elsewhere as described.   Electronically Signed   By: Davonna BellingJohn  Curnes M.D.   On: 08/02/2014 13:17   Mr Cervical Spine Wo Contrast  08/02/2014   CLINICAL DATA:  Larey SeatFell into a ditch while walking yesterday. Loss of consciousness. Neck pain. Initial encounter.  EXAM: MRI HEAD WITHOUT CONTRAST  MRI CERVICAL SPINE WITHOUT CONTRAST  TECHNIQUE: Multiplanar, multiecho pulse sequences of the brain and surrounding structures, and cervical spine, to include the craniocervical junction and cervicothoracic junction, were obtained without intravenous contrast.  COMPARISON:  CT head and cervical spine 08/01/2014.  FINDINGS: MRI HEAD FINDINGS  No evidence for acute infarction, hemorrhage, mass lesion, hydrocephalus, or extra-axial fluid. Generalized cerebral and cerebellar atrophy. Advanced T2 and FLAIR hyperintensity throughout the periventricular and subcortical white matter representing chronic microvascular ischemic change. No midline abnormality. Tiny microbleeds are seen throughout the parenchyma, likely sequelae of hypertensive cerebrovascular disease, particularly in the setting of dilated perivascular spaces and small vessel disease. No occult subdural or epidural hematoma.  MRI CERVICAL SPINE FINDINGS  Alignment: No subluxation, although there is abnormal widening of the C4-5 disc space related to fracture.  Vertebrae: The C4 anterior inferior corner fracture is redemonstrated with slight bone marrow edema. No compression deformity. Posterior element fractures on the RIGHT at C4 and C5 are  redemonstrated.  Cord: Abnormal cord signal at C3-4 and C4-5 without intramedullary hemorrhage, consistent with cord contusion/central cord syndrome.  Posterior Fossa: Unremarkable.  Vertebral Arteries: Equal in size and patent.  Paraspinal tissues: Dorsally, there is significant edema likely representing interspinous ligament strain or disruption. Paravertebral musculature are edematous on the RIGHT and LEFT most notably at the C4 and C5 levels likely representing strain.  Disc levels:  The individual disc spaces were examined as follows:  C2-3: Disc space narrowing with shallow central disc osteophyte complex. No impingement.  C3-4: Disc space narrowing. BILATERAL facet arthropathy and uncinate spurring. Central disc osteophyte complex. Mild ossification posterior longitudinal ligament. BILATERAL C4 nerve root impingement due to foraminal narrowing. Spinal canal adequate with sagittal diameter of 9 mm although slight effacement anterior subarachnoid space noted.  C4-5: Asymmetric LEFT-sided uncinate spurring and facet arthropathy. Shallow central disc protrusion. Posterior element indentation of the dorsal subarachnoid space. Mild canal stenosis with diameter 7 mm. Minimal cord flattening.  C5-6: Severe disc space narrowing. BILATERAL uncinate spurring. Shallow central disc osteophyte complex without cord compression. BILATERAL foraminal narrowing.  C6-7: BILATERAL uncinate spurring. Central ossification of the posterior longitudinal ligament. No definite cord compression. RIGHT greater than LEFT C7 nerve root impingement in the foramen is suspected.  C7-T1:  Disc space narrowing.  Facet arthropathy.  No impingement.  IMPRESSION: No acute intracranial findings. 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  Traumatic disruption of the C4-5 disc space, anterior inferior corner fracture of C4, and posterior element fractures of C4-C5 are redemonstrated.  Mildly increased T2 signal within  the cord, maximal at C4-5, best visualized on STIR imaging, without associated blood products. Findings consistent with cord contusion/central cord syndrome. No visible epidural hematoma.  Mild canal stenosis is worst at C4-5, multifactorial in nature, with canal diameter of approximately 7 mm.  Edema in the posterior soft tissues relates to muscular strain and probable injury of the interspinous ligaments. No traumatic subluxation is evident however.  Multilevel spondylosis elsewhere as described.   Electronically Signed   By: Davonna BellingJohn  Curnes M.D.   On: 08/02/2014 13:17   Dg Swallowing Func-speech Pathology  08/04/2014   Riley NearingBonnie Caroline Deblois, CCC-SLP     08/04/2014 12:34 PM Objective Swallowing Evaluation: Modified Barium Swallowing Study   Patient Details  Name: Jacob LainRamon [name] MRN: [id number] Date of Birth: [birth year]  Today's Date: 08/04/2014 Time: [id number] SLP Time Calculation (min) (ACUTE ONLY): 42 min  Past Medical History:  Past Medical History  Diagnosis Date  . CAD in native artery -->  1994    referred for CABG  . S/P CABG x 4 1994    a) 1994: LIMA-LAD, SVG-RI, SVG-OM, SVG-RCA; b) 09/2006,  Persantine Myoview: fixed inferior defect consistent with  infarct/scar without peri-infarct ischemia;; 2-D echo: Mild-mod  concentric LVH. Normal EF.  Abnormal relaxation, aortic sclerosis  with mild-mod AI  . Chronic atrial fibrillation 12/15/2012  . Long term (current) use of anticoagulants 12/15/2012    warfarin  . Essential hypertension 05/23/2013  . Dyslipidemia, goal LDL below 70[272.4]      - on statin, followed by primary physician.   Marland Kitchen. Glaucoma    Past Surgical History:  Past Surgical History  Procedure Laterality Date  . Coronary artery bypass graft  1994    LIMA-LAD, SVG-RI, SVG-OM, SVG-RCA   . Persantine myoview  February 2008    Fixed inferior wall defect-consistent with infarct/scar without  peri-infarct ischemia  . Transthoracic echocardiogram  February 2008    Mild/mod conc LVH, Nl Size & Fxn, Gr 1  DD(abnormal relaxation),  mild to moderate aortic regurgitation and mildly sclerotic aortic  valve.   HPI:  Wrangler Penning is a 80 y.o. male with a pmhx significant for CAD,  s/p CABG x4 (1994), Chronic atrial fibrillation (11/2012), long  term use of anticoagulants, essential hypertension, dyslipidemia,  glaucoma who was brought to ER on 12/4 and found to have C4-C5  fracture with possible perivertibral hematoma; BSE ordered to r/o  aspiration d/t injury.      Assessment / Plan / Recommendation Clinical Impression  Dysphagia Diagnosis: Severe pharyngeal phase dysphagia;Severe  cervical esophageal phase dysphagia Clinical impression: Pt demonstrates a moderate to severe  oropharyngeal and cervical esophageal dysphagia due to appearance  of swelling at posterior pharyngeal wall and at cervical  esophagus. Pt seen to silently aspirate nectar and honey thick  liquids after the swallow as residue and backflow from tight  cervical esophagus fall to airway through interarytenoid space.  Changing bolus size reduced, but did not eliminate, aspiration.  Pt unable to utilize postures as cervical collar impedes  movement. Pt is able to tolerate purees and puddings without  aspiration though multiple swallows are helpful to fully transit  bolus. Recommend a few days of Puree/pudding thick diet (no  liquids) until swelling improves to reduce risk of aspiration in  the short term. Pt may have ice chips after oral care. SLP will  follow for improvement.     Treatment Recommendation  Therapy as outlined in treatment plan below    Diet Recommendation Dysphagia 1 (Puree);Pudding-thick liquid   Liquid Administration via: Spoon Medication Administration: Crushed with puree (liquid meds ok -  thicken to pudding) Supervision: Staff to assist with self feeding Compensations: Slow rate;Small sips/bites;Multiple dry swallows  after each bite/sip;Clear throat intermittently Postural Changes and/or Swallow Maneuvers: Seated upright 90  degrees     Other  Recommendations Oral Care Recommendations: Oral care BID Other Recommendations: Order thickener from pharmacy;Prohibited  food (jello, ice cream, thin soups)   Follow Up Recommendations  Inpatient Rehab    Frequency and Duration min 3x week  2 weeks   Pertinent Vitals/Pain NA    SLP Swallow Goals     General HPI: Breydan Shillingburg is a 80 y.o. male with a pmhx  significant for CAD, s/p CABG x4 (1994), Chronic atrial  fibrillation (11/2012), long term use of anticoagulants, essential  hypertension, dyslipidemia, glaucoma who was brought to ER on  12/4 and found to have C4-C5 fracture with possible perivertibral  hematoma; BSE ordered to r/o aspiration d/t injury.  Type of Study: Modified Barium Swallowing Study Reason for Referral: Objectively evaluate swallowing function Diet Prior to this Study: Dysphagia 1 (puree);Nectar-thick  liquids Temperature Spikes Noted: No Respiratory Status: Room air History of Recent Intubation: No Behavior/Cognition: Alert;Cooperative;Pleasant mood Oral Cavity - Dentition: Dentures, top;Missing  dentition;Dentures, bottom Oral Motor / Sensory Function: Within functional limits Self-Feeding Abilities: Able to feed self;Needs assist Patient Positioning: Upright in chair Baseline Vocal Quality: Clear;Low vocal intensity Volitional Cough: Strong Volitional Swallow: Able to elicit Anatomy: Other (Comment) (apparent edema at posterior pharyngeal  wall) Pharyngeal Secretions: Not observed secondary MBS    Reason for Referral Objectively evaluate  swallowing function   Oral Phase Oral Preparation/Oral Phase Oral Phase: WFL   Pharyngeal Phase Pharyngeal Phase Pharyngeal Phase: Impaired Pharyngeal - Honey Pharyngeal - Honey Cup: Penetration/Aspiration after  swallow;Reduced anterior laryngeal mobility;Moderate  aspiration;Pharyngeal residue - cp segment;Pharyngeal residue -  pyriform sinuses;Inter-arytenoid space residue Penetration/Aspiration details (honey cup): Material enters  airway,  passes BELOW cords without attempt by patient to eject  out (silent aspiration) Pharyngeal - Nectar Pharyngeal - Nectar Teaspoon: Penetration/Aspiration after  swallow;Reduced anterior laryngeal mobility;Moderate  aspiration;Pharyngeal residue - cp segment;Pharyngeal residue -  pyriform sinuses;Inter-arytenoid space residue Penetration/Aspiration details (nectar teaspoon): Material enters  airway, passes BELOW cords without attempt by patient to eject  out (silent aspiration) Pharyngeal - Nectar Cup: Penetration/Aspiration after  swallow;Reduced anterior laryngeal mobility;Moderate  aspiration;Pharyngeal residue - cp segment;Pharyngeal residue -  pyriform sinuses;Inter-arytenoid space residue Penetration/Aspiration details (nectar cup): Material enters  airway, passes BELOW cords without attempt by patient to eject  out (silent aspiration) Pharyngeal - Solids Pharyngeal - Puree: Reduced anterior laryngeal  mobility;Pharyngeal residue - cp segment;Pharyngeal residue -  pyriform sinuses;Inter-arytenoid space residue Penetration/Aspiration details (puree): Material does not enter  airway  Cervical Esophageal Phase    GO             Harlon Ditty, MA CCC-SLP [phone]  Dyanne Iha Riley Nearing 08/04/2014, 12:32 PM     Microbiology: Recent Results (from the past 240 hour(s))  MRSA PCR Screening     Status: None   Collection Time: 08/01/14 11:26 PM  Result Value Ref Range Status   MRSA by PCR NEGATIVE NEGATIVE Final    Comment:        The GeneXpert MRSA Assay (FDA approved for NASAL specimens only), is one component of a comprehensive MRSA colonization surveillance program. It is not intended to diagnose MRSA infection nor to guide or monitor treatment for MRSA infections.   Culture, Urine     Status: None   Collection Time: 08/02/14  4:25 AM  Result Value Ref Range Status   Specimen Description URINE, CLEAN CATCH  Final   Special Requests NONE  Final   Culture  Setup Time   Final    08/02/2014  12:04 Performed at [facility] Count   Final    7,000 COLONIES/ML Performed at [facility]    Culture   Final    INSIGNIFICANT GROWTH Performed at [facility]    Report Status 08/03/2014 FINAL  Final     Labs: Basic Metabolic Panel:  Recent Labs Lab 08/02/14 0106 08/02/14 0751  NA 141  --   K 4.4  --   CL 104  --   CO2 22  --   GLUCOSE 131*  --   BUN 23  --   CREATININE 0.99  --   CALCIUM 9.2  --   MG  --  1.9  PHOS  --  2.3   Liver Function Tests:  Recent Labs Lab 08/02/14 0106  AST 39*  ALT 20  ALKPHOS 114  BILITOT 0.8  PROT 7.2  ALBUMIN 3.3*   No results for input(s): LIPASE, AMYLASE in the last 168 hours. No results for input(s): AMMONIA in the last 168 hours. CBC:  Recent Labs Lab 08/02/14 0106  WBC 10.6*  NEUTROABS 8.6*  HGB 11.1*  HCT 33.7*  MCV 92.6  PLT 221   Cardiac Enzymes:  Recent Labs Lab 08/02/14 0106 08/02/14 0751 08/02/14 1316 08/03/14 0409  CKTOTAL  --   --   --  449*  TROPONINI 0.63* 0.65* 0.57*  --    BNP: BNP (last 3 results) No results for input(s): PROBNP in the last 8760 hours. CBG: No results for input(s): GLUCAP in the last 168 hours.     SignedChristiane Ha  [facility] 08/06/2014, 1:33 PM

## 2014-08-06 NOTE — Clinical Social Work Note (Addendum)
[facility]. Pt's daughter, Kathie RhodesBetty, updated regarding discharge.  [facility]*[employer] Medicare authorization received by [facility]* Authorization number: [id number]  [facility] Report number: [phone] Transportation: Pt's daughter, Kathie RhodesBetty, to transport pt via personal vehicle.  Marcelline Deistmily Myah Guynes, LCSWA [phone]) Licensed Clinical Social Worker Orthopedics [phone]([id number]) and Surgical [phone]([id number])

## 2014-08-06 NOTE — Progress Notes (Signed)
Occupational Therapy Treatment Patient Details Name: Jacob LainRamon [name] MRN: [id number] DOB: May 15, 1917 Today's Date: 08/06/2014    History of present illness 80 y.o. Spainish speaking gentleman [facility]14 s/p fall into a ditch (where pt laid a few hours); C4-5cervical fracture with cord contusion and probable ligamentous injury: Discussed with Dr. Venetia MaxonStern. Neck brace changed per his recommendations (vista).  Pt with significant PMHx of CAD, CABG, A-fib, anticoagulation, HTN, and glaucoma.    OT comments  Interpreter Darrelyn Hillock(Graciella) used during session. Pt demonstrates increased BUE strength and core control. Increasing independence with basic ADL. Able to feed self after set up. Plan is for pt to d/C to SNF for rehab. Family present for session.   Follow Up Recommendations  SNF;Supervision/Assistance - 24 hour    Equipment Recommendations  Other (comment) (TBD by SNF)    Recommendations for Other Services      Precautions / Restrictions Precautions Precautions: Fall;Cervical Precaution Comments: Dysphagia 1, Pudding thick liquids Required Braces or Orthoses: Cervical Brace Cervical Brace: Hard collar;At all times Restrictions Weight Bearing Restrictions: No          Balance     Sitting balance-Leahy Scale: Fair (posterior bias at times)                             ADL       Grooming: Wash/dry face;Set up;Sitting                                                                        Cognition   Behavior During Therapy: WFL for tasks assessed/performed Overall Cognitive Status: Within Functional Limits for tasks assessed                       Extremity/Trunk Assessment   Increasing strength B. @ 3+/5 B. Weak rotator cuff B. Able to complete FF to 90 B, abduction to 45 B; subsittuates with scapular movement due to weak shoulder muscles            Exercises Other Exercises Other Exercises: BUE RTC  strengthening to 90 FF; 45 abd Other Exercises: B hand strengthening - grip and pinch with squeeze ball Other Exercises: core strengthening activities   Shoulder Instructions       General Comments  good participation    Pertinent Vitals/ Pain       Pain Assessment: Faces Faces Pain Scale: Hurts a little bit Pain Location: neck Pain Intervention(s): Monitored during session                                                          Frequency Min 2X/week     Progress Toward Goals  OT Goals(current goals can now be found in the care plan section)  Progress towards OT goals: Progressing toward goals  Acute Rehab OT Goals Patient Stated Goal: per daughter, to go home, decrease pain OT Goal Formulation: With patient Time For Goal Achievement: 08/11/14 Potential to Achieve Goals: Good ADL Goals Pt Will  Perform Grooming: with set-up;with supervision;standing Pt Will Perform Upper Body Bathing: with set-up;with supervision;sitting Pt Will Perform Lower Body Bathing: with min guard assist;with adaptive equipment;sit to/from stand Pt Will Perform Upper Body Dressing: with min assist;sitting Pt Will Perform Lower Body Dressing: with min guard assist;with adaptive equipment;sit to/from stand Pt Will Transfer to Toilet: with supervision;ambulating;grab bars Pt Will Perform Toileting - Clothing Manipulation and hygiene: with min assist;sit to/from stand Pt/caregiver will Perform Home Exercise Program: Increased ROM;Increased strength;Both right and left upper extremity;With Supervision;With written HEP provided Additional ADL Goal #1: Pt will be S for in and OOB for BADLs  Plan Discharge plan needs to be updated                     End of Session     Activity Tolerance Patient tolerated treatment well   Patient Left in chair;with call bell/phone within reach;with family/visitor present;Other (comment) (interpreter)   Nurse Communication Mobility  status;Precautions        Time: [id number] OT Time Calculation (min): 26 min  Charges: OT General Charges $OT Visit: 1 Procedure OT Treatments $Therapeutic Activity: 23-37 mins  Aaren Krog,HILLARY 08/06/2014, 4:12 PM   [facility] Cliffie Gingras, OTR/L  [phone] 08/06/2014

## 2014-08-11 ENCOUNTER — Ambulatory Visit: Payer: Commercial Managed Care - HMO | Admitting: Pharmacist Clinician (PhC)/ Clinical Pharmacy Specialist

## 2014-08-11 ENCOUNTER — Ambulatory Visit (HOSPITAL_COMMUNITY)
Admission: RE | Admit: 2014-08-11 | Discharge: 2014-08-11 | Disposition: A | Discharge status: Home / Self Care | Payer: 59 | Source: Ambulatory Visit | Attending: Internal Medicine | Admitting: Internal Medicine

## 2014-08-11 ENCOUNTER — Other Ambulatory Visit (HOSPITAL_COMMUNITY): Payer: Self-pay | Admitting: Internal Medicine

## 2014-08-11 DIAGNOSIS — S12300D Unspecified displaced fracture of fourth cervical vertebra, subsequent encounter for fracture with routine healing: Secondary | ICD-10-CM | POA: Insufficient documentation

## 2014-08-11 DIAGNOSIS — R1313 Dysphagia, pharyngeal phase: Secondary | ICD-10-CM | POA: Diagnosis not present

## 2014-08-11 DIAGNOSIS — R1310 Dysphagia, unspecified: Secondary | ICD-10-CM

## 2014-08-11 DIAGNOSIS — I10 Essential (primary) hypertension: Secondary | ICD-10-CM | POA: Insufficient documentation

## 2014-08-11 DIAGNOSIS — S12400D Unspecified displaced fracture of fifth cervical vertebra, subsequent encounter for fracture with routine healing: Secondary | ICD-10-CM | POA: Diagnosis not present

## 2014-08-11 DIAGNOSIS — R1311 Dysphagia, oral phase: Secondary | ICD-10-CM | POA: Diagnosis not present

## 2014-08-11 DIAGNOSIS — Z951 Presence of aortocoronary bypass graft: Secondary | ICD-10-CM | POA: Insufficient documentation

## 2014-08-11 DIAGNOSIS — E785 Hyperlipidemia, unspecified: Secondary | ICD-10-CM | POA: Diagnosis not present

## 2014-08-11 DIAGNOSIS — I2510 Atherosclerotic heart disease of native coronary artery without angina pectoris: Secondary | ICD-10-CM | POA: Insufficient documentation

## 2014-08-11 DIAGNOSIS — Z7901 Long term (current) use of anticoagulants: Secondary | ICD-10-CM | POA: Insufficient documentation

## 2014-08-11 DIAGNOSIS — X58XXXD Exposure to other specified factors, subsequent encounter: Secondary | ICD-10-CM | POA: Diagnosis not present

## 2014-08-11 DIAGNOSIS — I482 Chronic atrial fibrillation: Secondary | ICD-10-CM | POA: Diagnosis not present

## 2014-08-11 DIAGNOSIS — R1319 Other dysphagia: Secondary | ICD-10-CM | POA: Diagnosis not present

## 2014-08-11 NOTE — Procedures (Signed)
Objective Swallowing Evaluation: Modified Barium Swallowing Study  Patient Details  Name: Jacob [name] MRN: [id number] Date of Birth: 11-May-1917  Today's Date: 08/11/2014 Time: 1150-1223 SLP Time Calculation (min) (ACUTE ONLY): 33 min  Past Medical History:  Past Medical History  Diagnosis Date  . CAD in native artery -->  1994    referred for CABG  . S/P CABG x 4 1994    a) 1994: LIMA-LAD, SVG-RI, SVG-OM, SVG-RCA; b) 09/2006, Persantine Myoview: fixed inferior defect consistent with infarct/scar without peri-infarct ischemia;; 2-D echo: Mild-mod concentric LVH. Normal EF.  Abnormal relaxation, aortic sclerosis with mild-mod AI  . Chronic atrial fibrillation 12/15/2012  . Long term (current) use of anticoagulants 12/15/2012    warfarin  . Essential hypertension 05/23/2013  . Dyslipidemia, goal LDL below 70[272.4]      - on statin, followed by primary physician.   Marland Kitchen. Glaucoma    Past Surgical History:  Past Surgical History  Procedure Laterality Date  . Coronary artery bypass graft  1994    LIMA-LAD, SVG-RI, SVG-OM, SVG-RCA   . Persantine myoview  February 2008    Fixed inferior wall defect-consistent with infarct/scar without peri-infarct ischemia  . Transthoracic echocardiogram  February 2008    Mild/mod conc LVH, Nl Size & Fxn, Gr 1 DD(abnormal relaxation), mild to moderate aortic regurgitation and mildly sclerotic aortic valve.   HPI:  Jacob [name] is a 80 y.o. male with a pmhx significant for CAD, s/p CABG x4 (1994), Chronic atrial fibrillation (11/2012), long term use of anticoagulants, essential hypertension, dyslipidemia, glaucoma who was brought to ER on 12/4 and found to have C4-C5 fracture with possible perivertibral hematoma; MBS an 12/8 recommended Dys 1/pudding thick liquids. Pt d/c'd to SNF on 12/10, returned for OPMBS to determine readiness for upgrade.      Assessment / Plan / Recommendation Clinical Impression  Dysphagia Diagnosis: Moderate cervical esophageal  phase dysphagia;Moderate pharyngeal phase dysphagia;Mild oral phase dysphagia Clinical impression: Pt demonstrates improvement in swallow function including increased strength of hyolaryngeal musculature, reduced severity of residuals and improved and consistent ability to expel penetrate with hard throat clearing.   Edema of posterior pharyngeal wall continues to produce limited opening of UES and appearance of partially obliterated pyriform space, contributing to penetration of bolus to airway.  Pt has a slight delay in swallowing which leads to episodes of aspiration before/during the swallow with thin liquids and large nectar boluses as they push against tight UES. When restricted to small nectar sips or teaspoon quantities there was only mild penetration of residuals post swallow that pt clears with throat clear strategy and multiple effortful swallows. Recommend upgrade to soft solids and ground meats with nectar thick liquids. Pt will need to clear throat and swallow 2-3x each bite and alternate solids and liquids to clear residuals.     Treatment Recommendation  Defer treatment plan to SLP at (Comment) (SNF)    Diet Recommendation Dysphagia 3 (Mechanical Soft);Nectar-thick liquid (with ground meats, extra gravy/sauce)   Liquid Administration via: Spoon;Cup (small sips) Medication Administration: Crushed with puree Supervision: Full supervision/cueing for compensatory strategies;Staff to assist with self feeding Compensations: Slow rate;Small sips/bites;Multiple dry swallows after each bite/sip;Follow solids with liquid;Clear throat after each swallow;Effortful swallow Postural Changes and/or Swallow Maneuvers: Out of bed for meals    Other  Recommendations Oral Care Recommendations: Oral care BID   Follow Up Recommendations  Skilled Nursing facility    Frequency and Duration        Pertinent Vitals/Pain NA  SLP Swallow Goals     General HPI: Jacob [name] is a 80 y.o. male  with a pmhx significant for CAD, s/p CABG x4 (1994), Chronic atrial fibrillation (11/2012), long term use of anticoagulants, essential hypertension, dyslipidemia, glaucoma who was brought to ER on 12/4 and found to have C4-C5 fracture with possible perivertibral hematoma; MBS an 12/8 recommended Dys 1/pudding thick liquids. Pt d/c'd to SNF on 12/10, returned for OPMBS to determine readiness for upgrade.  Type of Study: Modified Barium Swallowing Study Reason for Referral: Objectively evaluate swallowing function Previous Swallow Assessment: see HPI Diet Prior to this Study: Dysphagia 1 (puree);Pudding-thick liquids Temperature Spikes Noted: N/A Respiratory Status: Room air History of Recent Intubation: No Behavior/Cognition: Alert;Cooperative;Pleasant mood Oral Cavity - Dentition: Dentures, top;Dentures, bottom Oral Motor / Sensory Function: Within functional limits Self-Feeding Abilities: Able to feed self Patient Positioning: Upright in chair Baseline Vocal Quality: Clear Volitional Cough: Strong Volitional Swallow: Able to elicit Anatomy:  (Apparent edema of posterior pharyngeal wall/CP segment) Pharyngeal Secretions: Not observed secondary MBS    Reason for Referral Objectively evaluate swallowing function   Oral Phase Oral Preparation/Oral Phase Oral Phase: Impaired Oral - Nectar Oral - Nectar Cup: Delayed oral transit;Lingual pumping (swishing) Oral - Thin Oral - Thin Cup: Delayed oral transit;Lingual pumping Oral - Solids Oral - Puree: Delayed oral transit;Lingual pumping Oral - Mechanical Soft: Delayed oral transit;Lingual pumping Oral - Pill: Delayed oral transit;Lingual pumping   Pharyngeal Phase Pharyngeal Phase Pharyngeal Phase: Impaired Pharyngeal - Honey Pharyngeal - Honey Cup: Not tested Pharyngeal - Nectar Pharyngeal - Nectar Teaspoon: Reduced pharyngeal peristalsis;Reduced epiglottic inversion;Reduced tongue base retraction;Pharyngeal residue -  valleculae;Pharyngeal residue - posterior pharnyx;Delayed swallow initiation Penetration/Aspiration details (nectar teaspoon): Material does not enter airway Pharyngeal - Nectar Cup: Reduced pharyngeal peristalsis;Reduced epiglottic inversion;Reduced tongue base retraction;Pharyngeal residue - valleculae;Pharyngeal residue - posterior pharnyx;Delayed swallow initiation;Penetration/Aspiration before swallow;Penetration/Aspiration during swallow;Moderate aspiration Penetration/Aspiration details (nectar cup): Material does not enter airway;Material enters airway, passes BELOW cords without attempt by patient to eject out (silent aspiration) Pharyngeal - Thin Pharyngeal - Thin Cup: Reduced pharyngeal peristalsis;Reduced epiglottic inversion;Reduced tongue base retraction;Pharyngeal residue - valleculae;Pharyngeal residue - posterior pharnyx;Delayed swallow initiation;Penetration/Aspiration before swallow;Penetration/Aspiration during swallow;Moderate aspiration (loosened cervical collar attempted chin tuck, no effect) Penetration/Aspiration details (thin cup): Material enters airway, passes BELOW cords without attempt by patient to eject out (silent aspiration) Pharyngeal - Solids Pharyngeal - Puree: Reduced pharyngeal peristalsis;Reduced epiglottic inversion;Reduced tongue base retraction;Pharyngeal residue - valleculae;Pharyngeal residue - posterior pharnyx;Delayed swallow initiation Penetration/Aspiration details (puree): Material does not enter airway Pharyngeal - Mechanical Soft: Reduced pharyngeal peristalsis;Reduced epiglottic inversion;Reduced tongue base retraction;Pharyngeal residue - valleculae;Pharyngeal residue - posterior pharnyx;Delayed swallow initiation Pharyngeal - Pill: Reduced pharyngeal peristalsis;Reduced epiglottic inversion;Reduced tongue base retraction;Pharyngeal residue - valleculae;Delayed swallow initiation (mulitple hard swallows, liquid wash to clear pill in vallecu)  Cervical  Esophageal Phase    GO    Cervical Esophageal Phase Cervical Esophageal Phase: Impaired Cervical Esophageal Phase - Comment Cervical Esophageal Comment: Restricted opening of UES/prominent CP. Probable edema of posterior pharyngeal wall    Functional Assessment Tool Used: clinical judgement Functional Limitations: Swallowing Swallow Current Status ([id number]): At least 40 percent but less than 60 percent impaired, limited or restricted Swallow Goal Status [phone](G8997): At least 40 percent but less than 60 percent impaired, limited or restricted Swallow Discharge Status [phone](G8998): At least 40 percent but less than 60 percent impaired, limited or restricted   [facility] Surgical Center LLCBonnie Iysis Germain, MA CCC-SLP [phone]  [name], Tiahna Cure Caroline 08/11/2014, 1:57 PM

## 2014-08-13 ENCOUNTER — Telehealth: Payer: Self-pay | Admitting: Pharmacist Clinician (PhC)/ Clinical Pharmacy Specialist

## 2014-08-13 ENCOUNTER — Ambulatory Visit: Payer: Self-pay | Admitting: Pharmacist Clinician (PhC)/ Clinical Pharmacy Specialist

## 2014-08-13 DIAGNOSIS — Z7901 Long term (current) use of anticoagulants: Secondary | ICD-10-CM

## 2014-08-13 DIAGNOSIS — I482 Chronic atrial fibrillation, unspecified: Secondary | ICD-10-CM

## 2014-08-13 NOTE — Telephone Encounter (Signed)
[facility], per hospital note C4 fracture.  Was sent to SNF, family not sure how to get INR checks  Per hospital d/c note, pt is no longer on warfarin.  Told family to call when pt released and we can reschedule, should he still be on warfarin

## 2014-08-18 ENCOUNTER — Ambulatory Visit: Payer: Commercial Managed Care - HMO | Admitting: Pharmacist Clinician (PhC)/ Clinical Pharmacy Specialist

## 2014-08-20 ENCOUNTER — Telehealth: Payer: Self-pay | Admitting: Cardiology

## 2014-08-20 NOTE — Telephone Encounter (Signed)
spoke to daughter  Information given.verbalized understanding. Information also given to Kristin-PHARM-D

## 2014-08-20 NOTE — Telephone Encounter (Signed)
I would not restart warfarin until okay by neurosurgery.  dh

## 2014-08-20 NOTE — Telephone Encounter (Signed)
He does not necessarily need to see me. I think until he is stable after his fall, it is fine to continue to hold his anticoagulation. Only if there is an active cardiac issue does need to seen urgently. I will defer to his primary physician as to when they feel it safe or if it is safe to restart warfarin.

## 2014-08-20 NOTE — Telephone Encounter (Signed)
Kathie RhodesBetty states that the patient had a fall and was taken off Coumadin.  He was in rehab after his discharge from the hospital and he has been off his Coumadin for a little over 2 weeks.  She has two questions, does he need to be on some type of blood thinner and does he need to see Dr. Herbie BaltimoreHarding for a follow up visit?

## 2014-08-20 NOTE — Telephone Encounter (Signed)
Daughter called back.  she states no one at the hospital instructed the patient if to restart warfarin.  she states the Neurosurgeon -Dr Venetia MaxonStern wanted him to stop during hospitalization Due to his fall -TORE LIGAMENT IN NECK.  Patient is now in a hard collar permanently. Family opted for brace instead of surgery. appointment with neurosurgeon in FEB 2016.

## 2014-08-20 NOTE — Telephone Encounter (Signed)
Left message on voicemail.  will have to defer to Dr Herbie BaltimoreHarding. Reviewing patient's CHL INFORMATION- PATIENT WAS DISCHARGE FROM REHAB 08/06/14 Does patient need appointment with Dr Herbie BaltimoreHarding?

## 2014-10-22 ENCOUNTER — Ambulatory Visit: Payer: Self-pay | Admitting: Internal Medicine

## 2015-02-10 ENCOUNTER — Ambulatory Visit: Payer: 59 | Admitting: Family Medicine

## 2015-02-24 ENCOUNTER — Telehealth: Payer: Self-pay | Admitting: Family Medicine

## 2015-02-24 ENCOUNTER — Ambulatory Visit (INDEPENDENT_AMBULATORY_CARE_PROVIDER_SITE_OTHER): Payer: PPO | Admitting: Family Medicine

## 2015-02-24 ENCOUNTER — Encounter: Payer: Self-pay | Admitting: Family Medicine

## 2015-02-24 VITALS — BP 122/64 | HR 76 | Temp 97.2°F | Ht 65.5 in | Wt 149.5 lb

## 2015-02-24 DIAGNOSIS — I2510 Atherosclerotic heart disease of native coronary artery without angina pectoris: Secondary | ICD-10-CM | POA: Diagnosis not present

## 2015-02-24 DIAGNOSIS — I10 Essential (primary) hypertension: Secondary | ICD-10-CM

## 2015-02-24 DIAGNOSIS — R634 Abnormal weight loss: Secondary | ICD-10-CM | POA: Diagnosis not present

## 2015-02-24 DIAGNOSIS — S12300A Unspecified displaced fracture of fourth cervical vertebra, initial encounter for closed fracture: Secondary | ICD-10-CM | POA: Diagnosis not present

## 2015-02-24 DIAGNOSIS — I482 Chronic atrial fibrillation, unspecified: Secondary | ICD-10-CM

## 2015-02-24 DIAGNOSIS — E039 Hypothyroidism, unspecified: Secondary | ICD-10-CM

## 2015-02-24 DIAGNOSIS — R351 Nocturia: Secondary | ICD-10-CM

## 2015-02-24 DIAGNOSIS — E785 Hyperlipidemia, unspecified: Secondary | ICD-10-CM

## 2015-02-24 DIAGNOSIS — N401 Enlarged prostate with lower urinary tract symptoms: Secondary | ICD-10-CM | POA: Insufficient documentation

## 2015-02-24 DIAGNOSIS — Z7901 Long term (current) use of anticoagulants: Secondary | ICD-10-CM

## 2015-02-24 MED ORDER — LEVOTHYROXINE SODIUM 100 MCG PO TABS: 100.0000 ug | ORAL_TABLET | Freq: Every morning | ORAL | Status: DC

## 2015-02-24 NOTE — Assessment & Plan Note (Signed)
Anticipate BPH related - check DRE next wellness visit.

## 2015-02-24 NOTE — Assessment & Plan Note (Signed)
Chronic, stable. Continue current regimen. 

## 2015-02-24 NOTE — Telephone Encounter (Signed)
plz notify - as we're off coumadin, I'd like him to start aspirin  enteric coated once daily. Added to med list.

## 2015-02-24 NOTE — Progress Notes (Signed)
Pre visit review using our clinic review tool, if applicable. No additional management support is needed unless otherwise documented below in the visit note. 

## 2015-02-24 NOTE — Assessment & Plan Note (Signed)
Off coumadin. CHADSVASC2 = 4 (4.8% chance stroke/yr) HASBLED2 = 2 (4.1% chance). Could consider restarting anticoagulation although NSG rec against this. Will have him touch base with cards.

## 2015-02-24 NOTE — Assessment & Plan Note (Signed)
10lb weight loss. Decreased appetite noted. rec boost daily.

## 2015-02-24 NOTE — Assessment & Plan Note (Signed)
Continue atorvastatin, check levels next fasting labwork.

## 2015-02-24 NOTE — Assessment & Plan Note (Signed)
Chronic, stable. Discussed optimal levothyroxine administration

## 2015-02-24 NOTE — Assessment & Plan Note (Signed)
Stable, asxs. Continue beta blocker, ace inhibitor, statin. Off anticoagulant per neurosurgery recommendations so will start aspirin  daily.

## 2015-02-24 NOTE — Patient Instructions (Addendum)
Gusto conocerlos hoy.  Empieze a tomar 1 boost al dia. Regresar en 2-3 meses para medicare wellness visit, en ayunas un par de dias antes venir para examen de laboratorio.

## 2015-02-24 NOTE — Progress Notes (Signed)
BP 122/64 mmHg  Pulse 76  Temp(Src) 97.2 F (36.2 C) (Tympanic)  Ht 5' 5.5" (1.664 m)  Wt 149 lb 8 oz (67.813 kg)  BMI 24.49 kg/m2   CC: new pt to establish care  Subjective:    Patient ID: Jacob [name], male    DOB: 1917-08-20, 81 y.o.   MRN: [id number]  HPI: Jacob [name] is a 81 y.o. male presenting on 02/24/2015 for Establish Care   Prior saw Dr Clarene Duke. Spanish speaking only. Here with granddaughter.   H/o CAD s/p 4v CABG 1994, chronic atrial fibrillation off coumadin, hypertension glaucoma and hyperlipidemia.   Had a fall 07/2014 - s/p C4 neck fracture followed by neurosurgery. Complicated by perivertebral hematoma, off anticoagulant since then.   Glaucoma - followed by Dr Inez Pilgrim. On 2 eye drops.  Weakening of the stream noted. Also nocturia.   Preventative: Due for this.  Relevant past medical, surgical, family and social history reviewed and updated as indicated. Interim medical history since our last visit reviewed. Allergies and medications reviewed and updated. Current Outpatient Prescriptions on File Prior to Visit  Medication Sig  . carvedilol (COREG) 3.125 MG tablet Take 1 tablet (3.125 mg total) by mouth 2 (two) times daily.  Marland Kitchen latanoprost (XALATAN) 0.005 % ophthalmic solution Place 1 drop into both eyes at bedtime.  Marland Kitchen lisinopril (PRINIVIL,ZESTRIL) 20 MG tablet Take 20 mg by mouth every morning.  . Nutritional Supplements (BOOST HIGH PROTEIN PO) Take 1 Bottle by mouth daily as needed (Dietary supplement).  . simvastatin (ZOCOR) 20 MG tablet Take 1 tablet (20 mg total) by mouth at bedtime.  . timolol (TIMOPTIC) 0.5 % ophthalmic solution Place 1 drop into both eyes every morning.    No current facility-administered medications on file prior to visit.    Review of Systems Per HPI unless specifically indicated above     Objective:    BP 122/64 mmHg  Pulse 76  Temp(Src) 97.2 F (36.2 C) (Tympanic)  Ht 5' 5.5" (1.664 m)  Wt 149 lb 8 oz (67.813  kg)  BMI 24.49 kg/m2  Wt Readings from Last 3 Encounters:  02/24/15 149 lb 8 oz (67.813 kg)  08/03/14 159 lb 2.8 oz (72.2 kg)  06/30/14 163 lb 12.8 oz (74.299 kg)    Physical Exam  Constitutional: He appears well-developed and well-nourished. No distress.  HENT:  Head: Normocephalic and atraumatic.  Mouth/Throat: Oropharynx is clear and moist. No oropharyngeal exudate.  Dentures HOH  Eyes: Conjunctivae and EOM are normal. Pupils are equal, round, and reactive to light. No scleral icterus.  Neck: Normal range of motion. Neck supple. No thyromegaly present.  Cardiovascular: Normal rate, normal heart sounds and intact distal pulses.  An irregularly irregular rhythm present.  No murmur heard. Pulmonary/Chest: Effort normal and breath sounds normal. No respiratory distress. He has no wheezes. He has no rales.  coarse  Musculoskeletal: He exhibits no edema.  Lymphadenopathy:    He has no cervical adenopathy.  Skin: Skin is warm and dry. No rash noted.  Psychiatric: He has a normal mood and affect.  Nursing note and vitals reviewed.      Assessment & Plan:   Problem List Items Addressed This Visit      Chronic   Chronic atrial fibrillation - Primary (Chronic)    Off coumadin. CHADSVASC2 = 4 (4.8% chance stroke/yr) HASBLED2 = 2 (4.1% chance). Could consider restarting anticoagulation although NSG rec against this. Will have him touch base with cards.  Other   RESOLVED: C4 cervical fracture    Resolved.       CAD in native artery -->  CABG x4 in 1994 (Chronic)    Stable, asxs. Continue beta blocker, ace inhibitor, statin. Off anticoagulant per neurosurgery recommendations so will start aspirin 81mg  daily.      Dyslipidemia, goal LDL below 70 - on statin, followed by primary physician. (Chronic)    Continue atorvastatin, check levels next fasting labwork.      Essential hypertension (Chronic)    Chronic, stable. Continue current regimen.      Hypothyroidism     Chronic, stable. Discussed optimal levothyroxine administration      Relevant Medications   levothyroxine (SYNTHROID, LEVOTHROID) 100 MCG tablet   Long term current use of anticoagulant therapy (Chronic)    Off AC per Dr Venetia MaxonStern      Loss of weight    10lb weight loss. Decreased appetite noted. rec boost daily.       Nocturia    Anticipate BPH related - check DRE next wellness visit.          Follow up plan: Return in about 2 months (around 04/26/2015), or as needed, for medicare wellness.

## 2015-02-24 NOTE — Assessment & Plan Note (Signed)
Resolved

## 2015-02-24 NOTE — Assessment & Plan Note (Signed)
Off AC per Dr Venetia MaxonStern

## 2015-02-25 ENCOUNTER — Telehealth: Payer: Self-pay | Admitting: Family Medicine

## 2015-02-25 ENCOUNTER — Encounter: Payer: Self-pay | Admitting: *Deleted

## 2015-02-25 NOTE — Telephone Encounter (Signed)
Kathie RhodesBetty returned call. Note faxed to [phone] as requested.

## 2015-02-25 NOTE — Telephone Encounter (Signed)
Daughter came with dad to his appt yesterday and needs a note for work.  She is requesting that you fax it to [phone] attn Claiborne Riggancy Swason cb number [phone]  Thanks

## 2015-02-25 NOTE — Telephone Encounter (Signed)
Message left notifying patient's grand-daughter as patient only speaks spanish.

## 2015-02-25 NOTE — Telephone Encounter (Signed)
Left message for Jacob [name] to return call and give fax number again. The number in message is incomplete.

## 2015-03-03 ENCOUNTER — Encounter: Payer: Self-pay | Admitting: Family Medicine

## 2015-03-05 ENCOUNTER — Encounter: Payer: Self-pay | Admitting: *Deleted

## 2015-06-26 ENCOUNTER — Telehealth: Payer: Self-pay | Admitting: *Deleted

## 2015-06-26 NOTE — Telephone Encounter (Signed)
RECORDED IMMUNIZATION - FLU VACCINE

## 2015-07-23 ENCOUNTER — Other Ambulatory Visit: Payer: Self-pay | Admitting: Cardiology

## 2015-07-26 ENCOUNTER — Other Ambulatory Visit: Payer: Self-pay | Admitting: Cardiology

## 2015-07-27 NOTE — Telephone Encounter (Signed)
REFILL 

## 2015-08-26 ENCOUNTER — Other Ambulatory Visit: Payer: Self-pay | Admitting: Cardiology

## 2015-08-26 ENCOUNTER — Telehealth: Payer: Self-pay

## 2015-08-26 NOTE — Telephone Encounter (Signed)
Jacob [name] pts daughter(DPR signed) left v/m wanting to know if pt needs labs prior to next appt. Medicare wellness exam supposed to be scheduled in 03/2015. Pt did not make appt. AVS in spanish; Please advise.

## 2015-08-26 NOTE — Telephone Encounter (Signed)
REFILL 

## 2015-08-26 NOTE — Telephone Encounter (Signed)
Yes plz schedule labs prior to next appt.

## 2015-08-27 NOTE — Telephone Encounter (Signed)
Could one of you please schedule this? Thank you!

## 2015-08-27 NOTE — Telephone Encounter (Signed)
Spoke with daughter Kathie RhodesBetty, she will call back when she gets a break from work to schedule [facility] [facility] with labs prior.

## 2015-09-23 ENCOUNTER — Observation Stay
Admission: EM | Admit: 2015-09-23 | Discharge: 2015-09-24 | Disposition: A | Discharge status: Home / Self Care | Payer: PPO | Attending: Internal Medicine | Admitting: Internal Medicine

## 2015-09-23 ENCOUNTER — Emergency Department: Payer: PPO

## 2015-09-23 ENCOUNTER — Encounter: Payer: Self-pay | Admitting: Emergency Medicine

## 2015-09-23 DIAGNOSIS — Z7902 Long term (current) use of antithrombotics/antiplatelets: Secondary | ICD-10-CM | POA: Diagnosis not present

## 2015-09-23 DIAGNOSIS — R531 Weakness: Secondary | ICD-10-CM | POA: Diagnosis not present

## 2015-09-23 DIAGNOSIS — R200 Anesthesia of skin: Secondary | ICD-10-CM | POA: Diagnosis not present

## 2015-09-23 DIAGNOSIS — E0580 Other thyrotoxicosis without thyrotoxic crisis or storm: Secondary | ICD-10-CM | POA: Insufficient documentation

## 2015-09-23 DIAGNOSIS — Z951 Presence of aortocoronary bypass graft: Secondary | ICD-10-CM | POA: Insufficient documentation

## 2015-09-23 DIAGNOSIS — Z87442 Personal history of urinary calculi: Secondary | ICD-10-CM | POA: Diagnosis not present

## 2015-09-23 DIAGNOSIS — E785 Hyperlipidemia, unspecified: Secondary | ICD-10-CM | POA: Insufficient documentation

## 2015-09-23 DIAGNOSIS — I6523 Occlusion and stenosis of bilateral carotid arteries: Secondary | ICD-10-CM | POA: Diagnosis not present

## 2015-09-23 DIAGNOSIS — I10 Essential (primary) hypertension: Secondary | ICD-10-CM | POA: Insufficient documentation

## 2015-09-23 DIAGNOSIS — R29898 Other symptoms and signs involving the musculoskeletal system: Secondary | ICD-10-CM

## 2015-09-23 DIAGNOSIS — Z7901 Long term (current) use of anticoagulants: Secondary | ICD-10-CM | POA: Diagnosis not present

## 2015-09-23 DIAGNOSIS — I63511 Cerebral infarction due to unspecified occlusion or stenosis of right middle cerebral artery: Secondary | ICD-10-CM

## 2015-09-23 DIAGNOSIS — I2510 Atherosclerotic heart disease of native coronary artery without angina pectoris: Secondary | ICD-10-CM | POA: Insufficient documentation

## 2015-09-23 DIAGNOSIS — I482 Chronic atrial fibrillation: Secondary | ICD-10-CM | POA: Diagnosis not present

## 2015-09-23 DIAGNOSIS — Z09 Encounter for follow-up examination after completed treatment for conditions other than malignant neoplasm: Secondary | ICD-10-CM | POA: Diagnosis not present

## 2015-09-23 DIAGNOSIS — G459 Transient cerebral ischemic attack, unspecified: Secondary | ICD-10-CM | POA: Diagnosis not present

## 2015-09-23 DIAGNOSIS — I739 Peripheral vascular disease, unspecified: Secondary | ICD-10-CM | POA: Insufficient documentation

## 2015-09-23 DIAGNOSIS — Z7982 Long term (current) use of aspirin: Secondary | ICD-10-CM | POA: Diagnosis not present

## 2015-09-23 DIAGNOSIS — Z8673 Personal history of transient ischemic attack (TIA), and cerebral infarction without residual deficits: Secondary | ICD-10-CM | POA: Insufficient documentation

## 2015-09-23 DIAGNOSIS — D649 Anemia, unspecified: Secondary | ICD-10-CM | POA: Diagnosis not present

## 2015-09-23 DIAGNOSIS — M6289 Other specified disorders of muscle: Secondary | ICD-10-CM | POA: Diagnosis not present

## 2015-09-23 DIAGNOSIS — I25810 Atherosclerosis of coronary artery bypass graft(s) without angina pectoris: Secondary | ICD-10-CM | POA: Diagnosis not present

## 2015-09-23 HISTORY — DX: Cerebral infarction due to unspecified occlusion or stenosis of right middle cerebral artery: I63.511

## 2015-09-23 LAB — DIFFERENTIAL
BASOS ABS: 0 10*3/uL (ref 0–0.1)
BASOS PCT: 1 %
Eosinophils Absolute: 0.3 10*3/uL (ref 0–0.7)
Eosinophils Relative: 5 %
LYMPHS PCT: 23 %
Lymphs Abs: 1.4 10*3/uL (ref 1.0–3.6)
MONO ABS: 0.6 10*3/uL (ref 0.2–1.0)
Monocytes Relative: 10 %
NEUTROS ABS: 3.7 10*3/uL (ref 1.4–6.5)
NEUTROS PCT: 61 %

## 2015-09-23 LAB — COMPREHENSIVE METABOLIC PANEL
ALBUMIN: 3.8 g/dL (ref 3.5–5.0)
ALK PHOS: 87 U/L (ref 38–126)
ALT: 17 U/L (ref 17–63)
AST: 22 U/L (ref 15–41)
Anion gap: 5 (ref 5–15)
BUN: 29 mg/dL — AB (ref 6–20)
CALCIUM: 9 mg/dL (ref 8.9–10.3)
CHLORIDE: 108 mmol/L (ref 101–111)
CO2: 26 mmol/L (ref 22–32)
CREATININE: 1.12 mg/dL (ref 0.61–1.24)
GFR calc Af Amer: >60 mL/min (ref >60)
GFR calc non Af Amer: 53 mL/min — ABNORMAL LOW (ref >60)
Glucose, Bld: 94 mg/dL (ref 65–99)
Potassium: 4.4 mmol/L (ref 3.5–5.1)
SODIUM: 139 mmol/L (ref 135–145)
Total Bilirubin: 0.6 mg/dL (ref 0.3–1.2)
Total Protein: 7.8 g/dL (ref 6.5–8.1)

## 2015-09-23 LAB — PROTIME-INR
INR: 1.01
PROTHROMBIN TIME: 13.5 s (ref 11.4–15.0)

## 2015-09-23 LAB — CBC
HCT: 32.4 % — ABNORMAL LOW (ref 40.0–52.0)
Hemoglobin: 10.9 g/dL — ABNORMAL LOW (ref 13.0–18.0)
MCH: 31.9 pg (ref 26.0–34.0)
MCHC: 33.6 g/dL (ref 32.0–36.0)
MCV: 95 fL (ref 80.0–100.0)
PLATELETS: 228 10*3/uL (ref 150–440)
RBC: 3.41 MIL/uL — AB (ref 4.40–5.90)
RDW: 13.7 % (ref 11.5–14.5)
WBC: 6.1 10*3/uL (ref 3.8–10.6)

## 2015-09-23 LAB — TROPONIN I: Troponin I: <0.03 ng/mL (ref <0.031)

## 2015-09-23 LAB — APTT: APTT: 28 s (ref 24–36)

## 2015-09-23 NOTE — ED Notes (Addendum)
Pt wheeled to room in wheel chair. Per daughter, pt had L arm numbness this AM. Around lunch, it improved. Pt went for walk like he normally does and coming back from walk, he was walking up stairs to house grabbing railing and felt L arm numb again, from elbow to fingers. Pt told daughter he denied any L leg weakness, denied facial drooping and denied slurred speech. No history of these symptoms. Daughter states hx of slurred speech after fall with broken bones in neck and swollen face in December 2015. Pt denies tingling and numbness at this time.

## 2015-09-23 NOTE — ED Notes (Signed)
Dr. Derrill Kay at bedside to answer family question.

## 2015-09-23 NOTE — ED Notes (Signed)
PA student at bedside.

## 2015-09-23 NOTE — ED Notes (Signed)
Dr. Goodman at bedside.  

## 2015-09-23 NOTE — ED Notes (Signed)
Pt ambulatory to toilet with steady gait.  

## 2015-09-23 NOTE — ED Provider Notes (Signed)
[facility] Emergency Department Provider Note    ____________________________________________  Time seen: 2240  I have reviewed the triage vital signs and the nursing notes.   HISTORY  Chief Complaint Numbness   History limited by: Not Limited   HPI Jacob [name] is a 82 y.o. male with a history of atrial fibrillation, not on anticoagulation, who presents to the emergency department today because of a period of left arm tingling and numbness and weakness. The patient states that the left arm numbness started this morning after waking up. Initially the patient was concerned that he might have slept on it wrong. He then went on his typical morning walk however his wife noticed that when he was trying to get back up the stairs he had weakness with his left arm and was unable to use his left arm up the railings. This lasted for perhaps slightly longer than an hour. The patient did not have any associated headache, slurred speech or lower extremity weakness. He did not have any associated chest pain. The symptoms have since resolved. He now denies any numbness or weakness in that left arm.     Past Medical History  Diagnosis Date  . CAD in native artery -->  1994    referred for CABG  . S/P CABG x 4 1994    a) 1994: LIMA-LAD, SVG-RI, SVG-OM, SVG-RCA; b) 09/2006, Persantine Myoview: fixed inferior defect consistent with infarct/scar without peri-infarct ischemia;; 2-D echo: Mild-mod concentric LVH. Normal EF.  Abnormal relaxation, aortic sclerosis with mild-mod AI  . Chronic atrial fibrillation (HCC) 12/15/2012  . Long term (current) use of anticoagulants 12/15/2012    warfarin  . Essential hypertension 05/23/2013  . Dyslipidemia, goal LDL below 70[272.4]      - on statin, followed by primary physician.   . Glaucoma   . History of kidney stones   . H/O seasonal allergies   . Diverticulitis   . C4 cervical fracture (HCC) 2015    Patient Active Problem List    Diagnosis Date Noted  . Loss of weight 02/24/2015  . Nocturia 02/24/2015  . Hypothyroidism 08/02/2014  . Dysphagia 08/02/2014  . Essential hypertension 05/23/2013  . CAD in native artery -->  CABG x4 in 1994   . 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 Dyslipidemia, goal LDL below 70 - on statin, followed by primary physician.   . Chronic atrial fibrillation (HCC) 12/15/2012    Class: Chronic  . Long term current use of anticoagulant therapy 12/15/2012    Past Surgical History  Procedure Laterality Date  . Coronary artery bypass graft  1994    LIMA-LAD, SVG-RI, SVG-OM, SVG-RCA   . Persantine myoview  February 2008    Fixed inferior wall defect-consistent with infarct/scar without peri-infarct ischemia  . Transthoracic echocardiogram  February 2008    Mild/mod conc LVH, Nl Size & Fxn, Gr 1 DD(abnormal relaxation), mild to moderate aortic regurgitation and mildly sclerotic aortic valve.  Marland Kitchen Appendectomy    . Hand surgery Left 1985  . Femur surgery Left 2000    replacement- due to fall    Current Outpatient Rx  Name  Route  Sig  Dispense  Refill  . aspirin EC 81 MG tablet   Oral   Take 81 mg by mouth daily.         . carvedilol (COREG) 3.125 MG tablet   Oral   Take 1 tablet (3.125  mg total) by mouth 2 (two) times daily with a meal.   60 tablet   2   . latanoprost (XALATAN) 0.005 % ophthalmic solution   Both Eyes   Place 1 drop into both eyes at bedtime.         Marland Kitchen levothyroxine (SYNTHROID, LEVOTHROID) 100 MCG tablet   Oral   Take 1 tablet (100 mcg total) by mouth every morning.   90 tablet   3   . lisinopril (PRINIVIL,ZESTRIL) 20 MG tablet   Oral   Take 1 tablet (20 mg total) by mouth daily. KEEP OV.   30 tablet   2   . Nutritional Supplements (BOOST HIGH PROTEIN PO)   Oral   Take 1 Bottle by mouth daily as needed (Dietary supplement).         . simvastatin (ZOCOR) 20 MG tablet   Oral   Take 1 tablet (20 mg  total) by mouth daily at 6 PM. KEEP OV.   30 tablet   2   . timolol (TIMOPTIC) 0.5 % ophthalmic solution   Both Eyes   Place 1 drop into both eyes every morning.            Allergies Review of patient's allergies indicates no known allergies.  Family History  Problem Relation Age of Onset  . Liver cancer Mother   . Prostate cancer Father     Social History Social History  Substance Use Topics  . Smoking status: Never Smoker   . Smokeless tobacco: Never Used  . Alcohol Use: No    Review of Systems  Constitutional: Negative for fever. Cardiovascular: Negative for chest pain. Respiratory: Negative for shortness of breath. Gastrointestinal: Negative for abdominal pain, vomiting and diarrhea. Neurological: Positive for left arm weakness and numbness.   10-point ROS otherwise negative.  ____________________________________________   PHYSICAL EXAM:  VITAL SIGNS: ED Triage Vitals  Enc Vitals Group     BP 09/23/15 2100 161/91 mmHg     Pulse Rate 09/23/15 2100 74     Resp 09/23/15 2100 16     Temp 09/23/15 2329 97.9 F (36.6 C)     Temp src --      SpO2 09/23/15 2100 100 %     Weight --      Height --      Head Cir --      Peak Flow --      Pain Score 09/23/15 1852 0   Constitutional: Alert and oriented. Well appearing and in no distress. Eyes: Conjunctivae are normal. Right pupil dilated, family states this is baseline after surgeries. Normal extraocular movements. ENT   Head: Normocephalic and atraumatic.   Nose: No congestion/rhinnorhea.   Mouth/Throat: Mucous membranes are moist.   Neck: No stridor. Hematological/Lymphatic/Immunilogical: No cervical lymphadenopathy. Cardiovascular: Normal rate, regular rhythm.  No murmurs, rubs, or gallops. Respiratory: Normal respiratory effort without tachypnea nor retractions. Breath sounds are clear and equal bilaterally. No wheezes/rales/rhonchi. Gastrointestinal: Soft and nontender. No distention.  There is no CVA tenderness. Genitourinary: Deferred Musculoskeletal: Normal range of motion in all extremities. No joint effusions.  No lower extremity tenderness nor edema. Neurologic:  Normal speech and language. Face symmetric. Tongue midline. Extraocular motions intact. Strength 5 out of 5 in upper and lower extremities. No drift. Sensation intact. No gross focal neurologic deficits are appreciated. NIH SS of 03 Skin:  Skin is warm, dry and intact. No rash noted. Psychiatric: Mood and affect are normal. Speech and behavior are normal. Patient exhibits appropriate  insight and judgment.  ____________________________________________    LABS (pertinent positives/negatives)  Labs Reviewed  CBC - Abnormal; Notable for the following:    RBC 3.41 (*)    Hemoglobin 10.9 (*)    HCT 32.4 (*)    All other components within normal limits  COMPREHENSIVE METABOLIC PANEL - Abnormal; Notable for the following:    BUN 29 (*)    GFR calc non Af Amer 53 (*)    All other components within normal limits  PROTIME-INR  APTT  DIFFERENTIAL  TROPONIN I  CBG MONITORING, ED  I-STAT CHEM 8, ED     ____________________________________________   EKG  I, Phineas Semen, attending physician, personally viewed and interpreted this EKG  EKG Time: 1906 Rate: 78 Rhythm: atrial fibrillation Axis: left axis deviation Intervals: qtc 474 QRS: narrow ST changes: no st elevation Impression: abnormal ekg  ____________________________________________    RADIOLOGY  CT head IMPRESSION: 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 No acute infarct. No hemorrhage or mass effect. Bones diffusely osteoporotic.  ____________________________________________   PROCEDURES  Procedure(s) performed: None  Critical Care performed: No  ____________________________________________   INITIAL IMPRESSION / ASSESSMENT AND PLAN / ED  COURSE  Pertinent labs & imaging results that were available during my care of the patient were reviewed by me and considered in my medical decision making (see chart for details).  Patient presented to the emergency department today after an episode of left arm numbness and weakness. On physical exam the symptoms had all resolved and there were no focal neuro deficits. I do have concern however for TIA especially given history of atrial fibrillation. Patient had not been anticoagulated because of a fall in 2015. Will plan on admission to the hospitalist service for further workup and evaluation.  ____________________________________________   FINAL CLINICAL IMPRESSION(S) / ED DIAGNOSES  Final diagnoses:  Left-sided weakness     Phineas Semen, MD 09/24/15 0006

## 2015-09-24 ENCOUNTER — Observation Stay: Payer: PPO

## 2015-09-24 ENCOUNTER — Observation Stay (HOSPITAL_BASED_OUTPATIENT_CLINIC_OR_DEPARTMENT_OTHER)
Admit: 2015-09-24 | Discharge: 2015-09-24 | Disposition: A | Discharge status: Home / Self Care | Payer: PPO | Attending: Internal Medicine | Admitting: Internal Medicine

## 2015-09-24 DIAGNOSIS — E0580 Other thyrotoxicosis without thyrotoxic crisis or storm: Secondary | ICD-10-CM | POA: Diagnosis not present

## 2015-09-24 DIAGNOSIS — D649 Anemia, unspecified: Secondary | ICD-10-CM

## 2015-09-24 DIAGNOSIS — I63032 Cerebral infarction due to thrombosis of left carotid artery: Secondary | ICD-10-CM | POA: Diagnosis not present

## 2015-09-24 DIAGNOSIS — G459 Transient cerebral ischemic attack, unspecified: Secondary | ICD-10-CM | POA: Diagnosis not present

## 2015-09-24 DIAGNOSIS — I639 Cerebral infarction, unspecified: Secondary | ICD-10-CM | POA: Diagnosis not present

## 2015-09-24 DIAGNOSIS — I63031 Cerebral infarction due to thrombosis of right carotid artery: Secondary | ICD-10-CM | POA: Diagnosis not present

## 2015-09-24 DIAGNOSIS — R29898 Other symptoms and signs involving the musculoskeletal system: Secondary | ICD-10-CM

## 2015-09-24 DIAGNOSIS — I482 Chronic atrial fibrillation: Secondary | ICD-10-CM | POA: Diagnosis not present

## 2015-09-24 DIAGNOSIS — I63411 Cerebral infarction due to embolism of right middle cerebral artery: Secondary | ICD-10-CM | POA: Diagnosis not present

## 2015-09-24 DIAGNOSIS — I10 Essential (primary) hypertension: Secondary | ICD-10-CM | POA: Diagnosis not present

## 2015-09-24 LAB — LIPID PANEL
Cholesterol: 122 mg/dL (ref 0–200)
HDL: 44 mg/dL (ref >40)
LDL CALC: 49 mg/dL (ref 0–99)
Total CHOL/HDL Ratio: 2.8 RATIO
Triglycerides: 147 mg/dL (ref <150)
VLDL: 29 mg/dL (ref 0–40)

## 2015-09-24 LAB — TSH: TSH: 0.143 u[IU]/mL — ABNORMAL LOW (ref 0.350–4.500)

## 2015-09-24 LAB — T4, FREE: Free T4: 1.21 ng/dL — ABNORMAL HIGH (ref 0.61–1.12)

## 2015-09-24 MED ORDER — CARVEDILOL 3.125 MG PO TABS
3.1250 mg | ORAL_TABLET | Freq: Two times a day (BID) | ORAL | Status: DC
  Administered 2015-09-24: 3.125 mg via ORAL
  Filled 2015-09-24: qty 1

## 2015-09-24 MED ORDER — LEVOTHYROXINE SODIUM 100 MCG PO TABS
100.0000 ug | ORAL_TABLET | Freq: Every morning | ORAL | Status: DC
  Administered 2015-09-24: 100 ug via ORAL
  Filled 2015-09-24: qty 1

## 2015-09-24 MED ORDER — ASPIRIN EC 81 MG PO TBEC
81.0000 mg | DELAYED_RELEASE_TABLET | Freq: Every day | ORAL | Status: DC
  Administered 2015-09-24: 81 mg via ORAL
  Filled 2015-09-24: qty 1

## 2015-09-24 MED ORDER — LATANOPROST 0.005 % OP SOLN
1.0000 [drp] | Freq: Every day | OPHTHALMIC | Status: DC
  Filled 2015-09-24: qty 2.5

## 2015-09-24 MED ORDER — TIMOLOL MALEATE 0.5 % OP SOLN
1.0000 [drp] | Freq: Every morning | OPHTHALMIC | Status: DC
  Administered 2015-09-24: 1 [drp] via OPHTHALMIC
  Filled 2015-09-24: qty 5

## 2015-09-24 MED ORDER — MORPHINE SULFATE (PF) 2 MG/ML IV SOLN
1.0000 mg | INTRAVENOUS | Status: DC | PRN
Start: 2015-09-24 — End: 2015-09-24

## 2015-09-24 MED ORDER — LISINOPRIL 20 MG PO TABS
20.0000 mg | ORAL_TABLET | Freq: Every day | ORAL | Status: DC
  Administered 2015-09-24: 20 mg via ORAL
  Filled 2015-09-24: qty 1

## 2015-09-24 MED ORDER — ATORVASTATIN CALCIUM 40 MG PO TABS: 40.0000 mg | ORAL_TABLET | Freq: Every day | ORAL | Status: DC

## 2015-09-24 MED ORDER — LEVOTHYROXINE SODIUM 75 MCG PO TABS: 75.0000 ug | ORAL_TABLET | Freq: Every day | ORAL | Status: DC

## 2015-09-24 MED ORDER — ACETAMINOPHEN 650 MG RE SUPP
650.0000 mg | Freq: Four times a day (QID) | RECTAL | Status: DC | PRN
Start: 2015-09-24 — End: 2015-09-24

## 2015-09-24 MED ORDER — ATORVASTATIN CALCIUM 20 MG PO TABS: 40.0000 mg | ORAL_TABLET | Freq: Every day | ORAL | Status: DC

## 2015-09-24 MED ORDER — SODIUM CHLORIDE 0.9% FLUSH
3.0000 mL | Freq: Two times a day (BID) | INTRAVENOUS | Status: DC
  Administered 2015-09-24: 10:00:00 3 mL via INTRAVENOUS

## 2015-09-24 MED ORDER — CLOPIDOGREL BISULFATE 75 MG PO TABS
75.0000 mg | ORAL_TABLET | Freq: Every day | ORAL | Status: DC
  Administered 2015-09-24: 75 mg via ORAL
  Filled 2015-09-24: qty 1

## 2015-09-24 MED ORDER — DOCUSATE SODIUM 100 MG PO CAPS
100.0000 mg | ORAL_CAPSULE | Freq: Two times a day (BID) | ORAL | Status: DC
  Administered 2015-09-24 (×2): 100 mg via ORAL
  Filled 2015-09-24 (×2): qty 1

## 2015-09-24 MED ORDER — CLOPIDOGREL BISULFATE 75 MG PO TABS: 75.0000 mg | ORAL_TABLET | Freq: Every day | ORAL | Status: DC

## 2015-09-24 MED ORDER — SIMVASTATIN 20 MG PO TABS: 20.0000 mg | ORAL_TABLET | Freq: Every day | ORAL | Status: DC

## 2015-09-24 MED ORDER — ONDANSETRON HCL 4 MG/2ML IJ SOLN: 4.0000 mg | Freq: Four times a day (QID) | INTRAMUSCULAR | Status: DC | PRN

## 2015-09-24 MED ORDER — HEPARIN SODIUM (PORCINE) 5000 UNIT/ML IJ SOLN
5000.0000 [IU] | Freq: Three times a day (TID) | INTRAMUSCULAR | Status: DC
  Administered 2015-09-24 (×2): 5000 [IU] via SUBCUTANEOUS
  Filled 2015-09-24 (×2): qty 1

## 2015-09-24 MED ORDER — ONDANSETRON HCL 4 MG PO TABS: 4.0000 mg | ORAL_TABLET | Freq: Four times a day (QID) | ORAL | Status: DC | PRN

## 2015-09-24 MED ORDER — ENSURE ENLIVE PO LIQD
237.0000 mL | ORAL | Status: DC
  Administered 2015-09-24: 10:00:00 237 mL via ORAL

## 2015-09-24 MED ORDER — ACETAMINOPHEN 325 MG PO TABS
650.0000 mg | ORAL_TABLET | Freq: Four times a day (QID) | ORAL | Status: DC | PRN
Start: 2015-09-24 — End: 2015-09-24

## 2015-09-24 MED ORDER — BOOST HIGH PROTEIN PO LIQD
1.0000 | Freq: Every day | ORAL | Status: DC | PRN
  Filled 2015-09-24: qty 237

## 2015-09-24 MED ORDER — STROKE: EARLY STAGES OF RECOVERY BOOK
Freq: Once | Status: AC
Start: 2015-09-24 — End: 2015-09-24
  Administered 2015-09-24: 02:00:00

## 2015-09-24 MED ORDER — SODIUM CHLORIDE 0.9 % IV SOLN
INTRAVENOUS | Status: DC
  Administered 2015-09-24: 02:00:00 via INTRAVENOUS

## 2015-09-24 NOTE — Progress Notes (Signed)
Interpreter at bedside. Leory Allinson S, RN  

## 2015-09-24 NOTE — Plan of Care (Signed)
Problem: Education: Goal: Knowledge of [facility] Education information/materials will improve Outcome: Progressing Dtr oriented to unit and translated to pt and pt's wife.  Pt aware how to contact nurse via call light.  Problem: Safety: Goal: Ability to remain free from injury will improve Outcome: Progressing High fall risk with use of bed alarm.  Problem: Physical Regulation: Goal: Ability to maintain clinical measurements within normal limits will improve Outcome: Progressing NIH (0)  Problem: Education: Goal: Knowledge of disease or condition will improve Outcome: Progressing Stroke pamphlet given to dtr

## 2015-09-24 NOTE — Care Management Obs Status (Signed)
MEDICARE OBSERVATION STATUS NOTIFICATION   Patient Details  Name: Jacob [name] MRN: [id number] Date of Birth: 01-Apr-1917   Medicare Observation Status Notification Given:  Yes    Gwenette Greet, RN 09/24/2015, 10:58 AM

## 2015-09-24 NOTE — Discharge Summary (Signed)
[facility] [place] [facility]   PATIENT NAME: Jacob [name]    MR#:  [id number]  DATE OF BIRTH:  [birth year]  DATE OF ADMISSION:  09/23/2015 ADMITTING PHYSICIAN: Arnaldo Natal, MD  DATE OF DISCHARGE: 09/24/2015  4:45 PM  PRIMARY CARE PHYSICIAN: Eustaquio Boyden, MD     ADMISSION DIAGNOSIS:  Left-sided weakness [M62.89]  DISCHARGE DIAGNOSIS:  Principal Problem:   Acute right MCA stroke (HCC) Active Problems:   Left arm weakness   Anemia   SECONDARY DIAGNOSIS:   Past Medical History  Diagnosis Date  . CAD in native artery -->  1994    referred for CABG  . S/P CABG x 4 1994    a) 1994: LIMA-LAD, SVG-RI, SVG-OM, SVG-RCA; b) 09/2006, Persantine Myoview: fixed inferior defect consistent with infarct/scar without peri-infarct ischemia;; 2-D echo: Mild-mod concentric LVH. Normal EF.  Abnormal relaxation, aortic sclerosis with mild-mod AI  . Chronic atrial fibrillation (HCC) 12/15/2012  . Long term (current) use of anticoagulants 12/15/2012    warfarin  . Essential hypertension 05/23/2013  . Dyslipidemia, goal LDL below 70[272.4]      - on statin, followed by primary physician.   . Glaucoma   . History of kidney stones   . H/O seasonal allergies   . Diverticulitis   . C4 cervical fracture (HCC) 2015    .pro HOSPITAL COURSE:  Patient is 82 year old Spanish male with past medical history significant for history of chronic atrial fibrillation, hypertension, hyperlipidemia, anemia, history of coronary artery disease who presents to the hospital with complaints of left arm weakness lasting approximately one hour. He apparently was on Coumadin anticoagulation. However, due to her fall last year He was taken off it and has been managed on aspirin therapy alone. On arrival to the hospital. He was not noted to have any significant neurologic deficit. His initial CT of head revealed atrophy with extensive supratentorial small vessel disease, prior lacunar  type infarcts in left basilar ganglia was noted, but no acute infarct. Patient was admitted to the hospital for further evaluation. MRI of brain was performed which revealed small acute lacunar infarct in the right motor strip posterior right MCA territory, at the area of left upper extremity representation no other acute intracranial abnormality was found. Although small subacute lacunar infarct in the left. Motor area was also noted. Carotid ultrasound revealed mild 1-49% stenosis in proximal right and left internal carotid artery. I discussed case with patient's family extensively and recommended Plavix and different statin, Lipitor at the higher dose,  noting that it felt very likely will not completely protect patient from strokes, all questions from the family were answered.  Discussion by problem #1. Left upper extremity weakness due to small acute lacunar infarct in the right motor strip posterior right MCA territory, continue patient on Plavix, Lipitor, follow-up with primary care physician and neurologist as outpatient for further recommendations #2. Anemia, Hemoccult of stool was ordered, however, not received by the time of discharge, it is recommended to follow patient's hemoglobin level as outpatient closely #3. History of hypothyroidism, now iatrogenic hyperthyroidism with TSH of 0.143 and free T4 1.21, decrease Synthroid dose to 75 g daily, follow-up with primary care physician and check TSH in about 4 weeks #4. Peripheral vascular disease with mild, carotid artery stenosis, continue Plavix and Lipitor #5. History of atrial fibrillation, chronic, rate controlled, patient is off Coumadin therapy due to bleeding in cervical area, recommended to be taken off Coumadin by neurosurgeon, explained to patient  the Plavix will not completely protect him from strokes, but might be better than aspirin therapy alone, advised to follow his stool and hemoglobin level as outpatient   DISCHARGE CONDITIONS:    Stable  CONSULTS OBTAINED:  Treatment Team:  Thana Farr, MD  DRUG ALLERGIES:  No Known Allergies  DISCHARGE MEDICATIONS:   Discharge Medication List as of 09/24/2015  4:43 PM    START taking these medications   Details  atorvastatin (LIPITOR) 40 MG tablet Take 1 tablet (40 mg total) by mouth daily at 6 PM., Starting 09/24/2015, Until Discontinued, Normal    clopidogrel (PLAVIX) 75 MG tablet Take 1 tablet (75 mg total) by mouth daily., Starting 09/24/2015, Until Discontinued, Normal      CONTINUE these medications which have CHANGED   Details  levothyroxine (SYNTHROID) 75 MCG tablet Take 1 tablet (75 mcg total) by mouth daily before breakfast., Starting 09/24/2015, Until Discontinued, Normal      CONTINUE these medications which have NOT CHANGED   Details  carvedilol (COREG) 3.125 MG tablet Take 1 tablet (3.125 mg total) by mouth 2 (two) times daily with a meal., Starting 08/26/2015, Until Discontinued, Normal    latanoprost (XALATAN) 0.005 % ophthalmic solution Place 1 drop into both eyes at bedtime., Starting 04/11/2013, Until Discontinued, Historical Med    lisinopril (PRINIVIL,ZESTRIL) 20 MG tablet Take 1 tablet (20 mg total) by mouth daily. KEEP OV., Starting 08/26/2015, Until Discontinued, Normal    Nutritional Supplements (BOOST HIGH PROTEIN PO) Take 1 Bottle by mouth daily as needed (Dietary supplement)., Until Discontinued, Historical Med    timolol (TIMOPTIC) 0.5 % ophthalmic solution Place 1 drop into both eyes every morning. , Starting 04/11/2013, Until Discontinued, Historical Med      STOP taking these medications     aspirin EC 81 MG tablet      simvastatin (ZOCOR) 20 MG tablet          DISCHARGE INSTRUCTIONS:    Patient is to follow-up with primary care physician and neurologist as outpatient  If you experience worsening of your admission symptoms, develop shortness of breath, life threatening emergency, suicidal or homicidal thoughts you must  seek medical attention immediately by calling 911 or calling your MD immediately  if symptoms less severe.  You Must read complete instructions/literature along with all the possible adverse reactions/side effects for all the Medicines you take and that have been prescribed to you. Take any new Medicines after you have completely understood and accept all the possible adverse reactions/side effects.   Please note  You were cared for by a hospitalist during your hospital stay. If you have any questions about your discharge medications or the care you received while you were in the hospital after you are discharged, you can call the unit and asked to speak with the hospitalist on call if the hospitalist that took care of you is not available. Once you are discharged, your primary care physician will handle any further medical issues. Please note that NO REFILLS for any discharge medications will be authorized once you are discharged, as it is imperative that you return to your primary care physician (or establish a relationship with a primary care physician if you do not have one) for your aftercare needs so that they can reassess your need for medications and monitor your lab values.    Today   CHIEF COMPLAINT:   Chief Complaint  Patient presents with  . Numbness    HISTORY OF PRESENT ILLNESS:  Jacob [name]  is a 82 y.o. male with a known history of chronic atrial fibrillation, hypertension, hyperlipidemia, anemia, history of coronary artery disease who presents to the hospital with complaints of left arm weakness lasting approximately one hour. He apparently was on Coumadin anticoagulation. However, due to her fall last year He was taken off it and has been managed on aspirin therapy alone. On arrival to the hospital. He was not noted to have any significant neurologic deficit. His initial CT of head revealed atrophy with extensive supratentorial small vessel disease, prior lacunar type infarcts  in left basilar ganglia was noted, but no acute infarct. Patient was admitted to the hospital for further evaluation. MRI of brain was performed which revealed small acute lacunar infarct in the right motor strip posterior right MCA territory, at the area of left upper extremity representation no other acute intracranial abnormality was found. Although small subacute lacunar infarct in the left. Motor area was also noted. Carotid ultrasound revealed mild 1-49% stenosis in proximal right and left internal carotid artery. I discussed case with patient's family extensively and recommended Plavix and different statin, Lipitor at the higher dose,  noting that it felt very likely will not completely protect patient from strokes, all questions from the family were answered. Echo results are still pending at the time of dictation.  Discussion by problem #1. Left upper extremity weakness due to small acute lacunar infarct in the right motor strip posterior right MCA territory, continue patient on Plavix, Lipitor, follow-up with primary care physician and neurologist as outpatient for further recommendations. It is recommended to follow-up echo results.  #2. Anemia, Hemoccult of stool was ordered, however, not received by the time of discharge, it is recommended to follow patient's hemoglobin level as outpatient closely #3. History of hypothyroidism, now iatrogenic hyperthyroidism with TSH of 0.143 and free T4 1.21, decrease Synthroid dose to 75 g daily, follow-up with primary care physician and check TSH in about 4 weeks #4. Peripheral vascular disease with mild, carotid artery stenosis, continue Plavix and Lipitor #5. History of atrial fibrillation, chronic, rate controlled, patient is off Coumadin therapy due to bleeding in cervical area, recommended to be taken off Coumadin by neurosurgeon, explained to patient the Plavix will not completely protect him from strokes, but might be better than aspirin therapy alone,  advised to follow his stool and hemoglobin level as outpatient     VITAL SIGNS:  Blood pressure 145/57, pulse 57, temperature 98.6 F (37 C), temperature source Oral, resp. rate 18, weight 65.409 kg (144 lb 3.2 oz), SpO2 100 %.  I/O:   Intake/Output Summary (Last 24 hours) at 09/24/15 1807 Last data filed at 09/24/15 1443  Gross per 24 hour  Intake 1010.67 ml  Output    200 ml  Net 810.67 ml    PHYSICAL EXAMINATION:  GENERAL:  82 y.o.-year-old patient lying in the bed with no acute distress.  EYES: Pupils equal, round, reactive to light and accommodation. No scleral icterus. Extraocular muscles intact.  HEENT: Head atraumatic, normocephalic. Oropharynx and nasopharynx clear.  NECK:  Supple, no jugular venous distention. No thyroid enlargement, no tenderness.  LUNGS: Normal breath sounds bilaterally, no wheezing, rales,rhonchi or crepitation. No use of accessory muscles of respiration.  CARDIOVASCULAR: S1, S2 normal. No murmurs, rubs, or gallops.  ABDOMEN: Soft, non-tender, non-distended. Bowel sounds present. No organomegaly or mass.  EXTREMITIES: No pedal edema, cyanosis, or clubbing.  NEUROLOGIC: Cranial nerves II through XII are intact. Muscle strength 5/5 in all extremities. Sensation intact. Gait not  checked.  PSYCHIATRIC: The patient is alert and oriented x 3.  SKIN: No obvious rash, lesion, or ulcer.   DATA REVIEW:   CBC  Recent Labs Lab 09/23/15 1911  WBC 6.1  HGB 10.9*  HCT 32.4*  PLT 228    Chemistries   Recent Labs Lab 09/23/15 1911  NA 139  K 4.4  CL 108  CO2 26  GLUCOSE 94  BUN 29*  CREATININE 1.12  CALCIUM 9.0  AST 22  ALT 17  ALKPHOS 87  BILITOT 0.6    Cardiac Enzymes  Recent Labs Lab 09/23/15 1911  TROPONINI <0.03    Microbiology Results  Results for orders placed or performed during the hospital encounter of 08/01/14  MRSA PCR Screening     Status: None   Collection Time: 08/01/14 11:26 PM  Result Value Ref Range Status    MRSA by PCR NEGATIVE NEGATIVE Final    Comment:        The GeneXpert MRSA Assay (FDA approved for NASAL specimens only), is one component of a comprehensive MRSA colonization surveillance program. It is not intended to diagnose MRSA infection nor to guide or monitor treatment for MRSA infections.   Culture, Urine     Status: None   Collection Time: 08/02/14  4:25 AM  Result Value Ref Range Status   Specimen Description URINE, CLEAN CATCH  Final   Special Requests NONE  Final   Culture  Setup Time   Final    08/02/2014 12:04 Performed at [facility] Count   Final    7,000 COLONIES/ML Performed at [facility]    Culture   Final    INSIGNIFICANT GROWTH Performed at [facility]    Report Status 08/03/2014 FINAL  Final    RADIOLOGY:  Ct Head Wo Contrast  09/23/2015  CLINICAL DATA:  One day history of left arm numbness EXAM: CT HEAD WITHOUT CONTRAST TECHNIQUE: Contiguous axial images were obtained from the base of the skull through the vertex without intravenous contrast. COMPARISON:  Head CT August 01, 2014; brain MRI August 02, 2014 FINDINGS: Mild diffuse atrophy is stable. There is no intracranial mass hemorrhage, extra-axial fluid collection, or midline shift. There is extensive small vessel disease throughout the centra semiovale bilaterally. Small vessel disease is also present throughout both lentiform nuclei as well as portions of the internal and external capsules bilaterally. There are prior lacunar infarcts in the left lentiform nucleus. No acute infarct is evident. Bony calvarium appears intact, although bones are diffusely osteoporotic. The mastoid air cells are clear. No intraorbital lesions are appreciable. IMPRESSION: 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 No acute infarct. No hemorrhage or mass effect. Bones diffusely osteoporotic. Electronically Signed   By:  Bretta Bang III M.D.   On: 09/23/2015 19:34   Mr Brain Wo Contrast  09/24/2015  CLINICAL DATA:  82 year old male with left arm numbness on the morning of presentation, improved. Initial encounter. EXAM: MRI HEAD WITHOUT CONTRAST TECHNIQUE: Multiplanar, multiecho pulse sequences of the brain and surrounding structures were obtained without intravenous contrast. COMPARISON:  Noncontrast head CT 09/23/2015.  Brain MRI 08/02/2014 FINDINGS: Major intracranial vascular flow voids are stable. There is a 6 mm focus of restricted diffusion in the right motor strip at the level of left upper extremity representation (series 5, image 32). No other convincing acute restricted diffusion in the brain. There is a subtle focus of increased trace  diffusion in the left pre motor area on series 5 image 33, not correlated on ADC. No associated hemorrhage or mass effect. Underlying widespread Patchy and confluent cerebral white matter T2 and FLAIR hyperintensity is stable. Superimposed dilated perivascular spaces, especially in the deep gray matter nuclei. No chronic cerebral blood products identified. No midline shift, mass effect, evidence of mass lesion, ventriculomegaly, extra-axial collection or acute intracranial hemorrhage. Cervicomedullary junction and pituitary are within normal limits. Stable visualized cervical spine. Visible internal auditory structures appear normal. Mastoids are clear. Paranasal sinuses are stable in clear. Orbit and scalp soft tissues are stable. Normal bone marrow signal. IMPRESSION: 1. Small acute lacunar infarct in the right motor strip (posterior right MCA territory), at the area of left upper extremity representation. No mass effect or hemorrhage. 2. No other acute intracranial abnormality. There may be a small subacute lacunar infarct in the left pre motor area. Electronically Signed   By: Odessa Fleming M.D.   On: 09/24/2015 12:51   US Carotid Bilateral  09/24/2015  CLINICAL DATA:  82 year old  male with acute lacunar infarct in the right motor strip. EXAM: BILATERAL CAROTID DUPLEX ULTRASOUND TECHNIQUE: Wallace Cullens scale imaging, color Doppler and duplex ultrasound were performed of bilateral carotid and vertebral arteries in the neck. COMPARISON:  Brain MRI 09/24/2015 FINDINGS: Criteria: Quantification of carotid stenosis is based on velocity parameters that correlate the residual internal carotid diameter with NASCET-based stenosis levels, using the diameter of the distal internal carotid lumen as the denominator for stenosis measurement. The following velocity measurements were obtained: RIGHT ICA:  107/22 cm/sec CCA:  66/14 cm/sec SYSTOLIC ICA/CCA RATIO:  1.6 DIASTOLIC ICA/CCA RATIO:  1.7 ECA:  130 cm/sec LEFT ICA:  112/18 cm/sec CCA:  76/12 cm/sec SYSTOLIC ICA/CCA RATIO:  1.5 DIASTOLIC ICA/CCA RATIO:  1.5 ECA:  116 cm/sec RIGHT CAROTID ARTERY: Heterogeneous and irregular atherosclerotic plaque at the carotid bifurcation extending into both the internal and external carotid arteries. By peak systolic velocity criteria, the degree of stenosis remains less than 50%. RIGHT VERTEBRAL ARTERY:  Patent with normal antegrade flow. LEFT CAROTID ARTERY: Heterogeneous atherosclerotic plaque at the carotid bifurcation extending into the proximal internal and external carotid arteries. By peak systolic velocity criteria, the estimated stenosis remains less than 50%. LEFT VERTEBRAL ARTERY:  Patent with normal antegrade flow. IMPRESSION: 1. Mild (1-49%) stenosis proximal right internal carotid artery secondary to irregular heterogeneous atherosclerotic plaque. 2. Mild (1-49%) stenosis proximal left internal carotid artery secondary to heterogeneous atherosclerotic plaque. 3. Vertebral arteries are patent with normal antegrade flow. Signed, Sterling Big, MD [facility] Electronically Signed   By: Malachy Moan M.D.   On: 09/24/2015 14:20    EKG:    Orders placed or performed during the hospital encounter of 09/23/15  . ED EKG  . ED EKG      Management plans discussed with the patient, family and they are in agreement.  CODE STATUS:     Code Status Orders        Start     Ordered   09/24/15 0127  Full code   Continuous     09/24/15 0126    Code Status History    Date Active Date Inactive Code Status Order ID Comments User Context   08/01/2014 11:59 PM 08/06/2014  7:41 PM DNR [id number]  Therisa Doyne, MD Inpatient      TOTAL TIME TAKING CARE OF THIS PATIENT: .  Discussed this family via interpreter and then patient's daughter for  approximately 20 minutes   Francille Wittmann M.D on 09/24/2015 at 6:07 PM  Between 7am to 6pm - Pager - [phone]  After 6pm go to [email or web address] - password [email or web address] [facility]  [facility]  Office  [phone]  CC: Primary care physician; Eustaquio Boyden, MD

## 2015-09-24 NOTE — Evaluation (Signed)
Physical Therapy Evaluation Patient Details Name: Jacob [name] MRN: [id number] DOB: 1916-12-26 Today's Date: 09/24/2015   History of Present Illness  The patient presents emergency department complaining of weakness in his left arm. Weakness lasted approximately one hour. It was not associated with chest pain, shortness of breath, nausea or vomiting. The patient has known atrial fibrillation and has not been on anticoagulation since last year due to falls risk. Though his symptoms have resolved, the emergency department staff called for admission due to his risk factors for stroke. At time of PT evaluation pt reports full resolution of symptoms.  Clinical Impression  Pt demonstrates excellent mobility with respect to his age norms. He is able to ambulate mostly without assistive device however uses a single point cane at home. No LOB with ambulation without assistive device including head turns, 180 degree turns, and gait speed changes. Pt has some higher level balance deficits which are normal for him including limited single leg stance balance. However pt functions very well at home and has returned to his baseline. Pt denies any further symptoms and no focal weakness or numbness/tingling identified. Pt has no further PT needs at this time. Pt will be safe to return home with family when medically appropriate.    Follow Up Recommendations No PT follow up    Equipment Recommendations  None recommended by PT    Recommendations for Other Services       Precautions / Restrictions Precautions Precautions: Fall Restrictions Weight Bearing Restrictions: No      Mobility  Bed Mobility Overal bed mobility: Independent             General bed mobility comments: Good speed and sequencing noted  Transfers Overall transfer level: Needs assistance Equipment used: None Transfers: Sit to/from Stand Sit to Stand: Supervision         General transfer comment: Good speed, sequencing,  and stability noted with sit to stand transfers.  Ambulation/Gait Ambulation/Gait assistance: Min guard Ambulation Distance (Feet): 400 Feet Assistive device: Rolling walker (2 wheeled) (progressing to no assistive device) Gait Pattern/deviations: WFL(Within Functional Limits)   Gait velocity interpretation: at or above normal speed for age/gender General Gait Details: Good speed, step length and sequencing with use of rolling walker. Pt progressed to no assistive device and demonstrates reasonable stabiilty with close guarding. Pt able to perform horizontal and vertical head turns as well as 180 degree turns without notable instability. Pt denies DOE during [employer] (Stroke Patients Only)       Balance Overall balance assessment: Needs assistance Sitting-balance support: No upper extremity supported Sitting balance-Leahy Scale: Normal     Standing balance support: No upper extremity supported Standing balance-Leahy Scale: Fair Standing balance comment: Negative Rhomberg. Single leg stance 2-3 seconds                             Pertinent Vitals/Pain Pain Assessment: No/denies pain    Home Living Family/patient expects to be discharged to:: Private residence Living Arrangements: Spouse/significant other Available Help at Discharge: Family Type of Home: House Home Access: Stairs to enter Entrance Stairs-Rails: Left Entrance Stairs-Number of Steps: 3 Home Layout: One level Home Equipment: Walker - 2 wheels;Tub bench;Cane - single point      Prior Function Level of Independence: Independent with assistive device(s)  Comments: Use of single point cane for community ambulation     Hand Dominance   Dominant Hand: Right    Extremity/Trunk Assessment   Upper Extremity Assessment: Overall WFL for tasks assessed;RUE deficits/detail RUE Deficits / Details: No focal weakness or  numbness/tingling to light touch throughout UE/LE         Lower Extremity Assessment: Overall WFL for tasks assessed         Communication   Communication: Prefers language other than [place];Interpreter utilized;Other (comment) (Spanish)  Cognition Arousal/Alertness: Awake/alert Behavior During Therapy: WFL for tasks assessed/performed Overall Cognitive Status: Within Functional Limits for tasks assessed                      General Comments      Exercises        Assessment/Plan    PT Assessment Patent does not need any further PT services  PT Diagnosis     PT Problem List    PT Treatment Interventions     PT Goals (Current goals can be found in the Care Plan section)      Frequency     Barriers to discharge        Co-evaluation               End of Session Equipment Utilized During Treatment: Gait belt Activity Tolerance: Patient tolerated treatment well Patient left: in bed;with call bell/phone within reach;with family/visitor present;with nursing/sitter in room (SLP performing PO trial) Nurse Communication: Mobility status    Functional Assessment Tool Used: clinical judgement, SLS, rhomberg Functional Limitation: Mobility: Walking and moving around Mobility: Walking and Moving Around Current Status (Z6109): At least 1 percent but less than 20 percent impaired, limited or restricted Mobility: Walking and Moving Around Goal Status [phone]): At least 1 percent but less than 20 percent impaired, limited or restricted Mobility: Walking and Moving Around Discharge Status [phone]): At least 1 percent but less than 20 percent impaired, limited or restricted    Time: 0945-1015 PT Time Calculation (min) (ACUTE ONLY): 30 min   Charges:   PT Evaluation $PT Eval Moderate Complexity: 1 Procedure PT Treatments $Gait Training: 8-22 mins   PT G Codes:   PT G-Codes **NOT FOR INPATIENT CLASS** Functional Assessment Tool Used: clinical judgement, SLS,  rhomberg Functional Limitation: Mobility: Walking and moving around Mobility: Walking and Moving Around Current Status (B1478): At least 1 percent but less than 20 percent impaired, limited or restricted Mobility: Walking and Moving Around Goal Status [phone]): At least 1 percent but less than 20 percent impaired, limited or restricted Mobility: Walking and Moving Around Discharge Status [phone]): At least 1 percent but less than 20 percent impaired, limited or restricted   Lynnea Maizes PT, DPT   Tanesia Butner 09/24/2015, 10:41 AM

## 2015-09-24 NOTE — H&P (Signed)
Jacob [name] is an 82 y.o. male.   Chief Complaint: Weakness HPI: The patient presents emergency department complaining of weakness in his left arm. Weakness lasted approximately one hour. It was not associated with chest pain, shortness of breath, nausea or vomiting. The patient has known atrial fibrillation and has not been on anticoagulation since last year due to falls risk. Though his symptoms have resolved, the emergency department staff called for admission due to his risk factors for stroke.  Past Medical History  Diagnosis Date  . CAD in native artery -->  1994    referred for CABG  . S/P CABG x 4 1994    a) 1994: LIMA-LAD, SVG-RI, SVG-OM, SVG-RCA; b) 09/2006, Persantine Myoview: fixed inferior defect consistent with infarct/scar without peri-infarct ischemia;; 2-D echo: Mild-mod concentric LVH. Normal EF.  Abnormal relaxation, aortic sclerosis with mild-mod AI  . Chronic atrial fibrillation ([place]) 12/15/2012  . Long term (current) use of anticoagulants 12/15/2012    warfarin  . Essential hypertension 05/23/2013  . Dyslipidemia, goal LDL below 70[272.4]      - on statin, followed by primary physician.   . Glaucoma   . History of kidney stones   . H/O seasonal allergies   . Diverticulitis   . C4 cervical fracture ([place]) 2015    Past Surgical History  Procedure Laterality Date  . Coronary artery bypass graft  1994    LIMA-LAD, SVG-RI, SVG-OM, SVG-RCA   . Persantine myoview  February 2008    Fixed inferior wall defect-consistent with infarct/scar without peri-infarct ischemia  . Transthoracic echocardiogram  February 2008    Mild/mod conc LVH, Nl Size & Fxn, Gr 1 DD(abnormal relaxation), mild to moderate aortic regurgitation and mildly sclerotic aortic valve.  Marland Kitchen Appendectomy    . Hand surgery Left 1985  . Femur surgery Left 2000    replacement- due to fall    Family History  Problem Relation Age of Onset  . Liver cancer Mother   . Prostate cancer Father    Social History:   reports that he has never smoked. He has never used smokeless tobacco. He reports that he does not drink alcohol or use illicit drugs.  Allergies: No Known Allergies  Medications Prior to Admission  Medication Sig Dispense Refill  . aspirin EC 81 MG tablet Take 81 mg by mouth daily.    . carvedilol (COREG) 3.125 MG tablet Take 1 tablet (3.125 mg total) by mouth 2 (two) times daily with a meal. 60 tablet 2  . latanoprost (XALATAN) 0.005 % ophthalmic solution Place 1 drop into both eyes at bedtime.    Marland Kitchen levothyroxine (SYNTHROID, LEVOTHROID) 100 MCG tablet Take 1 tablet (100 mcg total) by mouth every morning. 90 tablet 3  . lisinopril (PRINIVIL,ZESTRIL) 20 MG tablet Take 1 tablet (20 mg total) by mouth daily. KEEP OV. 30 tablet 2  . Nutritional Supplements (BOOST HIGH PROTEIN PO) Take 1 Bottle by mouth daily as needed (Dietary supplement).    . simvastatin (ZOCOR) 20 MG tablet Take 1 tablet (20 mg total) by mouth daily at 6 PM. KEEP OV. 30 tablet 2  . timolol (TIMOPTIC) 0.5 % ophthalmic solution Place 1 drop into both eyes every morning.       Results for orders placed or performed during the hospital encounter of 09/23/15 (from the past 48 hour(s))  Protime-INR     Status: None   Collection Time: 09/23/15  7:11 PM  Result Value Ref Range   Prothrombin Time 13.5 11.4 -  15.0 seconds   INR 1.01   APTT     Status: None   Collection Time: 09/23/15  7:11 PM  Result Value Ref Range   aPTT 28 24 - 36 seconds  CBC     Status: Abnormal   Collection Time: 09/23/15  7:11 PM  Result Value Ref Range   WBC 6.1 3.8 - 10.6 K/uL   RBC 3.41 (L) 4.40 - 5.90 MIL/uL   Hemoglobin 10.9 (L) 13.0 - 18.0 g/dL   HCT 32.4 (L) 40.0 - 52.0 %   MCV 95.0 80.0 - 100.0 fL   MCH 31.9 26.0 - 34.0 pg   MCHC 33.6 32.0 - 36.0 g/dL   RDW 13.7 11.5 - 14.5 %   Platelets 228 150 - 440 K/uL  Differential     Status: None   Collection Time: 09/23/15  7:11 PM  Result Value Ref Range   Neutrophils Relative % 61 %   Neutro  Abs 3.7 1.4 - 6.5 K/uL   Lymphocytes Relative 23 %   Lymphs Abs 1.4 1.0 - 3.6 K/uL   Monocytes Relative 10 %   Monocytes Absolute 0.6 0.2 - 1.0 K/uL   Eosinophils Relative 5 %   Eosinophils Absolute 0.3 0 - 0.7 K/uL   Basophils Relative 1 %   Basophils Absolute 0.0 0 - 0.1 K/uL  Comprehensive metabolic panel     Status: Abnormal   Collection Time: 09/23/15  7:11 PM  Result Value Ref Range   Sodium 139 135 - 145 mmol/L   Potassium 4.4 3.5 - 5.1 mmol/L   Chloride 108 101 - 111 mmol/L   CO2 26 22 - 32 mmol/L   Glucose, Bld 94 65 - 99 mg/dL   BUN 29 (H) 6 - 20 mg/dL   Creatinine, Ser 1.12 0.61 - 1.24 mg/dL   Calcium 9.0 8.9 - 10.3 mg/dL   Total Protein 7.8 6.5 - 8.1 g/dL   Albumin 3.8 3.5 - 5.0 g/dL   AST 22 15 - 41 U/L   ALT 17 17 - 63 U/L   Alkaline Phosphatase 87 38 - 126 U/L   Total Bilirubin 0.6 0.3 - 1.2 mg/dL   GFR calc non Af Amer 53 (L) >60 mL/min   GFR calc Af Amer >60 >60 mL/min    Comment: (NOTE) The eGFR has been calculated using the CKD EPI equation. This calculation has not been validated in all clinical situations. eGFR's persistently <60 mL/min signify possible Chronic Kidney Disease.    Anion gap 5 5 - 15  Troponin I     Status: None   Collection Time: 09/23/15  7:11 PM  Result Value Ref Range   Troponin I <0.03 <0.031 ng/mL    Comment:        NO INDICATION OF MYOCARDIAL INJURY.   TSH     Status: Abnormal   Collection Time: 09/23/15  7:11 PM  Result Value Ref Range   TSH 0.143 (L) 0.350 - 4.500 uIU/mL   Ct Head Wo Contrast  09/23/2015  CLINICAL DATA:  One day history of left arm numbness EXAM: CT HEAD WITHOUT CONTRAST TECHNIQUE: Contiguous axial images were obtained from the base of the skull through the vertex without intravenous contrast. COMPARISON:  Head CT August 01, 2014; brain MRI August 02, 2014 FINDINGS: Mild diffuse atrophy is stable. There is no intracranial mass hemorrhage, extra-axial fluid collection, or midline shift. There is extensive  small vessel disease throughout the centra semiovale bilaterally. Small vessel disease is also present  throughout both lentiform nuclei as well as portions of the internal and external capsules bilaterally. There are prior lacunar infarcts in the left lentiform nucleus. No acute infarct is evident. Bony calvarium appears intact, although bones are diffusely osteoporotic. The mastoid air cells are clear. No intraorbital lesions are appreciable. IMPRESSION: 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 No acute infarct. No hemorrhage or mass effect. Bones diffusely osteoporotic. Electronically Signed   By: Lowella Grip III M.D.   On: 09/23/2015 19:34    Review of Systems  Constitutional: Negative for fever and chills.  HENT: Negative for sore throat and tinnitus.   Eyes: Negative for blurred vision and redness.  Respiratory: Negative for cough and shortness of breath.   Cardiovascular: Negative for chest pain, palpitations, orthopnea and PND.  Gastrointestinal: Negative for nausea, vomiting, abdominal pain and diarrhea.  Genitourinary: Negative for dysuria, urgency and frequency.  Musculoskeletal: Negative for myalgias and joint pain.  Skin: Negative for rash.       No lesions  Neurological: Positive for focal weakness (resolved). Negative for speech change and weakness.  Endo/Heme/Allergies: Does not bruise/bleed easily.       No temperature intolerance  Psychiatric/Behavioral: Negative for depression and suicidal ideas.    Blood pressure 161/81, pulse 68, temperature 97.7 F (36.5 C), temperature source Oral, resp. rate 19, weight 65.409 kg (144 lb 3.2 oz), SpO2 100 %. Physical Exam  Nursing note and vitals reviewed. Constitutional: He is oriented to person, place, and time. He appears well-developed and well-nourished. No distress.  HENT:  Head: Normocephalic and atraumatic.  Mouth/Throat: Oropharynx is clear and  moist.  Eyes: Conjunctivae and EOM are normal. Pupils are equal, round, and reactive to light. No scleral icterus.  Neck: Normal range of motion. Neck supple. No JVD present. Carotid bruit is not present. No tracheal deviation present. No thyromegaly present.  Cardiovascular: Normal rate and normal heart sounds.  An irregularly irregular rhythm present. Exam reveals no gallop and no friction rub.   No murmur heard. GI: Soft. Bowel sounds are normal. He exhibits no distension. There is no tenderness.  Genitourinary:  Deferred  Musculoskeletal: Normal range of motion. He exhibits no edema.  Lymphadenopathy:    He has no cervical adenopathy.  Neurological: He is alert and oriented to person, place, and time. No cranial nerve deficit.  Skin: Skin is warm and dry. No rash noted. No erythema.  Psychiatric: He has a normal mood and affect. His behavior is normal. Judgment and thought content normal.     Assessment/Plan This is a 82 year old Hispanic male admitted for transient ischemic attack. 1. TIA: No neurologic deficits. I have ordered carotid ultrasounds as well as echocardiogram and MRI of the brain. Neurology consult placed 2. Coronary artery disease: Status post CABG; stable. Continue aspirin 3. Chronic atrial fibrillation: Controlled; no anticoagulation at this time. Cardiology consult placed. 4. Essential hypertension: Continue Coreg and lisinopril 5. Hypothyroidism: Continue Synthroid 6. Hyperlipidemia: Continue statin therapy 7. Glaucoma: Continue Xalatan and timolol 8. DVT prophylaxis: Heparin 9. GI prophylaxis: None as the patient is not critically ill The patient is a full code. Time spent on admission orders and patient care approximately 45 minutes  Harrie Foreman 09/24/2015, 4:47 AM

## 2015-09-24 NOTE — Progress Notes (Signed)
OT Cancellation Note  Patient Details Name: Jacob [name] MRN: [id number] DOB: 1917/07/13   Cancelled Treatment:    Reason Eval/Treat Not Completed: Other (comment). Spoke with Physical Therapy, who stated that the patient does not have any Occupational Therapy needs.  Ocie Cornfield 09/24/2015, 1:04 PM

## 2015-09-24 NOTE — Progress Notes (Addendum)
Discharge instructions given and went over with daughter at bedside. All questions answered. Prescriptions to be picked up at patient pharmacy. Patient discharged home with daughter and wife via wheelchair by nursing staff. Bo Mcclintock, RN  Daughter questioning outpatient Neurology appointment - no information regarding this on discharge instructions. MD paged. 4:37 PM

## 2015-09-24 NOTE — Evaluation (Signed)
Clinical/Bedside Swallow Evaluation Patient Details  Name: Jacob [name] MRN: [id number] Date of Birth: 1917/06/29  Today's Date: 09/24/2015 Time: SLP Start Time (ACUTE ONLY): 1115 SLP Stop Time (ACUTE ONLY): 1200 SLP Time Calculation (min) (ACUTE ONLY): 45 min  Past Medical History:  Past Medical History  Diagnosis Date  . CAD in native artery -->  1994    referred for CABG  . S/P CABG x 4 1994    a) 1994: LIMA-LAD, SVG-RI, SVG-OM, SVG-RCA; b) 09/2006, Persantine Myoview: fixed inferior defect consistent with infarct/scar without peri-infarct ischemia;; 2-D echo: Mild-mod concentric LVH. Normal EF.  Abnormal relaxation, aortic sclerosis with mild-mod AI  . Chronic atrial fibrillation ([facility]) 12/15/2012  . Long term (current) use of anticoagulants 12/15/2012    warfarin  . Essential hypertension 05/23/2013  . Dyslipidemia, goal LDL below 70[272.4]      - on statin, followed by primary physician.   . Glaucoma   . History of kidney stones   . H/O seasonal allergies   . Diverticulitis   . C4 cervical fracture ([facility]) 2015   Past Surgical History:  Past Surgical History  Procedure Laterality Date  . Coronary artery bypass graft  1994    LIMA-LAD, SVG-RI, SVG-OM, SVG-RCA   . Persantine myoview  February 2008    Fixed inferior wall defect-consistent with infarct/scar without peri-infarct ischemia  . Transthoracic echocardiogram  February 2008    Mild/mod conc LVH, Nl Size & Fxn, Gr 1 DD(abnormal relaxation), mild to moderate aortic regurgitation and mildly sclerotic aortic valve.  Marland Kitchen Appendectomy    . Hand surgery Left 1985  . Femur surgery Left 2000    replacement- due to fall   HPI:  patient presents emergency department complaining of weakness in his left arm. Weakness lasted approximately one hour. It was not associated with chest pain, shortness of breath, nausea or vomiting. The patient has known atrial fibrillation and has not been on anticoagulation since last year due to falls  risk. Though his symptoms have resolved, the emergency department staff called for admission due to his risk factors for stroke. Pt has a h/o CAD, CABG, HTN, diverticulitis and other medical issues. He denies any difficulty w/ swallowing foods/liquids and stated his speech-language abilities are "good". Family agreed.   Assessment / Plan / Recommendation Clinical Impression  Pt appeared to adequately tolerate trials of thin liquids and purees w/ no reported problems swallowing other soft solid foods at morning meal(per pt, family and staff). Pt exhibited no oral phase deficits w/ po trials as well. He fed self and followed general aspiration precautions. Pt appears at reduced risk for aspiration w/ po's currently; follows aspiration precautions baseline to include swallowing pills w/ puree at home. Rec. continue w/ current diet as ordered w/ precautions. Pt/family agreed. This evaluation was performed w/ assistance of Interpreter in room.     Aspiration Risk   (reduced)    Diet Recommendation  age appropriate, regular diet; thin liquids; aspiration precautions  Medication Administration: Whole meds with puree (baseline)    Other  Recommendations Recommended Consults:  (none) Oral Care Recommendations: Oral care BID;Patient independent with oral care   Follow up Recommendations  None    Frequency and Duration            Prognosis Prognosis for Safe Diet Advancement: Good Barriers to Reach Goals:  (none)      Swallow Study   General Date of Onset: 09/23/15 HPI: patient presents emergency department complaining of weakness in his left arm.  Weakness lasted approximately one hour. It was not associated with chest pain, shortness of breath, nausea or vomiting. The patient has known atrial fibrillation and has not been on anticoagulation since last year due to falls risk. Though his symptoms have resolved, the emergency department staff called for admission due to his risk factors for stroke.  Pt has a h/o CAD, CABG, HTN, diverticulitis and other medical issues. He denies any difficulty w/ swallowing foods/liquids and stated his speech-language abilities are "good". Family agreed. Type of Study: Bedside Swallow Evaluation Previous Swallow Assessment: none Diet Prior to this Study: Regular;Thin liquids (eats softer foods and "not too much") Temperature Spikes Noted: No Respiratory Status: Room air History of Recent Intubation: No Behavior/Cognition: Alert;Cooperative;Pleasant mood Oral Cavity Assessment: Within Functional Limits Oral Care Completed by SLP: Recent completion by staff Oral Cavity - Dentition: Adequate natural dentition;Missing dentition Vision: Functional for self-feeding Self-Feeding Abilities: Able to feed self Patient Positioning: Upright in bed (EOB) Baseline Vocal Quality: Normal Volitional Cough: Strong Volitional Swallow: Able to elicit    Oral/Motor/Sensory Function Overall Oral Motor/Sensory Function: Within functional limits   Ice Chips Ice chips: Not tested   Thin Liquid Thin Liquid: Within functional limits Presentation: Cup (several ozs) Other Comments: when drinking via straw x1 and swallowing pills w/ NSG, pt exhibited mild cough. He later stated he takes his pills in puree at home.     Nectar Thick Nectar Thick Liquid: Not tested   Honey Thick Honey Thick Liquid: Not tested   Puree Puree: Within functional limits Presentation: Self Fed;Spoon (~3 ozs)   Solid   GO   Solid: Not tested Other Comments: pt declined stating he had already eaten breakfast    Functional Assessment Tool Used: clinical judgement Functional Limitations: Swallowing Swallow Current Status (W0981): At least 1 percent but less than 20 percent impaired, limited or restricted Swallow Goal Status [phone]): At least 1 percent but less than 20 percent impaired, limited or restricted Swallow Discharge Status [phone]): At least 1 percent but less than 20 percent impaired,  limited or restricted   Jerilynn Som, MS, CCC-SLP  Watson,Katherine 09/24/2015,3:37 PM

## 2015-09-24 NOTE — Progress Notes (Signed)
*  PRELIMINARY RESULTS* Echocardiogram 2D Echocardiogram has been performed.  Georgann Housekeeper Hege 09/24/2015, 2:44 PM

## 2015-09-24 NOTE — Progress Notes (Signed)
MD placed outpatient Neurology appointment. Patient discharged. See previous notes. Bo Mcclintock, RN

## 2015-09-25 ENCOUNTER — Telehealth: Payer: Self-pay

## 2015-09-25 NOTE — Telephone Encounter (Signed)
Attempted to reach patient for post-discharge/TCM call. Left voicemail message with contact info.

## 2015-09-25 NOTE — Telephone Encounter (Signed)
Kathie Rhodes pt daughter returned your  Cell [phone] or [phone]

## 2015-09-28 NOTE — Telephone Encounter (Signed)
Patient was admitted and discharged from [facility] from 09/23/15-09/24/15 for complaint of left-sided weakness. Daughter, Kathie Rhodes, completed TCM call.   Transition Care Management Follow-up Telephone Call   Date discharged? 09/24/2015   How have you been since you were released from the hospital? No new issues or concerns verbalized by daughter   Do you understand why you were in the hospital? yes   Do you understand the discharge instructions? yes   Where were you discharged to? Home   Items Reviewed:  Medications reviewed: yes  Allergies reviewed: yes  Dietary changes reviewed: yes  Referrals reviewed: yes   Functional Questionnaire:   Activities of Daily Living (ADLs):   He states they are independent in the following: ambulation, feeding, continence, grooming, toileting and dressing States they require assistance with the following: bathing and hygiene (per daughter, this was occurring prior to hospitalization)   Any transportation issues/concerns?: no, daughter and son-in-law provide transportation   Any patient concerns? no   Confirmed importance and date/time of follow-up visits scheduled yes  Provider Appointment booked with Dr. Sharen Hones on 10/06/15 @ 4PM  Confirmed with patient if condition begins to worsen call PCP or go to the ER.  Patient was given the office number and encouraged to call back with question or concerns.  : yes

## 2015-10-06 ENCOUNTER — Encounter: Payer: Self-pay | Admitting: *Deleted

## 2015-10-06 ENCOUNTER — Ambulatory Visit (INDEPENDENT_AMBULATORY_CARE_PROVIDER_SITE_OTHER): Payer: PPO | Admitting: Family Medicine

## 2015-10-06 ENCOUNTER — Encounter: Payer: Self-pay | Admitting: Family Medicine

## 2015-10-06 VITALS — BP 116/76 | HR 66 | Temp 97.5°F | Wt 144.0 lb

## 2015-10-06 DIAGNOSIS — I482 Chronic atrial fibrillation, unspecified: Secondary | ICD-10-CM

## 2015-10-06 DIAGNOSIS — E785 Hyperlipidemia, unspecified: Secondary | ICD-10-CM

## 2015-10-06 DIAGNOSIS — I10 Essential (primary) hypertension: Secondary | ICD-10-CM | POA: Diagnosis not present

## 2015-10-06 DIAGNOSIS — D649 Anemia, unspecified: Secondary | ICD-10-CM

## 2015-10-06 DIAGNOSIS — I63511 Cerebral infarction due to unspecified occlusion or stenosis of right middle cerebral artery: Secondary | ICD-10-CM

## 2015-10-06 DIAGNOSIS — E039 Hypothyroidism, unspecified: Secondary | ICD-10-CM | POA: Diagnosis not present

## 2015-10-06 NOTE — Assessment & Plan Note (Signed)
TSH elevated at hospital. Recheck today.
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
## 2015-10-06 NOTE — Assessment & Plan Note (Signed)
Reviewed recent dx with patient as well as treatment/management options.  Continue plavix for now in replacement of coumadin. Will refer to neuro to establish care as well.
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
## 2015-10-06 NOTE — Assessment & Plan Note (Signed)
Recheck levels today

## 2015-10-06 NOTE — Patient Instructions (Addendum)
Esta bien hoy. sangre de revision hoy Le haremos cita con neurologo en [place] siga medicinas como hasta ahora. regresar para medicare wellness visit

## 2015-10-06 NOTE — Assessment & Plan Note (Signed)
Chronic, stable on atorvastatin - continue

## 2015-10-06 NOTE — Progress Notes (Signed)
Pre visit review using our clinic review tool, if applicable. No additional management support is needed unless otherwise documented below in the visit note. 

## 2015-10-06 NOTE — Assessment & Plan Note (Signed)
Chronic, stable. Continue current regimen. 

## 2015-10-07 ENCOUNTER — Encounter: Payer: Self-pay | Admitting: Family Medicine

## 2015-10-07 LAB — CBC WITH DIFFERENTIAL/PLATELET
BASOS ABS: 0 10*3/uL (ref 0.0–0.1)
Basophils Relative: 0 % (ref 0.0–3.0)
EOS ABS: 0.3 10*3/uL (ref 0.0–0.7)
Eosinophils Relative: 4.2 % (ref 0.0–5.0)
HCT: 34.3 % — ABNORMAL LOW (ref 39.0–52.0)
HEMOGLOBIN: 11.4 g/dL — AB (ref 13.0–17.0)
LYMPHS PCT: 19.6 % (ref 12.0–46.0)
Lymphs Abs: 1.4 10*3/uL (ref 0.7–4.0)
MCHC: 33.2 g/dL (ref 30.0–36.0)
MCV: 94.9 fl (ref 78.0–100.0)
MONO ABS: 0.8 10*3/uL (ref 0.1–1.0)
Monocytes Relative: 11.4 % (ref 3.0–12.0)
Neutro Abs: 4.5 10*3/uL (ref 1.4–7.7)
Neutrophils Relative %: 64.8 % (ref 43.0–77.0)
Platelets: 275 10*3/uL (ref 150.0–400.0)
RBC: 3.62 Mil/uL — AB (ref 4.22–5.81)
RDW: 14.2 % (ref 11.5–15.5)
WBC: 6.9 10*3/uL (ref 4.0–10.5)

## 2015-10-07 LAB — BASIC METABOLIC PANEL
BUN: 24 mg/dL — ABNORMAL HIGH (ref 6–23)
CHLORIDE: 108 meq/L (ref 96–112)
CO2: 29 meq/L (ref 19–32)
Calcium: 9.9 mg/dL (ref 8.4–10.5)
Creatinine, Ser: 1.29 mg/dL (ref 0.40–1.50)
GFR: 54.77 mL/min — AB (ref >60.00)
GLUCOSE: 96 mg/dL (ref 70–99)
POTASSIUM: 4.8 meq/L (ref 3.5–5.1)
SODIUM: 145 meq/L (ref 135–145)

## 2015-10-07 LAB — TSH: TSH: 0.29 u[IU]/mL — AB (ref 0.35–4.50)

## 2015-10-07 LAB — T4, FREE: Free T4: 1.03 ng/dL (ref 0.60–1.60)

## 2015-10-07 MED ORDER — CLOPIDOGREL BISULFATE 75 MG PO TABS: 75.0000 mg | ORAL_TABLET | Freq: Every day | ORAL | Status: DC

## 2015-10-07 MED ORDER — LEVOTHYROXINE SODIUM 75 MCG PO TABS: 75.0000 ug | ORAL_TABLET | Freq: Every day | ORAL | Status: DC

## 2015-10-07 MED ORDER — ATORVASTATIN CALCIUM 40 MG PO TABS: 40.0000 mg | ORAL_TABLET | Freq: Every day | ORAL | Status: DC

## 2015-10-07 MED ORDER — LISINOPRIL 20 MG PO TABS: 20.0000 mg | ORAL_TABLET | Freq: Every day | ORAL | Status: DC

## 2015-10-07 MED ORDER — CARVEDILOL 3.125 MG PO TABS: 3.1250 mg | ORAL_TABLET | Freq: Two times a day (BID) | ORAL | Status: DC

## 2015-10-22 ENCOUNTER — Encounter: Payer: Self-pay | Admitting: Cardiology

## 2015-10-22 ENCOUNTER — Ambulatory Visit (INDEPENDENT_AMBULATORY_CARE_PROVIDER_SITE_OTHER): Payer: PPO | Admitting: Cardiology

## 2015-10-22 VITALS — BP 118/54 | HR 57 | Ht 66.0 in | Wt 143.0 lb

## 2015-10-22 DIAGNOSIS — I482 Chronic atrial fibrillation, unspecified: Secondary | ICD-10-CM

## 2015-10-22 DIAGNOSIS — E785 Hyperlipidemia, unspecified: Secondary | ICD-10-CM

## 2015-10-22 DIAGNOSIS — I10 Essential (primary) hypertension: Secondary | ICD-10-CM

## 2015-10-22 DIAGNOSIS — I2510 Atherosclerotic heart disease of native coronary artery without angina pectoris: Secondary | ICD-10-CM | POA: Diagnosis not present

## 2015-10-22 DIAGNOSIS — I63511 Cerebral infarction due to unspecified occlusion or stenosis of right middle cerebral artery: Secondary | ICD-10-CM

## 2015-10-22 NOTE — Patient Instructions (Addendum)
  WILL DISCUSS WHETHER RESTART WARFARIN  Your physician wants you to follow-up in MAY 2017 WITH DR HARDING --30 MIN  You will receive a reminder letter in the mail two months in advance. If you don't receive a letter, please call our office to schedule the follow-up appointment.  If you need a refill on your cardiac medications before your next appointment, please call your pharmacy.

## 2015-10-22 NOTE — Progress Notes (Signed)
PCP: Eustaquio Boyden, MD  Clinic Note: Chief Complaint  Patient presents with  . Annual Exam    AFIB//pt states no Sx.  . Stroke Symptoms  . Atrial Fibrillation  . Coronary Artery Disease    HPI: Jacob [name] is a 82 y.o. male with a PMH below who presents today for delayed annual f/u for CAD-CABG & Chronic Afib. He is a Spanish-speaking only elderly gentleman who is originally from [place].  He only speaks Spanish, but is always coming by his granddaughter who serves as an adequate interpreter.  CATCHER DEHOYOS was last seen in Nov 2015 -- unfortunately shortly after that visit, he was admitted for a fall when he did damage on his neck -- he had a perivertebral hematoma with a C4-C5 fracture. nd I simply felt that this could be decision left to his primary physician or to neurosurgery. Unfortunately I don't think this was fully understood. He therefore was not restarted back on warfarin at all. He is now presenting after a stroke.  Recent Hospitalizations: Sep 23, 2015 - L Sided weakness - Acute right MCA stroke (HCC) -- had been off Warfarin after a fall in the autum of 2016. (per NSgx recommendation -- as far as I know he was supposed been following up with Dr. Venetia Maxon shortly after that admission.). MRI of brain was performed which revealed small acute lacunar infarct in the right motor strip posterior right MCA territory, at the area of left upper extremity representation no other acute intracranial abnormality was found. Although small subacute lacunar infarct in the left. Motor area was also noted. Carotid ultrasound revealed mild 1-49% stenosis in proximal right and left internal carotid artery. I discussed case with patient's family extensively and recommended Plavix and different statin, Lipitor at the higher dose, noting that it felt very likely will not completely protect patient from strokes, all questions from the family were answered.  Studies Reviewed:  Echo 09/2015:  - Left  ventricle: The cavity size was normal. There was mild concentric hypertrophy. Systolic function was normal. The estimated ejection fraction was in the range of 60% to 65%. Wall motion was normal; there were no regional wall motion abnormalities. Left ventricular diastolic function parameters were normal. - Aortic valve: There was mild regurgitation. - Left atrium: The atrium was moderately dilated. - Right ventricle: Systolic function was normal. - Right atrium: The atrium was mildly dilated. - Pulmonary arteries: Systolic pressure was mildly elevated. PA peak pressure: 45 mm Hg (S).  Interval History: [name] presents today doing quite well overall. He really doesn't have any residual effect of his stroke. He denies any cardiac symptoms whatsoever. No sense of rapid irregular heartbeats or palpitations. No anginal symptoms with rest or exertion. He is back to full activity level. He is very upset that he had a stroke, citing the fact that he was not on warfarin as the cause. When he doesn't realizes that he had been taken off of the warfarin based on recommendations of neurosurgery following his fall. He was asked to start on Plavix at the time of his stroke. Otherwise from a cardiac review of symptoms standpoint: No chest pain or shortness of breath with rest or exertion. No PND, orthopnea or edema. No palpitations, lightheadedness, dizziness, weakness or syncope/near syncope. No recurrent TIA/amaurosis fugax symptoms. No melena, hematochezia, hematuria, or epstaxis. No claudication.  ROS: A comprehensive was performed. Review of Systems  Constitutional: Negative for malaise/fatigue.  HENT: Negative for nosebleeds.   Cardiovascular: Negative.  Gastrointestinal: Negative for blood in stool and melena.  Genitourinary: Negative for hematuria.  Musculoskeletal: Negative for myalgias and joint pain.  Neurological: Negative for weakness and headaches.       No residual weakness from stroke    Endo/Heme/Allergies: Does not bruise/bleed easily.  Psychiatric/Behavioral: Negative for depression and memory loss. The patient is not nervous/anxious.   All other systems reviewed and are negative.   Past Medical History  Diagnosis Date  . CAD in native artery -->  1994    referred for CABG  . S/P CABG x 4 1994    a) 1994: LIMA-LAD, SVG-RI, SVG-OM, SVG-RCA; b) 09/2006, Persantine Myoview: fixed inferior defect consistent with infarct/scar without peri-infarct ischemia;; 2-D echo: Mild-mod concentric LVH. Normal EF.  Abnormal relaxation, aortic sclerosis with mild-mod AI  . Chronic atrial fibrillation (HCC) 12/15/2012  . Long term (current) use of anticoagulants 12/15/2012    warfarin  . Essential hypertension 05/23/2013  . Dyslipidemia, goal LDL below 70[272.4]      - on statin, followed by primary physician.   . Glaucoma   . History of kidney stones   . H/O seasonal allergies   . Diverticulitis   . C4 cervical fracture (HCC) 2015    with spinal cord contusion - stopped coumadin.  . Acute right MCA stroke (HCC) 09/23/2015    Past Surgical History  Procedure Laterality Date  . Coronary artery bypass graft  1994    LIMA-LAD, SVG-RI, SVG-OM, SVG-RCA   . Persantine myoview  February 2008    Fixed inferior wall defect-consistent with infarct/scar without peri-infarct ischemia  . Transthoracic echocardiogram  February 2008    Mild/mod conc LVH, Nl Size & Fxn, Gr 1 DD(abnormal relaxation), mild to moderate aortic regurgitation and mildly sclerotic aortic valve.  Marland Kitchen Appendectomy    . Hand surgery Left 1985  . Femur surgery Left 2000    replacement- due to fall   Prior to Admission medications   Medication Sig Start Date End Date Taking? Authorizing Provider  atorvastatin (LIPITOR) 40 MG tablet Take 1 tablet (40 mg total) by mouth daily at 6 PM. 10/07/15  Yes Eustaquio Boyden, MD  carvedilol (COREG) 3.125 MG tablet Take 1 tablet (3.125 mg total) by mouth 2 (two) times daily with a meal.  10/07/15  Yes Eustaquio Boyden, MD  clopidogrel (PLAVIX) 75 MG tablet Take 1 tablet (75 mg total) by mouth daily. 10/07/15  Yes Eustaquio Boyden, MD  latanoprost (XALATAN) 0.005 % ophthalmic solution Place 1 drop into both eyes at bedtime. 04/11/13  Yes Historical Provider, MD  levothyroxine (SYNTHROID) 75 MCG tablet Take 1 tablet (75 mcg total) by mouth daily before breakfast. 10/07/15  Yes Eustaquio Boyden, MD  lisinopril (PRINIVIL,ZESTRIL) 20 MG tablet Take 1 tablet (20 mg total) by mouth daily. 10/07/15  Yes Eustaquio Boyden, MD  Nutritional Supplements (BOOST HIGH PROTEIN PO) Take 1 Bottle by mouth daily as needed (Dietary supplement).   Yes Historical Provider, MD  timolol (TIMOPTIC) 0.5 % ophthalmic solution Place 1 drop into both eyes every morning.  04/11/13  Yes Historical Provider, MD   No Known Allergies  Social History   Social History  . Marital Status: Married    Spouse Name: N/A  . Number of Children: N/A  . Years of Education: N/A   Social History Main Topics  . Smoking status: Never Smoker   . Smokeless tobacco: Never Used  . Alcohol Use: No  . Drug Use: No  . Sexual Activity: Not Asked   Other  Topics Concern  . None   Social History Narrative   Lives with wife and with daughter and her daughter's family (SIL and grandson).   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 He is a grandchildren and one great grand children at present.   He is extremely active and does routinely walk up to 3 times a day for 10-15 minutes at a time. He really is limited by an unstable balance and gait due to his glaucoma and inability to see well.   Family History  Problem Relation Age of Onset  . Liver cancer Mother   . Prostate cancer Father     Wt Readings from Last 3 Encounters:  10/22/15 143 lb (64.864 kg)   10/06/15 144 lb (65.318 kg)  09/24/15 144 lb 3.2 oz (65.409 kg)    PHYSICAL EXAM BP 118/54 mmHg  Pulse 57  Ht  (1.676 m)  Wt 143 lb (64.864 kg)  BMI 23.09 kg/m2 General appearance: alert, cooperative, appears stated age, no distress and Relatively hard of hearing but also in Spanish only speaking. Very pleasant gentleman healthy appearing, and actually looks younger than his stated age. He has normal mood affect and exquisite appropriately in Spanish Neck: no adenopathy, no carotid bruit, no JVD, supple, symmetrical, trachea midline and thyroid not enlarged, symmetric, no tenderness/mass/nodules Lungs: clear to auscultation bilaterally, normal percussion bilaterally and Nonlabored, good air movement Heart: normal apical impulse, irregularly irregular rhythm, S1, S2 normal, no S3 or S4 and 2/6 crescendo/decrescendo SEM at RUSB --> carotids, very subtle possible diastolic murmur heard near the apex. Abdomen: soft, non-tender; bowel sounds normal; no masses, no organomegaly Extremities: extremities normal, atraumatic, no cyanosis or edema, no edema, redness or tenderness in the calves or thighs and no ulcers, gangrene or trophic changes Pulses: 2+ and symmetric Neurologic: Grossly normal; clear thought process, A& Ox3    Adult ECG Report  Rate: 57 ;  Rhythm: atrial fibrillation and controlled Ventricular rate.  Non-specific ST-T abnormalities;   Narrative Interpretation: Stable EKG   Other studies Reviewed: Additional studies/ records that were reviewed today include:  Recent Labs:  Lab Results  Component Value Date   CHOL 122 09/23/2015   HDL 44 09/23/2015   LDLCALC 49 09/23/2015   TRIG 147 09/23/2015   CHOLHDL 2.8 09/23/2015    ASSESSMENT / PLAN: Problem List Items Addressed This Visit    Essential hypertension (Chronic)    Well-controlled on current management.      Relevant Orders   EKG 12-Lead (Completed)   Dyslipidemia, goal LDL below 70 - on statin, followed  by primary physician. (Chronic)    Well-controlled. On moderate dose atorvastatin. Labs reviewed.      Chronic atrial fibrillation (HCC) - Primary (Chronic)    Remains rate controlled on low-dose carvedilol. No signs of hypo-tension or bradycardia. Again I'm not really sure what happened with why he was not restarted on warfarin. He apparently did not have follow-up with neurosurgery after his fall, but yet the decision made during that time has stood for over a year now. Now he at least on Plavix, but would probably better off with warfarin in light of his recent stroke. However he was not started on warfarin while in the hospital. I therefore will defer to neurology, but feel like he should be restarted  on warfarin. Hopefully he will be seeing neurology soon to have his questions answered.      Relevant Orders   EKG 12-Lead (Completed)   CAD in native artery -->  CABG x4 in 1994 (Chronic)    Remained stable with no active symptoms. Is on a standing dose of carvedilol and lisinopril with no further anginal symptoms. Is also on statin with no myalgias.      Relevant Orders   EKG 12-Lead (Completed)   Acute right MCA stroke (HCC)    Seemingly no residual symptoms. Not exactly sure why he was discharged on Plavix and not restarted on warfarin. I think for his long-standing atrial fibrillation, he did very well on warfarin. He had a mechanical fall was not related to any other falls he's ever had. At this point I will defer to neurology as to the timing of when we can restart warfarin. For now continue Plavix.         Current medicines are reviewed at length with the patient today. (+/- concerns) once to know when to go back on Coumadin The following changes have been made:  I will defer to neurology and neurosurgery as to the safety of restarting Coumadin   Studies Ordered:   Orders Placed This Encounter  Procedures  . EKG 12-Lead    FOLLOW-up:  MAY 2017 WITH DR Hansford Hirt --30  MIN    Atianna Haidar, Piedad Climes, M.D., M.S. Interventional Cardiologist   Pager # [phone] Phone # [phone] [address] [place], [place] [address]

## 2015-10-25 ENCOUNTER — Encounter: Payer: Self-pay | Admitting: Cardiology

## 2015-10-25 NOTE — Assessment & Plan Note (Signed)
Remains rate controlled on low-dose carvedilol. No signs of hypo-tension or bradycardia. Again I'm not really sure what happened with why he was not restarted on warfarin. He apparently did not have follow-up with neurosurgery after his fall, but yet the decision made during that time has stood for over a year now. Now he at least on Plavix, but would probably better off with warfarin in light of his recent stroke. However he was not started on warfarin while in the hospital. I therefore will defer to neurology, but feel like he should be restarted on warfarin. Hopefully he will be seeing neurology soon to have his questions answered.

## 2015-10-25 NOTE — Assessment & Plan Note (Signed)
Remained stable with no active symptoms. Is on a standing dose of carvedilol and lisinopril with no further anginal symptoms. Is also on statin with no myalgias.

## 2015-10-25 NOTE — Assessment & Plan Note (Signed)
Well controlled on current management ?

## 2015-10-25 NOTE — Assessment & Plan Note (Signed)
Seemingly no residual symptoms. Not exactly sure why he was discharged on Plavix and not restarted on warfarin. I think for his long-standing atrial fibrillation, he did very well on warfarin. He had a mechanical fall was not related to any other falls he's ever had. At this point I will defer to neurology as to the timing of when we can restart warfarin. For now continue Plavix.

## 2015-10-25 NOTE — Assessment & Plan Note (Signed)
Well-controlled. On moderate dose atorvastatin. Labs reviewed.

## 2015-10-28 DIAGNOSIS — I63512 Cerebral infarction due to unspecified occlusion or stenosis of left middle cerebral artery: Secondary | ICD-10-CM

## 2015-10-28 DIAGNOSIS — Z8673 Personal history of transient ischemic attack (TIA), and cerebral infarction without residual deficits: Secondary | ICD-10-CM | POA: Insufficient documentation

## 2015-10-28 HISTORY — DX: Cerebral infarction due to unspecified occlusion or stenosis of left middle cerebral artery: I63.512

## 2015-10-28 HISTORY — DX: Personal history of transient ischemic attack (TIA), and cerebral infarction without residual deficits: Z86.73

## 2015-11-12 ENCOUNTER — Ambulatory Visit: Payer: PPO | Admitting: Neurology

## 2015-11-12 ENCOUNTER — Ambulatory Visit (INDEPENDENT_AMBULATORY_CARE_PROVIDER_SITE_OTHER): Payer: PPO | Admitting: Neurology

## 2015-11-12 ENCOUNTER — Encounter: Payer: Self-pay | Admitting: Neurology

## 2015-11-12 VITALS — BP 114/68 | HR 70 | Wt 146.0 lb

## 2015-11-12 DIAGNOSIS — I10 Essential (primary) hypertension: Secondary | ICD-10-CM

## 2015-11-12 DIAGNOSIS — I63511 Cerebral infarction due to unspecified occlusion or stenosis of right middle cerebral artery: Secondary | ICD-10-CM | POA: Diagnosis not present

## 2015-11-12 DIAGNOSIS — G4486 Cervicogenic headache: Secondary | ICD-10-CM

## 2015-11-12 DIAGNOSIS — I482 Chronic atrial fibrillation, unspecified: Secondary | ICD-10-CM

## 2015-11-12 DIAGNOSIS — E785 Hyperlipidemia, unspecified: Secondary | ICD-10-CM | POA: Diagnosis not present

## 2015-11-12 DIAGNOSIS — R51 Headache: Secondary | ICD-10-CM

## 2015-11-12 MED ORDER — SIMVASTATIN 20 MG PO TABS: 20.0000 mg | ORAL_TABLET | Freq: Every day | ORAL | Status: DC

## 2015-11-12 NOTE — Addendum Note (Signed)
Addended by: Sheilah MinsFOX, Khamari Sheehan A on: 11/12/2015 11:16 AM   Modules accepted: Orders, Medications

## 2015-11-12 NOTE — Progress Notes (Addendum)
NEUROLOGY CONSULTATION NOTE  [name] MRN: [id number] DOB: October 15, 1916  Referring provider: Dr. Sharen Hones Primary care provider: Dr. Sharen Hones  Reason for consult:  Stroke  HISTORY OF PRESENT ILLNESS: Jacob [name] is a 82 year old right-handed Spanish-speaking male with chronic atrial fibrillation, hypertension, hyperlipidemia, CAD and anemia who presents for recent stroke.  History obtained by patient, his granddaughter, hospital notes and PCP note.  Labs, doppler reports and imaging of CT and MRI of brain reviewed.    He has atrial fibrillation.  In December 2015, he was taken off Coumadin due to a mechanical fall in which he sustained hematoma involving the neck, and had been treated with ASA .  He was hospitalized at [facility] on 09/23/15 after presenting with left arm weakness lasting one hour.  He was asymptomatic in the ED.  CT head revealed atrophy and extensive small vessel ischemic changes but no acute infarct.  He was admitted for further evaluation.  MRI of brain revealed small acute lacunar infarct in the motor strip of the right MCA territory.  A small subacute infarct was noted in the left motor strip as well.  Carotid doppler revealed 1-49% stenosis in both ICAs.  LDL was 49.  It was advised to switch antiplatelet therapy from ASA to Plavix and switch from Zocor  to Lipitor .  He has not had any recurrent falls since December 2015.  For about 2-3 months, he also notes occasional right occipital pain that radiates into the neck.  He enjoys sewing and often is hunched over for prolonged periods.  PAST MEDICAL HISTORY: Past Medical History  Diagnosis Date  . CAD in native artery -->  1994    referred for CABG  . S/P CABG x 4 1994    a) 1994: LIMA-LAD, SVG-RI, SVG-OM, SVG-RCA; b) 09/2006, Persantine Myoview: fixed inferior defect consistent with infarct/scar without peri-infarct ischemia;; 2-D echo: Mild-mod concentric LVH. Normal EF.  Abnormal relaxation, aortic  sclerosis with mild-mod AI  . Chronic atrial fibrillation (HCC) 12/15/2012  . Long term (current) use of anticoagulants 12/15/2012    warfarin  . Essential hypertension 05/23/2013  . Dyslipidemia, goal LDL below 70[272.4]      - on statin, followed by primary physician.   . Glaucoma   . History of kidney stones   . H/O seasonal allergies   . Diverticulitis   . C4 cervical fracture (HCC) 2015    with spinal cord contusion - stopped coumadin.  . Acute right MCA stroke (HCC) 09/23/2015    PAST SURGICAL HISTORY: Past Surgical History  Procedure Laterality Date  . Coronary artery bypass graft  1994    LIMA-LAD, SVG-RI, SVG-OM, SVG-RCA   . Persantine myoview  February 2008    Fixed inferior wall defect-consistent with infarct/scar without peri-infarct ischemia  . Transthoracic echocardiogram  February 2008    Mild/mod conc LVH, Nl Size & Fxn, Gr 1 DD(abnormal relaxation), mild to moderate aortic regurgitation and mildly sclerotic aortic valve.  Marland Kitchen Appendectomy    . Hand surgery Left 1985  . Femur surgery Left 2000    replacement- due to fall    MEDICATIONS: Current Outpatient Prescriptions on File Prior to Visit  Medication Sig Dispense Refill  . atorvastatin (LIPITOR) 40 MG tablet Take 1 tablet (40 mg total) by mouth daily at 6 PM. 30 tablet 6  . carvedilol (COREG) 3.125 MG tablet Take 1 tablet (3.125 mg total) by mouth 2 (two) times daily with a meal. 60 tablet 6  . clopidogrel (  PLAVIX) 75 MG tablet Take 1 tablet (75 mg total) by mouth daily. 30 tablet 6  . latanoprost (XALATAN) 0.005 % ophthalmic solution Place 1 drop into both eyes at bedtime.    Marland Kitchen. levothyroxine (SYNTHROID) 75 MCG tablet Take 1 tablet (75 mcg total) by mouth daily before breakfast. 30 tablet 6  . lisinopril (PRINIVIL,ZESTRIL) 20 MG tablet Take 1 tablet (20 mg total) by mouth daily. 30 tablet 6  . Nutritional Supplements (BOOST HIGH PROTEIN PO) Take 1 Bottle by mouth daily as needed (Dietary supplement).    . timolol  (TIMOPTIC) 0.5 % ophthalmic solution Place 1 drop into both eyes every morning.      No current facility-administered medications on file prior to visit.    ALLERGIES: No Known Allergies  FAMILY HISTORY: Family History  Problem Relation Age of Onset  . Liver cancer Mother   . Prostate cancer Father     SOCIAL HISTORY: Social History   Social History  . Marital Status: Married    Spouse Name: N/A  . Number of Children: N/A  . Years of Education: N/A   Occupational History  . Not on file.   Social History Main Topics  . Smoking status: Never Smoker   . Smokeless tobacco: Never Used  . Alcohol Use: No  . Drug Use: No  . Sexual Activity: Not on file   Other Topics Concern  . Not on file   Social History Narrative   Lives with wife and with daughter and her daughter's family (SIL and grandson).   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 He is a grandchildren and one great grand children at present.   He is extremely active and does routinely walk up to 3 times a day for 10-15 minutes at a time. He really is limited by an unstable balance and gait due to his glaucoma and inability to see well.    REVIEW OF SYSTEMS: Constitutional: No fevers, chills, or sweats, no generalized fatigue, change in appetite Eyes: No visual changes, double vision, eye pain Ear, nose and throat: No hearing loss, ear pain, nasal congestion, sore throat Cardiovascular: No chest pain, palpitations Respiratory:  No shortness of breath at rest or with exertion, wheezes GastrointestinaI: No nausea, vomiting, diarrhea, abdominal pain, fecal incontinence Genitourinary:  No dysuria, urinary retention or frequency Musculoskeletal:  No neck pain, back pain Integumentary: No rash, pruritus, skin lesions Neurological: as  above Psychiatric: No depression, insomnia, anxiety Endocrine: No palpitations, fatigue, diaphoresis, mood swings, change in appetite, change in weight, increased thirst Hematologic/Lymphatic:  No anemia, purpura, petechiae. Allergic/Immunologic: no itchy/runny eyes, nasal congestion, recent allergic reactions, rashes  PHYSICAL EXAM: Filed Vitals:   11/12/15 0950  BP: 114/68  Pulse: 70   General: No acute distress.  Patient appears well-groomed.  Head:  Normocephalic/atraumatic Eyes:  fundi examined but not visualized Neck: supple, no paraspinal tenderness, full range of motion Back: No paraspinal tenderness Heart: regular rate and rhythm Lungs: Clear to auscultation bilaterally. Vascular: No carotid bruits. Neurological Exam: Mental status: alert and oriented to person, place, and time, recent and remote memory intact, fund of knowledge intact, attention and concentration intact, speech fluent and not dysarthric, language intact. Cranial nerves: CN I: not tested CN II: bilateral surgical pupils, left mildly reactive to light, visual fields intact CN III, IV,  VI:  full range of motion, no nystagmus, no ptosis CN V: facial sensation intact CN VII: upper and lower face symmetric CN VIII: hearing intact CN IX, X: gag intact, uvula midline CN XI: sternocleidomastoid and trapezius muscles intact CN XII: tongue midline Bulk & Tone: normal, no fasciculations. Motor:  5/5 throughout  Sensation: temperature and vibration sensation intact. Deep Tendon Reflexes:  2+ throughout except absent in ankles, toes downgoing.  Finger to nose testing:  Without dysmetria.  Heel to shin:  Without dysmetria.  Gait:  Normal station and stride.  Able to turn.  Romberg negative.  IMPRESSION: Right MCA territory acute ischemic infarct Atrial fibrillation Hypertension Hyperlipidemia Cervicogenic headache  The question is whether he should remain on antiplatelet therapy or return to  anti-coagulation.  He has a CHADS2 score of 4, which suggests he is at high-risk for thrombo-embolic event.  He has a HAS-BLED score of either 4 or 5 points, which both indicate high risk of bleeding on anticoagulation.  The risks and benefits must be weighed, but it is difficult to give a definite answer, especially since he has had only one stroke and one fall.  Given his age and history of bleeding while on anticoagulation, I would favor remaining on antiplatelet therapy.   PLAN: 1.  Remain on Plavix for secondary stroke prevention 2.  I would favor switching from Lipitor back to Zocor  daily.  He had a low LDL already and I wouldn't want it too low.  We will recheck fasting lipid panel in 4 months prior to follow up.  LDL goal less than 70. 3.  Tylenol as needed for posterior headache.  Recommended not to be performing activities with head down for prolonged periods (such as sewing) 4.  Continue blood pressure control 5.  Follow up in 4 months.  Thank you for allowing me to take part in the care of this patient.  Shon Millet, DO  CC:  Eustaquio Boyden, MD  Bryan Lemma, MD

## 2015-11-12 NOTE — Patient Instructions (Addendum)
1.  He is high risk for both stroke and for bleeding on anticoagulation.  You will have to weigh the risks and benefits.  In meantime, continue plavix 2.  For headache, recommend not sewing as his neck is flexed for long periods of time.  Take acetaminophen. 3.  Switch back to Zocor 20mg  daily 4.  Follow up in 4 months

## 2015-11-20 ENCOUNTER — Inpatient Hospital Stay (HOSPITAL_COMMUNITY)
Admission: EM | Admit: 2015-11-20 | Discharge: 2015-11-24 | DRG: 064 | Disposition: A | Discharge status: Home Health | Payer: PPO | Attending: Internal Medicine | Admitting: Internal Medicine

## 2015-11-20 ENCOUNTER — Emergency Department (HOSPITAL_COMMUNITY): Payer: PPO

## 2015-11-20 ENCOUNTER — Observation Stay (HOSPITAL_COMMUNITY): Payer: PPO

## 2015-11-20 ENCOUNTER — Encounter (HOSPITAL_COMMUNITY): Payer: Self-pay | Admitting: Emergency Medicine

## 2015-11-20 DIAGNOSIS — Z79899 Other long term (current) drug therapy: Secondary | ICD-10-CM

## 2015-11-20 DIAGNOSIS — I63412 Cerebral infarction due to embolism of left middle cerebral artery: Secondary | ICD-10-CM | POA: Diagnosis not present

## 2015-11-20 DIAGNOSIS — I63511 Cerebral infarction due to unspecified occlusion or stenosis of right middle cerebral artery: Secondary | ICD-10-CM | POA: Diagnosis not present

## 2015-11-20 DIAGNOSIS — G9340 Encephalopathy, unspecified: Secondary | ICD-10-CM | POA: Diagnosis present

## 2015-11-20 DIAGNOSIS — I6789 Other cerebrovascular disease: Secondary | ICD-10-CM | POA: Diagnosis not present

## 2015-11-20 DIAGNOSIS — R1310 Dysphagia, unspecified: Secondary | ICD-10-CM | POA: Diagnosis present

## 2015-11-20 DIAGNOSIS — I160 Hypertensive urgency: Secondary | ICD-10-CM | POA: Diagnosis present

## 2015-11-20 DIAGNOSIS — I10 Essential (primary) hypertension: Secondary | ICD-10-CM | POA: Diagnosis not present

## 2015-11-20 DIAGNOSIS — Z7902 Long term (current) use of antithrombotics/antiplatelets: Secondary | ICD-10-CM | POA: Diagnosis not present

## 2015-11-20 DIAGNOSIS — I63513 Cerebral infarction due to unspecified occlusion or stenosis of bilateral middle cerebral arteries: Secondary | ICD-10-CM | POA: Diagnosis not present

## 2015-11-20 DIAGNOSIS — N183 Chronic kidney disease, stage 3 (moderate): Secondary | ICD-10-CM | POA: Diagnosis present

## 2015-11-20 DIAGNOSIS — I2510 Atherosclerotic heart disease of native coronary artery without angina pectoris: Secondary | ICD-10-CM | POA: Diagnosis present

## 2015-11-20 DIAGNOSIS — E785 Hyperlipidemia, unspecified: Secondary | ICD-10-CM | POA: Diagnosis not present

## 2015-11-20 DIAGNOSIS — Z7901 Long term (current) use of anticoagulants: Secondary | ICD-10-CM | POA: Diagnosis not present

## 2015-11-20 DIAGNOSIS — R001 Bradycardia, unspecified: Secondary | ICD-10-CM | POA: Diagnosis not present

## 2015-11-20 DIAGNOSIS — E1165 Type 2 diabetes mellitus with hyperglycemia: Secondary | ICD-10-CM | POA: Diagnosis present

## 2015-11-20 DIAGNOSIS — Z951 Presence of aortocoronary bypass graft: Secondary | ICD-10-CM | POA: Diagnosis not present

## 2015-11-20 DIAGNOSIS — I6523 Occlusion and stenosis of bilateral carotid arteries: Secondary | ICD-10-CM | POA: Diagnosis not present

## 2015-11-20 DIAGNOSIS — I129 Hypertensive chronic kidney disease with stage 1 through stage 4 chronic kidney disease, or unspecified chronic kidney disease: Secondary | ICD-10-CM | POA: Diagnosis present

## 2015-11-20 DIAGNOSIS — E1122 Type 2 diabetes mellitus with diabetic chronic kidney disease: Secondary | ICD-10-CM | POA: Diagnosis present

## 2015-11-20 DIAGNOSIS — I482 Chronic atrial fibrillation, unspecified: Secondary | ICD-10-CM | POA: Diagnosis present

## 2015-11-20 DIAGNOSIS — I639 Cerebral infarction, unspecified: Secondary | ICD-10-CM | POA: Diagnosis not present

## 2015-11-20 DIAGNOSIS — E039 Hypothyroidism, unspecified: Secondary | ICD-10-CM | POA: Diagnosis present

## 2015-11-20 DIAGNOSIS — Z8673 Personal history of transient ischemic attack (TIA), and cerebral infarction without residual deficits: Secondary | ICD-10-CM | POA: Diagnosis not present

## 2015-11-20 DIAGNOSIS — H409 Unspecified glaucoma: Secondary | ICD-10-CM | POA: Diagnosis present

## 2015-11-20 DIAGNOSIS — R410 Disorientation, unspecified: Secondary | ICD-10-CM | POA: Diagnosis not present

## 2015-11-20 DIAGNOSIS — I495 Sick sinus syndrome: Secondary | ICD-10-CM | POA: Diagnosis not present

## 2015-11-20 DIAGNOSIS — F4489 Other dissociative and conversion disorders: Secondary | ICD-10-CM | POA: Diagnosis not present

## 2015-11-20 DIAGNOSIS — D649 Anemia, unspecified: Secondary | ICD-10-CM | POA: Diagnosis present

## 2015-11-20 DIAGNOSIS — J9811 Atelectasis: Secondary | ICD-10-CM | POA: Diagnosis not present

## 2015-11-20 LAB — URINALYSIS, ROUTINE W REFLEX MICROSCOPIC
BILIRUBIN URINE: NEGATIVE
GLUCOSE, UA: NEGATIVE mg/dL
KETONES UR: NEGATIVE mg/dL
NITRITE: NEGATIVE
PH: 5.5 (ref 5.0–8.0)
Protein, ur: NEGATIVE mg/dL
SPECIFIC GRAVITY, URINE: 1.014 (ref 1.005–1.030)

## 2015-11-20 LAB — CBC
HCT: 30.5 % — ABNORMAL LOW (ref 39.0–52.0)
HEMATOCRIT: 32 % — AB (ref 39.0–52.0)
HEMOGLOBIN: 10.5 g/dL — AB (ref 13.0–17.0)
Hemoglobin: 10.4 g/dL — ABNORMAL LOW (ref 13.0–17.0)
MCH: 30.7 pg (ref 26.0–34.0)
MCH: 31.9 pg (ref 26.0–34.0)
MCHC: 32.8 g/dL (ref 30.0–36.0)
MCHC: 34.1 g/dL (ref 30.0–36.0)
MCV: 93.6 fL (ref 78.0–100.0)
MCV: 93.6 fL (ref 78.0–100.0)
Platelets: 257 10*3/uL (ref 150–400)
Platelets: 238 10*3/uL (ref 150–400)
RBC: 3.42 MIL/uL — AB (ref 4.22–5.81)
RBC: 3.26 MIL/uL — ABNORMAL LOW (ref 4.22–5.81)
RDW: 13.8 % (ref 11.5–15.5)
RDW: 13.7 % (ref 11.5–15.5)
WBC: 6.8 10*3/uL (ref 4.0–10.5)
WBC: 7 10*3/uL (ref 4.0–10.5)

## 2015-11-20 LAB — BASIC METABOLIC PANEL
Anion gap: 8 (ref 5–15)
BUN: 26 mg/dL — AB (ref 6–20)
CHLORIDE: 110 mmol/L (ref 101–111)
CO2: 25 mmol/L (ref 22–32)
CREATININE: 1.46 mg/dL — AB (ref 0.61–1.24)
Calcium: 9.2 mg/dL (ref 8.9–10.3)
GFR calc non Af Amer: 38 mL/min — ABNORMAL LOW (ref >60)
GFR, EST AFRICAN AMERICAN: 45 mL/min — AB (ref >60)
GLUCOSE: 96 mg/dL (ref 65–99)
Potassium: 4 mmol/L (ref 3.5–5.1)
Sodium: 143 mmol/L (ref 135–145)

## 2015-11-20 LAB — CREATININE, SERUM
CREATININE: 1.47 mg/dL — AB (ref 0.61–1.24)
GFR calc Af Amer: 44 mL/min — ABNORMAL LOW (ref >60)
GFR, EST NON AFRICAN AMERICAN: 38 mL/min — AB (ref >60)

## 2015-11-20 LAB — URINE MICROSCOPIC-ADD ON

## 2015-11-20 LAB — I-STAT TROPONIN, ED: TROPONIN I, POC: 0.03 ng/mL (ref 0.00–0.08)

## 2015-11-20 LAB — AMMONIA: Ammonia: 36 umol/L — ABNORMAL HIGH (ref 9–35)

## 2015-11-20 MED ORDER — SODIUM CHLORIDE 0.9 % IV SOLN
INTRAVENOUS | Status: DC
  Administered 2015-11-20 (×2): via INTRAVENOUS

## 2015-11-20 MED ORDER — SIMVASTATIN 20 MG PO TABS
20.0000 mg | ORAL_TABLET | Freq: Every day | ORAL | Status: DC
  Administered 2015-11-20 – 2015-11-23 (×4): 20 mg via ORAL
  Filled 2015-11-20 (×4): qty 1

## 2015-11-20 MED ORDER — LISINOPRIL 20 MG PO TABS
20.0000 mg | ORAL_TABLET | Freq: Every day | ORAL | Status: DC
  Administered 2015-11-21: 20 mg via ORAL
  Filled 2015-11-20 (×2): qty 1

## 2015-11-20 MED ORDER — ACETAMINOPHEN 650 MG RE SUPP: 650.0000 mg | Freq: Four times a day (QID) | RECTAL | Status: DC | PRN

## 2015-11-20 MED ORDER — LEVOTHYROXINE SODIUM 75 MCG PO TABS
75.0000 ug | ORAL_TABLET | Freq: Every day | ORAL | Status: DC
  Administered 2015-11-21 – 2015-11-24 (×4): 75 ug via ORAL
  Filled 2015-11-20 (×4): qty 1

## 2015-11-20 MED ORDER — HEPARIN SODIUM (PORCINE) 5000 UNIT/ML IJ SOLN
5000.0000 [IU] | Freq: Three times a day (TID) | INTRAMUSCULAR | Status: DC
  Administered 2015-11-20 – 2015-11-23 (×9): 5000 [IU] via SUBCUTANEOUS
  Filled 2015-11-20 (×9): qty 1

## 2015-11-20 MED ORDER — MORPHINE SULFATE (PF) 2 MG/ML IV SOLN: 1.0000 mg | INTRAVENOUS | Status: DC | PRN

## 2015-11-20 MED ORDER — HYDRALAZINE HCL 20 MG/ML IJ SOLN: 5.0000 mg | Freq: Three times a day (TID) | INTRAMUSCULAR | Status: DC | PRN

## 2015-11-20 MED ORDER — CARVEDILOL 3.125 MG PO TABS
3.1250 mg | ORAL_TABLET | Freq: Two times a day (BID) | ORAL | Status: DC
  Administered 2015-11-20 – 2015-11-22 (×5): 3.125 mg via ORAL
  Filled 2015-11-20 (×5): qty 1

## 2015-11-20 MED ORDER — HYDROCODONE-ACETAMINOPHEN 5-325 MG PO TABS: 1.0000 | ORAL_TABLET | ORAL | Status: DC | PRN

## 2015-11-20 MED ORDER — CLOPIDOGREL BISULFATE 75 MG PO TABS
75.0000 mg | ORAL_TABLET | Freq: Every day | ORAL | Status: DC
  Administered 2015-11-21 – 2015-11-23 (×3): 75 mg via ORAL
  Filled 2015-11-20 (×4): qty 1

## 2015-11-20 MED ORDER — LATANOPROST 0.005 % OP SOLN
1.0000 [drp] | Freq: Every day | OPHTHALMIC | Status: DC
  Administered 2015-11-20 – 2015-11-23 (×4): 1 [drp] via OPHTHALMIC
  Filled 2015-11-20: qty 2.5

## 2015-11-20 MED ORDER — ACETAMINOPHEN 325 MG PO TABS
650.0000 mg | ORAL_TABLET | Freq: Four times a day (QID) | ORAL | Status: DC | PRN
Start: 2015-11-20 — End: 2015-11-24

## 2015-11-20 MED ORDER — TIMOLOL MALEATE 0.5 % OP SOLN
1.0000 [drp] | Freq: Every evening | OPHTHALMIC | Status: DC
  Administered 2015-11-20 – 2015-11-21 (×2): 1 [drp] via OPHTHALMIC
  Filled 2015-11-20: qty 5

## 2015-11-20 NOTE — Consult Note (Addendum)
Admission H&P    Chief Complaint: Transient episode of confusion.  HPI: Jacob [name] is an 82 y.o. male with a history of stroke in January 2017, atrial fibrillation, hypertension and coronary artery disease, brought to the emergency room following an episode of confusion. Patient reportedly was using atenolol to clean his hands repetitively without interruption. He did not respond to questions from family members. No facial droop was noted. No weakness of extremities was noted. He was not recently given new medication. No tonic or clonic activity was reported. Patient was still somewhat confused when he arrived in the emergency room. His confusion improved following arrival. MRI of his brain showed acute anterior division left MCA infarction as well as superimposed small acute lacunar infarction in posterior MCA territory.  LKW: 7:40 AM on 11/20/2015 TPA given: No: Recent stroke in January 2017  Past Medical History  Diagnosis Date  . CAD in native artery -->  1994    referred for CABG  . S/P CABG x 4 1994    a) 1994: LIMA-LAD, SVG-RI, SVG-OM, SVG-RCA; b) 09/2006, Persantine Myoview: fixed inferior defect consistent with infarct/scar without peri-infarct ischemia;; 2-D echo: Mild-mod concentric LVH. Normal EF.  Abnormal relaxation, aortic sclerosis with mild-mod AI  . Chronic atrial fibrillation ([place]) 12/15/2012  . Long term (current) use of anticoagulants 12/15/2012    warfarin  . Essential hypertension 05/23/2013  . Dyslipidemia, goal LDL below 70[272.4]      - on statin, followed by primary physician.   . Glaucoma   . History of kidney stones   . H/O seasonal allergies   . Diverticulitis   . C4 cervical fracture ([place]) 2015    with spinal cord contusion - stopped coumadin.  . Acute right MCA stroke ([place]) 09/23/2015    Past Surgical History  Procedure Laterality Date  . Coronary artery bypass graft  1994    LIMA-LAD, SVG-RI, SVG-OM, SVG-RCA   . Persantine myoview  February 2008     Fixed inferior wall defect-consistent with infarct/scar without peri-infarct ischemia  . Transthoracic echocardiogram  February 2008    Mild/mod conc LVH, Nl Size & Fxn, Gr 1 DD(abnormal relaxation), mild to moderate aortic regurgitation and mildly sclerotic aortic valve.  Marland Kitchen Appendectomy    . Hand surgery Left 1985  . Femur surgery Left 2000    replacement- due to fall    Family History  Problem Relation Age of Onset  . Liver cancer Mother   . Prostate cancer Father    Social History:  reports that he has never smoked. He has never used smokeless tobacco. He reports that he does not drink alcohol or use illicit drugs.  Allergies: No Known Allergies  Medications Prior to Admission  Medication Sig Dispense Refill  . carvedilol (COREG) 3.125 MG tablet Take 1 tablet (3.125 mg total) by mouth 2 (two) times daily with a meal. 60 tablet 6  . clopidogrel (PLAVIX) 75 MG tablet Take 1 tablet (75 mg total) by mouth daily. 30 tablet 6  . latanoprost (XALATAN) 0.005 % ophthalmic solution Place 1 drop into both eyes at bedtime.    Marland Kitchen levothyroxine (SYNTHROID) 75 MCG tablet Take 1 tablet (75 mcg total) by mouth daily before breakfast. 30 tablet 6  . lisinopril (PRINIVIL,ZESTRIL) 20 MG tablet Take 1 tablet (20 mg total) by mouth daily. 30 tablet 6  . Nutritional Supplements (BOOST HIGH PROTEIN PO) Take 1 Bottle by mouth daily as needed (Dietary supplement).    . simvastatin (ZOCOR) 20 MG tablet  Take 1 tablet (20 mg total) by mouth daily. 90 tablet 1  . timolol (TIMOPTIC) 0.5 % ophthalmic solution Place 1 drop into both eyes every evening.       ROS: History obtained from spouse and daughter.  General ROS: negative for - chills, fatigue, fever, night sweats, weight gain or weight loss Psychological ROS: negative for - behavioral disorder, hallucinations, memory difficulties, mood swings or suicidal ideation Ophthalmic ROS: negative for - blurry vision, double vision, eye pain or loss of vision ENT  ROS: negative for - epistaxis, nasal discharge, oral lesions, sore throat, tinnitus or vertigo Allergy and Immunology ROS: negative for - hives or itchy/watery eyes Hematological and Lymphatic ROS: negative for - bleeding problems, bruising or swollen lymph nodes Endocrine ROS: negative for - galactorrhea, hair pattern changes, polydipsia/polyuria or temperature intolerance Respiratory ROS: negative for - cough, hemoptysis, shortness of breath or wheezing Cardiovascular ROS: negative for - chest pain, dyspnea on exertion, edema or irregular heartbeat Gastrointestinal ROS: negative for - abdominal pain, diarrhea, hematemesis, nausea/vomiting or stool incontinence Genito-Urinary ROS: negative for - dysuria, hematuria, incontinence or urinary frequency/urgency Musculoskeletal ROS: negative for - joint swelling or muscular weakness Neurological ROS: as noted in HPI Dermatological ROS: negative for rash and skin lesion changes  Physical Examination: Blood pressure 171/80, pulse 73, temperature 97.9 F (36.6 C), temperature source Oral, resp. rate 20, height _0  (1.702 m), weight 63.594 kg (140 lb 3.2 oz), SpO2 100 %.  HEENT-  Normocephalic, no lesions, without obvious abnormality.  Normal external eye and conjunctiva.  Normal TM's bilaterally.  Normal auditory canals and external ears. Normal external nose, mucus membranes and septum.  Normal pharynx. Neck supple with no masses, nodes, nodules or enlargement. Cardiovascular - irregularly irregular rhythm, S1, S2 normal and no S3 or S4 Lungs - chest clear, no wheezing, rales, normal symmetric air entry Abdomen - soft, non-tender; bowel sounds normal; no masses,  no organomegaly Extremities - no edema and no skin discoloration  Neurologic Examination: Mental Status: Alert, oriented, no acute distress.  Able to follow commands. Cranial Nerves: II-Visual fields were normal. III/IV/VI-right pupil was dilated and did not react to light. Left pupil  was normal in size and reacted normally to light. Extraocular movements were full and conjugate.    V/VII-no facial numbness and no facial weakness. VIII-normal. X-no apparent dysarthria. XI: trapezius strength/neck flexion strength normal bilaterally XII-midline tongue extension with normal strength. Motor: 5/5 bilaterally with normal tone and bulk Sensory: Normal throughout. Deep Tendon Reflexes: 2+ and symmetric. Plantars: Mute bilaterally Cerebellar: Normal elevation of upper extremities. Carotid auscultation: Normal  Results for orders placed or performed during the hospital encounter of 11/20/15 (from the past 48 hour(s))  CBC     Status: Abnormal   Collection Time: 11/20/15 10:08 AM  Result Value Ref Range   WBC 6.8 4.0 - 10.5 K/uL   RBC 3.42 (L) 4.22 - 5.81 MIL/uL   Hemoglobin 10.5 (L) 13.0 - 17.0 g/dL   HCT 32.0 (L) 39.0 - 52.0 %   MCV 93.6 78.0 - 100.0 fL   MCH 30.7 26.0 - 34.0 pg   MCHC 32.8 30.0 - 36.0 g/dL   RDW 13.8 11.5 - 15.5 %   Platelets 257 150 - 400 K/uL  Basic metabolic panel     Status: Abnormal   Collection Time: 11/20/15 10:08 AM  Result Value Ref Range   Sodium 143 135 - 145 mmol/L   Potassium 4.0 3.5 - 5.1 mmol/L   Chloride 110 101 -  111 mmol/L   CO2 25 22 - 32 mmol/L   Glucose, Bld 96 65 - 99 mg/dL   BUN 26 (H) 6 - 20 mg/dL   Creatinine, Ser 1.46 (H) 0.61 - 1.24 mg/dL   Calcium 9.2 8.9 - 10.3 mg/dL   GFR calc non Af Amer 38 (L) >60 mL/min   GFR calc Af Amer 45 (L) >60 mL/min    Comment: (NOTE) The eGFR has been calculated using the CKD EPI equation. This calculation has not been validated in all clinical situations. eGFR's persistently <60 mL/min signify possible Chronic Kidney Disease.    Anion gap 8 5 - 15  I-Stat Troponin, ED (not at [facility])     Status: None   Collection Time: 11/20/15 10:15 AM  Result Value Ref Range   Troponin i, poc 0.03 0.00 - 0.08 ng/mL   Comment 3            Comment: Due to the release kinetics of cTnI, a negative  result within the first hours of the onset of symptoms does not rule out myocardial infarction with certainty. If myocardial infarction is still suspected, repeat the test at appropriate intervals.   Urinalysis, Routine w reflex microscopic (not at [facility])     Status: Abnormal   Collection Time: 11/20/15 11:00 AM  Result Value Ref Range   Color, Urine YELLOW YELLOW   APPearance CLEAR CLEAR   Specific Gravity, Urine 1.014 1.005 - 1.030   pH 5.5 5.0 - 8.0   Glucose, UA NEGATIVE NEGATIVE mg/dL   Hgb urine dipstick TRACE (A) NEGATIVE   Bilirubin Urine NEGATIVE NEGATIVE   Ketones, ur NEGATIVE NEGATIVE mg/dL   Protein, ur NEGATIVE NEGATIVE mg/dL   Nitrite NEGATIVE NEGATIVE   Leukocytes, UA TRACE (A) NEGATIVE  Urine microscopic-add on     Status: Abnormal   Collection Time: 11/20/15 11:00 AM  Result Value Ref Range   Squamous Epithelial / LPF 0-5 (A) NONE SEEN   WBC, UA 0-5 0 - 5 WBC/hpf   RBC / HPF 0-5 0 - 5 RBC/hpf   Bacteria, UA RARE (A) NONE SEEN  CBC     Status: Abnormal   Collection Time: 11/20/15  2:08 PM  Result Value Ref Range   WBC 7.0 4.0 - 10.5 K/uL   RBC 3.26 (L) 4.22 - 5.81 MIL/uL   Hemoglobin 10.4 (L) 13.0 - 17.0 g/dL   HCT 30.5 (L) 39.0 - 52.0 %   MCV 93.6 78.0 - 100.0 fL   MCH 31.9 26.0 - 34.0 pg   MCHC 34.1 30.0 - 36.0 g/dL   RDW 13.7 11.5 - 15.5 %   Platelets 238 150 - 400 K/uL  Creatinine, serum     Status: Abnormal   Collection Time: 11/20/15  2:08 PM  Result Value Ref Range   Creatinine, Ser 1.47 (H) 0.61 - 1.24 mg/dL   GFR calc non Af Amer 38 (L) >60 mL/min   GFR calc Af Amer 44 (L) >60 mL/min    Comment: (NOTE) The eGFR has been calculated using the CKD EPI equation. This calculation has not been validated in all clinical situations. eGFR's persistently <60 mL/min signify possible Chronic Kidney Disease.   Ammonia     Status: Abnormal   Collection Time: 11/20/15  2:08 PM  Result Value Ref Range   Ammonia 36 (H) 9 - 35 umol/L   Dg Chest 2  View  11/20/2015  CLINICAL DATA:  Confusion.  Hypertension EXAM: CHEST  2 VIEW COMPARISON:  April 12, 2007 FINDINGS: There is mild bibasilar atelectatic change. There is no edema or consolidation. Heart is mildly enlarged with pulmonary vascularity within normal limits. Patient is status post internal mammary bypass grafting. There is degenerative change in the thoracic spine. Bones are osteoporotic. IMPRESSION: Mild bibasilar atelectasis. No edema or consolidation. Mild cardiac enlargement. Bones are osteoporotic. Electronically Signed   By: Lowella Grip III M.D.   On: 11/20/2015 10:26   Ct Head Wo Contrast  11/20/2015  CLINICAL DATA:  Confusion and altered mental status beginning this morning. Stroke in January. EXAM: CT HEAD WITHOUT CONTRAST TECHNIQUE: Contiguous axial images were obtained from the base of the skull through the vertex without intravenous contrast. COMPARISON:  MRI 09/24/2015.  CT 09/23/2015. FINDINGS: The brain shows generalized atrophy. There chronic small-vessel ischemic changes affecting the hemispheric white matter, basal ganglia and thalami. No sign of acute infarction, mass lesion, hemorrhage, hydrocephalus or extra-axial collection. Sinuses, middle ears and mastoids are clear. There is atherosclerotic calcification of the major vessels at the base of the brain. IMPRESSION: No acute finding by CT. Atrophy and chronic small vessel ischemic changes. Electronically Signed   By: Nelson Chimes M.D.   On: 11/20/2015 11:47   Mr Brain Wo Contrast  11/20/2015  CLINICAL DATA:  82 year old male with confusion since 0740 hours today. Initial encounter. Right MCA lacunar infarct in January. EXAM: MRI HEAD WITHOUT CONTRAST TECHNIQUE: Multiplanar, multiecho pulse sequences of the brain and surrounding structures were obtained without intravenous contrast. COMPARISON:  Head CT without contrast 1144 hours today. Brain MRI 09/24/2015. FINDINGS: Major intracranial vascular flow voids are stable.  However, there is restricted diffusion involving the anterior left frontal lobe, epicenter at the left middle frontal gyrus. 4 cm area of cortex and subcortical white matter affected. Mild cytotoxic edema with no associated hemorrhage or mass effect. Superimposed 3-4 mm acute cortical restricted diffusion in the superior left parietal lobe abutting the sensory strip (series 4, image 42). Expected resolution of the small areas of restricted diffusion seen in January. No other restricted diffusion. Elsewhere stable gray and white matter signal since January. No midline shift, mass effect, evidence of mass lesion, ventriculomegaly, extra-axial collection or acute intracranial hemorrhage. Cervicomedullary junction and pituitary are within normal limits. Stable paranasal sinuses, mastoids, orbits soft tissues and scalp soft tissues. Normal bone marrow signal. Stable visualized cervical spine. IMPRESSION: 1. Acute anterior division left MCA infarct. No associated mass effect or hemorrhage. 2. Superimposed small acute lacunar infarct in the posterior MCA territory, near the superior sensory strip. 3. Elsewhere stable MRI appearance of the brain since January. Electronically Signed   By: Genevie Ann M.D.   On: 11/20/2015 16:22    Assessment/Plan 82 year old man with multiple risk factors for stroke as well as recent stroke 2 months ago, presenting with recurrent left MCA territory acute ischemic infarction. X  Stroke risk factors: Hypertension, hyperlipidemia, atrial fibrillation  Plan: 1. HgbA1c, fasting lipid panel 2. MRA  of the brain without contrast 3. PT consult, OT consult, Speech consult 4. Prophylactic therapy-Antiplatelet med: Plavix  5. Risk  Factor modification  C.R. Nicole Kindred, [place] [facility] [phone]  11/20/2015, 10:30 PM

## 2015-11-20 NOTE — ED Notes (Signed)
Attempted to call report to [phone] 

## 2015-11-20 NOTE — ED Notes (Signed)
Pt back from CT

## 2015-11-20 NOTE — Progress Notes (Signed)
Pt admitted to the unit at 1606. Pt mental status is Ax1. Pt oriented to room, staff, and call bell. Skin is intact. Full assessment charted in [facility]. Call bell within reach. Visitor guidelines reviewed w/ pt and/or family.

## 2015-11-20 NOTE — ED Provider Notes (Signed)
CSN: [id number]     Arrival date & time 11/20/15  9811 History   First MD Initiated Contact with Patient 11/20/15 0945     Chief Complaint  Patient presents with  . Altered Mental Status   Level 5 Caveat: MS change   HPI TEVION LAFORGE is a 82 y.o. male PMH significant for  a-fib (patient endorses being compliant on Plavix), CABG 4, hypertension, acute right MCA stroke (08/2015) presenting status post one episode of confusion this morning around approximately 7:40 AM. Last known normal was last night. The patient's daughter, present at bedside, is interpreting for patient as patient is Spanish-speaking. History obtained from her. She states that her father and mother live with her and her mother had informed her that this morning her father was drying his hands repetitively and appeared confused. He was unable to tell her as well as her mother who they were. She states is not baseline for him. He denies fevers, chills, pain.   Past Medical History  Diagnosis Date  . CAD in native artery -->  1994    referred for CABG  . S/P CABG x 4 1994    a) 1994: LIMA-LAD, SVG-RI, SVG-OM, SVG-RCA; b) 09/2006, Persantine Myoview: fixed inferior defect consistent with infarct/scar without peri-infarct ischemia;; 2-D echo: Mild-mod concentric LVH. Normal EF.  Abnormal relaxation, aortic sclerosis with mild-mod AI  . Chronic atrial fibrillation (HCC) 12/15/2012  . Long term (current) use of anticoagulants 12/15/2012    warfarin  . Essential hypertension 05/23/2013  . Dyslipidemia, goal LDL below 70[272.4]      - on statin, followed by primary physician.   . Glaucoma   . History of kidney stones   . H/O seasonal allergies   . Diverticulitis   . C4 cervical fracture (HCC) 2015    with spinal cord contusion - stopped coumadin.  . Acute right MCA stroke (HCC) 09/23/2015   Past Surgical History  Procedure Laterality Date  . Coronary artery bypass graft  1994    LIMA-LAD, SVG-RI, SVG-OM, SVG-RCA   .  Persantine myoview  February 2008    Fixed inferior wall defect-consistent with infarct/scar without peri-infarct ischemia  . Transthoracic echocardiogram  February 2008    Mild/mod conc LVH, Nl Size & Fxn, Gr 1 DD(abnormal relaxation), mild to moderate aortic regurgitation and mildly sclerotic aortic valve.  Marland Kitchen Appendectomy    . Hand surgery Left 1985  . Femur surgery Left 2000    replacement- due to fall   Family History  Problem Relation Age of Onset  . Liver cancer Mother   . Prostate cancer Father    Social History  Substance Use Topics  . Smoking status: Never Smoker   . Smokeless tobacco: Never Used  . Alcohol Use: No    Review of Systems  Unable to perform ROS: Mental status change      Allergies  Review of patient's allergies indicates no known allergies.  Home Medications   Prior to Admission medications   Medication Sig Start Date End Date Taking? Authorizing Provider  carvedilol (COREG) 3.125 MG tablet Take 1 tablet (3.125 mg total) by mouth 2 (two) times daily with a meal. 10/07/15   Eustaquio Boyden, MD  clopidogrel (PLAVIX) 75 MG tablet Take 1 tablet (75 mg total) by mouth daily. 10/07/15   Eustaquio Boyden, MD  latanoprost (XALATAN) 0.005 % ophthalmic solution Place 1 drop into both eyes at bedtime. 04/11/13   Historical Provider, MD  levothyroxine (SYNTHROID) 75 MCG tablet Take 1  tablet (75 mcg total) by mouth daily before breakfast. 10/07/15   Eustaquio BoydenJavier Gutierrez, MD  lisinopril (PRINIVIL,ZESTRIL) 20 MG tablet Take 1 tablet (20 mg total) by mouth daily. 10/07/15   Eustaquio BoydenJavier Gutierrez, MD  Nutritional Supplements (BOOST HIGH PROTEIN PO) Take 1 Bottle by mouth daily as needed (Dietary supplement).    Historical Provider, MD  simvastatin (ZOCOR) 20 MG tablet Take 1 tablet (20 mg total) by mouth daily. 11/12/15   Drema DallasAdam R Jaffe, DO  timolol (TIMOPTIC) 0.5 % ophthalmic solution Place 1 drop into both eyes every morning.  04/11/13   Historical Provider, MD   BP 182/137 mmHg  Pulse  68  Temp(Src) 97.6 F (36.4 C) (Oral)  Resp 18  SpO2 95% Physical Exam  Constitutional: He appears well-developed and well-nourished. No distress.  HENT:  Head: Normocephalic and atraumatic.  Mouth/Throat: Oropharynx is clear and moist. No oropharyngeal exudate.  Eyes: Conjunctivae are normal. Pupils are equal, round, and reactive to light. Right eye exhibits no discharge. Left eye exhibits no discharge. No scleral icterus.  Neck: No tracheal deviation present.  Cardiovascular: Normal rate, regular rhythm, normal heart sounds and intact distal pulses.  Exam reveals no gallop and no friction rub.   No murmur heard. Pulmonary/Chest: Effort normal and breath sounds normal. No respiratory distress. He has no wheezes. He has no rales. He exhibits no tenderness.  Abdominal: Soft. Bowel sounds are normal. He exhibits no distension and no mass. There is no tenderness. There is no rebound and no guarding.  Musculoskeletal: He exhibits no edema.  Lymphadenopathy:    He has no cervical adenopathy.  Neurological: He is alert. Coordination normal.  Oriented to self only.  Follows most strength testing commands. Patient unable to smile, pronator drift, finger to nose due to confusion.   Skin: Skin is warm and dry. No rash noted. He is not diaphoretic. No erythema.  Psychiatric: He has a normal mood and affect. His behavior is normal.  Nursing note and vitals reviewed.   ED Course  Procedures  Labs Review Labs Reviewed  CBC - Abnormal; Notable for the following:    RBC 3.42 (*)    Hemoglobin 10.5 (*)    HCT 32.0 (*)    All other components within normal limits  BASIC METABOLIC PANEL - Abnormal; Notable for the following:    BUN 26 (*)    Creatinine, Ser 1.46 (*)    GFR calc non Af Amer 38 (*)    GFR calc Af Amer 45 (*)    All other components within normal limits  URINALYSIS, ROUTINE W REFLEX MICROSCOPIC (NOT AT [facility]) - Abnormal; Notable for the following:    Hgb urine dipstick TRACE (*)     Leukocytes, UA TRACE (*)    All other components within normal limits  URINE MICROSCOPIC-ADD ON - Abnormal; Notable for the following:    Squamous Epithelial / LPF 0-5 (*)    Bacteria, UA RARE (*)    All other components within normal limits  I-STAT TROPOININ, ED    Imaging Review Dg Chest 2 View  11/20/2015  CLINICAL DATA:  Confusion.  Hypertension EXAM: CHEST  2 VIEW COMPARISON:  April 12, 2007 FINDINGS: There is mild bibasilar atelectatic change. There is no edema or consolidation. Heart is mildly enlarged with pulmonary vascularity within normal limits. Patient is status post internal mammary bypass grafting. There is degenerative change in the thoracic spine. Bones are osteoporotic. IMPRESSION: Mild bibasilar atelectasis. No edema or consolidation. Mild cardiac enlargement. Bones are  osteoporotic. Electronically Signed   By: Bretta Bang III M.D.   On: 11/20/2015 10:26   Ct Head Wo Contrast  11/20/2015  CLINICAL DATA:  Confusion and altered mental status beginning this morning. Stroke in January. EXAM: CT HEAD WITHOUT CONTRAST TECHNIQUE: Contiguous axial images were obtained from the base of the skull through the vertex without intravenous contrast. COMPARISON:  MRI 09/24/2015.  CT 09/23/2015. FINDINGS: The brain shows generalized atrophy. There chronic small-vessel ischemic changes affecting the hemispheric white matter, basal ganglia and thalami. No sign of acute infarction, mass lesion, hemorrhage, hydrocephalus or extra-axial collection. Sinuses, middle ears and mastoids are clear. There is atherosclerotic calcification of the major vessels at the base of the brain. IMPRESSION: No acute finding by CT. Atrophy and chronic small vessel ischemic changes. Electronically Signed   By: Paulina Fusi M.D.   On: 11/20/2015 11:47   I have personally reviewed and evaluated these images and lab results as part of my medical decision-making.   EKG Interpretation   Date/Time:  Friday November 20 2015 09:59:04 EDT Ventricular Rate:  62 PR Interval:    QRS Duration: 138 QT Interval:  438 QTC Calculation: 445 R Axis:   0 Text Interpretation:  Atrial fibrillation Nonspecific intraventricular  conduction delay No significant change since last tracing Confirmed by  KNOTT MD, DANIEL ([id number]) on 11/20/2015 10:36:25 AM      MDM   Final diagnoses:  None  Patient's VSS. Confused from baseline. Broad workup initiated for neuro vs cardiac vs infectious.  CBC, CXR, CT head, EKG, troponin unremarkable for acute change. BMP with slightly elevated SCr from baseline at 1.46 (from 1.2). UA with trace leukocytes and trace hgb.  Patient will need admission for further AMS workup and monitoring.  Melton Krebs, PA-C 11/23/15 6045  Lyndal Pulley, MD 11/24/15 [phone]

## 2015-11-20 NOTE — H&P (Signed)
Jacob [name], is a 82 y.o. male  MRN: [id number]   DOB - 1916/12/20  Admit Date - 11/20/2015  Outpatient Primary MD for the patient is Eustaquio Boyden, MD  Jacob [name]  is a 82 y.o. male  With a history of Afib on Plavix, CAD status post CABG, hypertension, presenting history of acute RMCA stroke in January 2017, presenting with one episode of confusion this morning approximately at 7:40 AM described as repetitive, cleaning his hands with a towel in a coordinated manner, without interruption. No fall was witnessed. No motor or sensory abnormalities.History is obtained by wife and granddaughter, as confusion has not completely resolved. No dysuria, frequency, chest pain, worsening shortness of breath, back pain, rash, fevers, cough, headache, or neck stiffness. No bowel or urinary incontinence with event. No tremors.  No blurred vision or double vision. No new rashes. No ticks or other insect bites. No recent URI type symptoms reported. No ETOH. Last known well last night. No bleeding issues have been reported. Has been compliant with meds, no new meds or herbal supplements. No recent long distance trips. Last physical in 09/2103, without new findings.  At the ED, CT head was negative for acute intracranial abnormalities. CBC and CMET  are at baseline.UA shows rare bacteria. CXR negative. EKG on atrial fibrillation,  no changes since last, with QTc 445  Past Medical History  Diagnosis Date  . CAD in native artery -->  1994    referred for CABG  . S/P CABG x 4 1994    a) 1994: LIMA-LAD, SVG-RI, SVG-OM, SVG-RCA; b) 09/2006, Persantine Myoview: fixed inferior defect consistent with infarct/scar without peri-infarct ischemia;; 2-D echo: Mild-mod concentric LVH. Normal EF.  Abnormal relaxation, aortic sclerosis with mild-mod AI  . Chronic atrial fibrillation (HCC) 12/15/2012  . Long term (current) use of anticoagulants 12/15/2012    warfarin  . Essential hypertension 05/23/2013  . Dyslipidemia,  goal LDL below 70[272.4]      - on statin, followed by primary physician.   . Glaucoma   . History of kidney stones   . H/O seasonal allergies   . Diverticulitis   . C4 cervical fracture (HCC) 2015    with spinal cord contusion - stopped coumadin.  . Acute right MCA stroke (HCC) 09/23/2015      Past Surgical History  Procedure Laterality Date  . Coronary artery bypass graft  1994    LIMA-LAD, SVG-RI, SVG-OM, SVG-RCA   . Persantine myoview  February 2008    Fixed inferior wall defect-consistent with infarct/scar without peri-infarct ischemia  . Transthoracic echocardiogram  February 2008    Mild/mod conc LVH, Nl Size & Fxn, Gr 1 DD(abnormal relaxation), mild to moderate aortic regurgitation and mildly sclerotic aortic valve.  Marland Kitchen Appendectomy    . Hand surgery Left 1985  . Femur surgery Left 2000    replacement- due to fall      Review of Systems    In addition to the HPI above,  No Fever-chills, No Headache, No changes withvision or hearing, Mild swallowing food or Liquids since stroke  No Chest pain, cough or shortness of Breath, No Abdominal pain, No nausea or vomitting, bowel movements are regular, No Blood in stool or Urine, No dysuria,or hematuria No new skin rashes or bruises, No new joints pains-aches,  No new weakness, tingling, numbness in any extremity, No recent weight gain or loss, No polyuria, polydypsia or polyphagia, No significant mental stressors.  A full 10 point Review of Systems was  done, except as stated above, all other Review of Systems were negative.   Social History Social History  Substance Use Topics  . Smoking status: Never Smoker   . Smokeless tobacco: Never Used  . Alcohol Use: No  Originally from [place], 4 ch in good health. Former Corporate investment banker, Patent examiner.    Family History Family History  Problem Relation Age of Onset  . Liver cancer Mother   . Prostate cancer Father      Prior to Admission medications     Medication Sig Start Date End Date Taking? Authorizing Provider  carvedilol (COREG) 3.125 MG tablet Take 1 tablet (3.125 mg total) by mouth 2 (two) times daily with a meal. 10/07/15  Yes Eustaquio Boyden, MD  clopidogrel (PLAVIX) 75 MG tablet Take 1 tablet (75 mg total) by mouth daily. 10/07/15  Yes Eustaquio Boyden, MD  latanoprost (XALATAN) 0.005 % ophthalmic solution Place 1 drop into both eyes at bedtime. 04/11/13  Yes Historical Provider, MD  levothyroxine (SYNTHROID) 75 MCG tablet Take 1 tablet (75 mcg total) by mouth daily before breakfast. 10/07/15  Yes Eustaquio Boyden, MD  lisinopril (PRINIVIL,ZESTRIL) 20 MG tablet Take 1 tablet (20 mg total) by mouth daily. 10/07/15  Yes Eustaquio Boyden, MD  Nutritional Supplements (BOOST HIGH PROTEIN PO) Take 1 Bottle by mouth daily as needed (Dietary supplement).   Yes Historical Provider, MD  simvastatin (ZOCOR) 20 MG tablet Take 1 tablet (20 mg total) by mouth daily. 11/12/15  Yes Adam Mliss Fritz, DO  timolol (TIMOPTIC) 0.5 % ophthalmic solution Place 1 drop into both eyes every evening.  04/11/13  Yes Historical Provider, MD    No Known Allergies  Physical Exam  Vitals  Blood pressure 185/88, pulse 78, temperature 97.6 F (36.4 C), temperature source Oral, resp. rate 20, SpO2 100 %.   General  lying in bed in NAD, A+O x3  HEENT Ears and Eyes appear Normal, Conjunctivae clear, PERRLA. Arcus senilis. Moist Oral Mucosa.   Neck supple , No JVD, No cervical lymphadenopathy appreciated, No Carotid Bruits.port-wine stain noted in the occipital area  Lungs:  Symmetrical Chest wall movement, Good air movement bilaterally, CTAB.  Cardio: Atrial Fib, irregularly regular, No Gallops, Rubs 1/6 systolic murmur, No Parasternal Heave.  GI:  Positive Bowel Sounds, Abdomen Soft, No tenderness, No organomegaly appriciated,No rebound -guarding or rigidity.  Skin  No Cyanosis, Normal Skin Turgor, No Skin Rash or Bruise.  Lymph:  No Palpable Lymph Nodes in Neck or  Axillae   Musculoskeletal :Good muscle tone,  joints appear normal , no effusions, Normal ROM.  Psych:Normal affect and insight, Not Suicidal or Homicidal, Awake Alert, Oriented X 2.  Neuro:   Knows place, person, self, not year (1986) and month (26) ALL C.Nerves Intact, Strength 5/5 all 4 extremities, Sensation intact all 4 extremities, Plantars down going.   CBC  Recent Labs Lab 11/20/15 1008  WBC 6.8  HGB 10.5*  HCT 32.0*  PLT 257  MCV 93.6  MCH 30.7  MCHC 32.8  RDW 13.8   ------------------------------------------------------------------------------------------------------------------  Chemistries   Recent Labs Lab 11/20/15 1008  NA 143  K 4.0  CL 110  CO2 25  GLUCOSE 96  BUN 26*  CREATININE 1.46*  CALCIUM 9.2   ------------------------------------------------------------------------------------------------------------------ estimated creatinine clearance is 26.1 mL/min (by C-G formula based on Cr of 1.46). ------------------------------------------------------------------------------------------------------------------ No results for input(s): TSH, T4TOTAL, T3FREE, THYROIDAB in the last 72 hours.  Invalid input(s): FREET3   Coagulation profile No results for input(s): INR, PROTIME in  the last 168 hours. ------------------------------------------------------------------------------------------------------------------- No results for input(s): DDIMER in the last 72 hours. -------------------------------------------------------------------------------------------------------------------  Cardiac Enzymes No results for input(s): CKMB, TROPONINI, MYOGLOBIN in the last 168 hours.  Invalid input(s): CK ------------------------------------------------------------------------------------------------------------------ Invalid input(s):  POCBNP   ---------------------------------------------------------------------------------------------------------------  Urinalysis    Component Value Date/Time   COLORURINE YELLOW 11/20/2015 1100   APPEARANCEUR CLEAR 11/20/2015 1100   LABSPEC 1.014 11/20/2015 1100   PHURINE 5.5 11/20/2015 1100   GLUCOSEU NEGATIVE 11/20/2015 1100   HGBUR TRACE* 11/20/2015 1100   BILIRUBINUR NEGATIVE 11/20/2015 1100   KETONESUR NEGATIVE 11/20/2015 1100   PROTEINUR NEGATIVE 11/20/2015 1100   UROBILINOGEN 0.2 08/02/2014 0425   NITRITE NEGATIVE 11/20/2015 1100   LEUKOCYTESUR TRACE* 11/20/2015 1100    ----------------------------------------------------------------------------------------------------------------  Imaging results:   Dg Chest 2 View  11/20/2015  CLINICAL DATA:  Confusion.  Hypertension EXAM: CHEST  2 VIEW COMPARISON:  April 12, 2007 FINDINGS: There is mild bibasilar atelectatic change. There is no edema or consolidation. Heart is mildly enlarged with pulmonary vascularity within normal limits. Patient is status post internal mammary bypass grafting. There is degenerative change in the thoracic spine. Bones are osteoporotic. IMPRESSION: Mild bibasilar atelectasis. No edema or consolidation. Mild cardiac enlargement. Bones are osteoporotic. Electronically Signed   By: Bretta Bang III M.D.   On: 11/20/2015 10:26   Ct Head Wo Contrast  11/20/2015  CLINICAL DATA:  Confusion and altered mental status beginning this morning. Stroke in January. EXAM: CT HEAD WITHOUT CONTRAST TECHNIQUE: Contiguous axial images were obtained from the base of the skull through the vertex without intravenous contrast. COMPARISON:  MRI 09/24/2015.  CT 09/23/2015. FINDINGS: The brain shows generalized atrophy. There chronic small-vessel ischemic changes affecting the hemispheric white matter, basal ganglia and thalami. No sign of acute infarction, mass lesion, hemorrhage, hydrocephalus or extra-axial collection.  Sinuses, middle ears and mastoids are clear. There is atherosclerotic calcification of the major vessels at the base of the brain. IMPRESSION: No acute finding by CT. Atrophy and chronic small vessel ischemic changes. Electronically Signed   By: Paulina Fusi M.D.   On: 11/20/2015 11:47     EKG: Rhythm A fib, Rate  62 /min, QTc 445  , no Acute ST changes    Assessment & Plan  Active Problems:   Chronic atrial fibrillation (HCC)   CAD in native artery -->  CABG x4 in 1994   S/P CABG x 4 (1994) : LIMA-LAD, SVG-RI, SVG-OM, SVG-RCA   Dyslipidemia, goal LDL below 70 - on statin, followed by primary physician.   Essential hypertension   Hypothyroidism   Dysphagia   Acute right MCA stroke (HCC)   Anemia   Confusion   Acute encephalopathy of unclear etiology in a patient with recent CVA 08/2015, hypertension, currently elevated .  No history of dementia.UA few bacteria. CBC and CMET o/w at baseline. No changes in meds. Complliant to Plavix. CT of the head without acute abnormality. Afebrile no signs of infectious process.Admit to tele obs.  Obtain MRI.  -Continue home meds Urine culture Ammonia PT/OT   Hypertensive urgency BP 185/88 mmHg  Pulse 78  Temp(Src) 97.6 F (36.4 C) (Oral)  Resp 20  SpO2 100% Blood pressure elevated on admission   Resume Coreg and Lisinopril Add hydralazinewhen necessary with parameters   Chronic kidney disease stage Cr 1.46  IVF -  Minimize nephrotoxins and renally dose medications Lab Results  Component Value Date   CREATININE 1.46* 11/20/2015   CREATININE 1.29 10/06/2015   CREATININE 1.12 09/23/2015  Atrial Fibrillation EKG A fib normal QTC [place] Vasc 8   Rate controlled On PLavix for anticoagulation therapy.     -Continue beta blocker   Heparin sq   Hypothyroidism: Chronic. Last TSH 2/7 at 0.29 -Continue home Synthroid     Hyperlipidemia  Continue home statins     DVT Prophylaxis Heparin  AM Labs Ordered, also please review Full  Orders  Family Communication: Admission, patients condition and plan of care including tests being ordered have been discussed with the patient who indicate understanding and agree with the plan and Code Status.  Code Status Full Code   Likely DC to  home            [facility] HospitalWERTMAN,Lilu Mcglown E M.D on 11/20/2015 at 1:21 PM     [email or web address] - password [email or web address]   [facility] Office  [phone]

## 2015-11-20 NOTE — ED Notes (Addendum)
[facility], with daughter and wife who live with him. Pt is spanish-speaking, daughter is translating and describing events. Pt was in bathroom this morning, wife noticed he was unable to answer questions as normal, so they called EMS. Currently oriented only to self. Denies pain, sob and able to MAE equally and follow some commands. R pupil dilated, nonreactive. Pt had TIA in January, sees neurologist outpatient.

## 2015-11-21 DIAGNOSIS — I6789 Other cerebrovascular disease: Secondary | ICD-10-CM | POA: Diagnosis not present

## 2015-11-21 DIAGNOSIS — I6523 Occlusion and stenosis of bilateral carotid arteries: Secondary | ICD-10-CM | POA: Diagnosis not present

## 2015-11-21 DIAGNOSIS — I63412 Cerebral infarction due to embolism of left middle cerebral artery: Secondary | ICD-10-CM | POA: Diagnosis not present

## 2015-11-21 DIAGNOSIS — I160 Hypertensive urgency: Secondary | ICD-10-CM | POA: Diagnosis not present

## 2015-11-21 DIAGNOSIS — H409 Unspecified glaucoma: Secondary | ICD-10-CM | POA: Diagnosis not present

## 2015-11-21 DIAGNOSIS — I495 Sick sinus syndrome: Secondary | ICD-10-CM | POA: Diagnosis not present

## 2015-11-21 DIAGNOSIS — Z7902 Long term (current) use of antithrombotics/antiplatelets: Secondary | ICD-10-CM | POA: Diagnosis not present

## 2015-11-21 DIAGNOSIS — R410 Disorientation, unspecified: Secondary | ICD-10-CM | POA: Diagnosis not present

## 2015-11-21 DIAGNOSIS — N183 Chronic kidney disease, stage 3 (moderate): Secondary | ICD-10-CM | POA: Diagnosis not present

## 2015-11-21 DIAGNOSIS — E039 Hypothyroidism, unspecified: Secondary | ICD-10-CM | POA: Diagnosis not present

## 2015-11-21 DIAGNOSIS — I129 Hypertensive chronic kidney disease with stage 1 through stage 4 chronic kidney disease, or unspecified chronic kidney disease: Secondary | ICD-10-CM | POA: Diagnosis not present

## 2015-11-21 DIAGNOSIS — Z7901 Long term (current) use of anticoagulants: Secondary | ICD-10-CM | POA: Diagnosis not present

## 2015-11-21 DIAGNOSIS — I63513 Cerebral infarction due to unspecified occlusion or stenosis of bilateral middle cerebral arteries: Secondary | ICD-10-CM | POA: Diagnosis not present

## 2015-11-21 DIAGNOSIS — G9340 Encephalopathy, unspecified: Secondary | ICD-10-CM | POA: Diagnosis not present

## 2015-11-21 DIAGNOSIS — R001 Bradycardia, unspecified: Secondary | ICD-10-CM | POA: Diagnosis not present

## 2015-11-21 DIAGNOSIS — I10 Essential (primary) hypertension: Secondary | ICD-10-CM | POA: Diagnosis not present

## 2015-11-21 DIAGNOSIS — I2510 Atherosclerotic heart disease of native coronary artery without angina pectoris: Secondary | ICD-10-CM | POA: Diagnosis not present

## 2015-11-21 DIAGNOSIS — E1165 Type 2 diabetes mellitus with hyperglycemia: Secondary | ICD-10-CM | POA: Diagnosis not present

## 2015-11-21 DIAGNOSIS — Z79899 Other long term (current) drug therapy: Secondary | ICD-10-CM | POA: Diagnosis not present

## 2015-11-21 DIAGNOSIS — J9811 Atelectasis: Secondary | ICD-10-CM | POA: Diagnosis not present

## 2015-11-21 DIAGNOSIS — F4489 Other dissociative and conversion disorders: Secondary | ICD-10-CM | POA: Diagnosis not present

## 2015-11-21 DIAGNOSIS — D649 Anemia, unspecified: Secondary | ICD-10-CM | POA: Diagnosis not present

## 2015-11-21 DIAGNOSIS — Z951 Presence of aortocoronary bypass graft: Secondary | ICD-10-CM | POA: Diagnosis not present

## 2015-11-21 DIAGNOSIS — Z8673 Personal history of transient ischemic attack (TIA), and cerebral infarction without residual deficits: Secondary | ICD-10-CM | POA: Diagnosis not present

## 2015-11-21 DIAGNOSIS — E1122 Type 2 diabetes mellitus with diabetic chronic kidney disease: Secondary | ICD-10-CM | POA: Diagnosis not present

## 2015-11-21 DIAGNOSIS — I63511 Cerebral infarction due to unspecified occlusion or stenosis of right middle cerebral artery: Secondary | ICD-10-CM

## 2015-11-21 DIAGNOSIS — I482 Chronic atrial fibrillation: Secondary | ICD-10-CM | POA: Diagnosis not present

## 2015-11-21 DIAGNOSIS — E785 Hyperlipidemia, unspecified: Secondary | ICD-10-CM | POA: Diagnosis not present

## 2015-11-21 DIAGNOSIS — I639 Cerebral infarction, unspecified: Secondary | ICD-10-CM | POA: Diagnosis not present

## 2015-11-21 DIAGNOSIS — R1310 Dysphagia, unspecified: Secondary | ICD-10-CM | POA: Diagnosis not present

## 2015-11-21 LAB — URINALYSIS, ROUTINE W REFLEX MICROSCOPIC
Bilirubin Urine: NEGATIVE
GLUCOSE, UA: NEGATIVE mg/dL
Ketones, ur: NEGATIVE mg/dL
Leukocytes, UA: NEGATIVE
Nitrite: NEGATIVE
PROTEIN: NEGATIVE mg/dL
SPECIFIC GRAVITY, URINE: 1.014 (ref 1.005–1.030)
pH: 6 (ref 5.0–8.0)

## 2015-11-21 LAB — URINE MICROSCOPIC-ADD ON

## 2015-11-21 NOTE — Progress Notes (Addendum)
[facility] PROGRESS NOTE  Jacob SloopRamon F [name] ZOX:[id number] DOB: 1917/06/14 DOA: 11/20/2015 PCP: Jacob BoydenJavier Gutierrez, MD  Assessment/Plan: Active Problems:   Acute right MCA stroke [facility](HCC) - Patient currently undergoing stroke workup.  - Will be followed by Neurology stroke  team.     Chronic atrial fibrillation (HCC) - continue carvedilol for rate control - pt on plavix at home. Patient is off anticoagulation secondary to fall with surgical hematoma in 2015.    CAD in native artery -->  CABG x4 in 1994 - stable no chest pain - plavix    S/P CABG x 4 (1994) : LIMA-LAD, SVG-RI, SVG-OM, SVG-RCA - continue statin and plavix    Dyslipidemia, goal LDL below 70 - on statin, followed by primary physician. - Continue Zocor    Essential hypertension - on carvedilol Addendum: for rate control of Afib but otherwise will allow for permissive hypertension.    Hypothyroidism - continue synthroid     Dysphagia - speech therapy for swallow assessment   Code Status: full Family Communication: None at bedside Disposition Plan: pending improvement in condition   Consultants:  Neurology  Procedures:  None  Antibiotics:   None  HPI/Subjective: Pt has no new complaints. No acute issues overnight.  Objective: Filed Vitals:   11/20/15 2313 11/21/15 0458  BP: 144/62 152/65  Pulse: 63 58  Temp: 98.9 F (37.2 C) 98.2 F (36.8 C)  Resp: 18 18    Intake/Output Summary (Last 24 hours) at 11/21/15 1328 Last data filed at 11/21/15 0306  Gross per 24 hour  Intake      0 ml  Output    425 ml  Net   -425 ml   Filed Weights   11/20/15 1613  Weight: 63.594 kg (140 lb 3.2 oz)    Exam:   General:  Pt in nad, alert and awake  Cardiovascular: irregularly irregular, no rubs  Respiratory: no increased wob, no wheezes  Abdomen: soft, nt, nd  Musculoskeletal: no cyanosis or clubbing   Data Reviewed: Basic Metabolic Panel:  Recent Labs Lab 11/20/15 1008 11/20/15 1408   NA 143  --   K 4.0  --   CL 110  --   CO2 25  --   GLUCOSE 96  --   BUN 26*  --   CREATININE 1.46* 1.47*  CALCIUM 9.2  --    Liver Function Tests: No results for input(s): AST, ALT, ALKPHOS, BILITOT, PROT, ALBUMIN in the last 168 hours. No results for input(s): LIPASE, AMYLASE in the last 168 hours.  Recent Labs Lab 11/20/15 1408  AMMONIA 36*   CBC:  Recent Labs Lab 11/20/15 1008 11/20/15 1408  WBC 6.8 7.0  HGB 10.5* 10.4*  HCT 32.0* 30.5*  MCV 93.6 93.6  PLT 257 238   Cardiac Enzymes: No results for input(s): CKTOTAL, CKMB, CKMBINDEX, TROPONINI in the last 168 hours. BNP (last 3 results) No results for input(s): BNP in the last 8760 hours.  ProBNP (last 3 results) No results for input(s): PROBNP in the last 8760 hours.  CBG: No results for input(s): GLUCAP in the last 168 hours.  No results found for this or any previous visit (from the past 240 hour(s)).   Studies: Dg Chest 2 View  11/20/2015  CLINICAL DATA:  Confusion.  Hypertension EXAM: CHEST  2 VIEW COMPARISON:  April 12, 2007 FINDINGS: There is mild bibasilar atelectatic change. There is no edema or consolidation. Heart is mildly enlarged with pulmonary vascularity within normal limits. Patient is status  post internal mammary bypass grafting. There is degenerative change in the thoracic spine. Bones are osteoporotic. IMPRESSION: Mild bibasilar atelectasis. No edema or consolidation. Mild cardiac enlargement. Bones are osteoporotic. Electronically Signed   By: Bretta Bang III M.D.   On: 11/20/2015 10:26   Ct Head Wo Contrast  11/20/2015  CLINICAL DATA:  Confusion and altered mental status beginning this morning. Stroke in January. EXAM: CT HEAD WITHOUT CONTRAST TECHNIQUE: Contiguous axial images were obtained from the base of the skull through the vertex without intravenous contrast. COMPARISON:  MRI 09/24/2015.  CT 09/23/2015. FINDINGS: The brain shows generalized atrophy. There chronic small-vessel  ischemic changes affecting the hemispheric white matter, basal ganglia and thalami. No sign of acute infarction, mass lesion, hemorrhage, hydrocephalus or extra-axial collection. Sinuses, middle ears and mastoids are clear. There is atherosclerotic calcification of the major vessels at the base of the brain. IMPRESSION: No acute finding by CT. Atrophy and chronic small vessel ischemic changes. Electronically Signed   By: Paulina Fusi M.D.   On: 11/20/2015 11:47   Mr Brain Wo Contrast  11/20/2015  CLINICAL DATA:  82 year old male with confusion since 0740 hours today. Initial encounter. Right MCA lacunar infarct in January. EXAM: MRI HEAD WITHOUT CONTRAST TECHNIQUE: Multiplanar, multiecho pulse sequences of the brain and surrounding structures were obtained without intravenous contrast. COMPARISON:  Head CT without contrast 1144 hours today. Brain MRI 09/24/2015. FINDINGS: Major intracranial vascular flow voids are stable. However, there is restricted diffusion involving the anterior left frontal lobe, epicenter at the left middle frontal gyrus. 4 cm area of cortex and subcortical white matter affected. Mild cytotoxic edema with no associated hemorrhage or mass effect. Superimposed 3-4 mm acute cortical restricted diffusion in the superior left parietal lobe abutting the sensory strip (series 4, image 42). Expected resolution of the small areas of restricted diffusion seen in January. No other restricted diffusion. Elsewhere stable gray and white matter signal since January. No midline shift, mass effect, evidence of mass lesion, ventriculomegaly, extra-axial collection or acute intracranial hemorrhage. Cervicomedullary junction and pituitary are within normal limits. Stable paranasal sinuses, mastoids, orbits soft tissues and scalp soft tissues. Normal bone marrow signal. Stable visualized cervical spine. IMPRESSION: 1. Acute anterior division left MCA infarct. No associated mass effect or hemorrhage. 2.  Superimposed small acute lacunar infarct in the posterior MCA territory, near the superior sensory strip. 3. Elsewhere stable MRI appearance of the brain since January. Electronically Signed   By: Odessa Fleming M.D.   On: 11/20/2015 16:22    Scheduled Meds: . carvedilol  3.125 mg Oral BID WC  . clopidogrel  75 mg Oral Daily  . heparin  5,000 Units Subcutaneous 3 times per day  . latanoprost  1 drop Both Eyes QHS  . levothyroxine  75 mcg Oral QAC breakfast  . lisinopril  20 mg Oral Daily  . simvastatin  20 mg Oral q1800  . timolol  1 drop Both Eyes QPM   Continuous Infusions: . sodium chloride 50 mL/hr at 11/20/15 2212    Time spent: > 35 minutes    Penny Pia  [facility] Pager [phone] If 7PM-7AM, please contact night-coverage at [email or web address], password [facility] 11/21/2015, 1:28 PM

## 2015-11-21 NOTE — Evaluation (Signed)
Clinical/Bedside Swallow Evaluation Patient Details  Name: Normand SloopRamon F Cua MRN: [id number] Date of Birth: 1916-12-09  Today's Date: 11/21/2015 Time: SLP Start Time (ACUTE ONLY): 1440 SLP Stop Time (ACUTE ONLY): 1501 SLP Time Calculation (min) (ACUTE ONLY): 21 min  Past Medical History:  Past Medical History  Diagnosis Date  . CAD in native artery -->  1994    referred for CABG  . S/P CABG x 4 1994    a) 1994: LIMA-LAD, SVG-RI, SVG-OM, SVG-RCA; b) 09/2006, Persantine Myoview: fixed inferior defect consistent with infarct/scar without peri-infarct ischemia;; 2-D echo: Mild-mod concentric LVH. Normal EF.  Abnormal relaxation, aortic sclerosis with mild-mod AI  . Chronic atrial fibrillation (HCC) 12/15/2012  . Long term (current) use of anticoagulants 12/15/2012    warfarin  . Essential hypertension 05/23/2013  . Dyslipidemia, goal LDL below 70[272.4]      - on statin, followed by primary physician.   . Glaucoma   . History of kidney stones   . H/O seasonal allergies   . Diverticulitis   . C4 cervical fracture (HCC) 2015    with spinal cord contusion - stopped coumadin.  . Acute right MCA stroke (HCC) 09/23/2015   Past Surgical History:  Past Surgical History  Procedure Laterality Date  . Coronary artery bypass graft  1994    LIMA-LAD, SVG-RI, SVG-OM, SVG-RCA   . Persantine myoview  February 2008    Fixed inferior wall defect-consistent with infarct/scar without peri-infarct ischemia  . Transthoracic echocardiogram  February 2008    Mild/mod conc LVH, Nl Size & Fxn, Gr 1 DD(abnormal relaxation), mild to moderate aortic regurgitation and mildly sclerotic aortic valve.  Marland Kitchen. Appendectomy    . Hand surgery Left 1985  . Femur surgery Left 2000    replacement- due to fall   HPI:  82 y.o. male With a history of Afib on Plavix, CAD status post CABG, hypertension, presenting history of acute RMCA stroke in January 2017, presenting with one episode of confusion this morning approximately at  7:40 AM described as repetitive, cleaning his hands with a towel in a coordinated manner, without interruption. No fall was witnessed. No motor or sensory abnormalities.History is obtained by wife and granddaughter, as confusion has not completely resolved;   Assessment / Plan / Recommendation Clinical Impression   Pt did not exhibit any s/s of aspiration at this time with any given consistency including thin via cup/straw-regular consistency and exhibited a normal oropharyngeal swallow; ST may need to f/u for SLE if needed as pt speaks Spanish as first language; he followed simple 1-2 step commands during BSE and responded appropriately to questions in his first language with simple responses.  Family did not indicate any changes in communication at this time.  MR head indicated on 11/20/15 Acute anterior division left MCA infarct. No associated mass effect or hemorrhage. 2. Superimposed small acute lacunar infarct in the posterior MCA territory, near the superior sensory strip    Aspiration Risk    None   Diet Recommendation   Regular/thin  Medication Administration: Other (Comment) (crushed with puree per pt preference)    Other  Recommendations Oral Care Recommendations: Oral care BID   Follow up Recommendations  None    Frequency and Duration   n/a         Prognosis   Good     Swallow Study   General Date of Onset: 11/20/15 HPI: 82 y.o. male With a history of Afib on Plavix, CAD status post CABG, hypertension, presenting history of  acute RMCA stroke in January 2017, presenting with one episode of confusion this morning approximately at 7:40 AM described as repetitive, cleaning his hands with a towel in a coordinated manner, without interruption. No fall was witnessed. No motor or sensory abnormalities.History is obtained by wife and granddaughter, as confusion has not completely resolved. Type of Study: Bedside Swallow Evaluation Previous Swallow Assessment:  (none) Diet Prior to  this Study: Regular;Thin liquids Temperature Spikes Noted: No Respiratory Status: Room air History of Recent Intubation: No Behavior/Cognition: Alert;Cooperative;Pleasant mood Oral Cavity Assessment: Within Functional Limits Oral Care Completed by SLP: No Oral Cavity - Dentition: Adequate natural dentition Vision: Functional for self-feeding Self-Feeding Abilities: Able to feed self Patient Positioning: Upright in bed Baseline Vocal Quality: Normal Volitional Cough: Strong Volitional Swallow: Able to elicit    Oral/Motor/Sensory Function Overall Oral Motor/Sensory Function: Within functional limits   Ice Chips Ice chips: Within functional limits Presentation: Spoon   Thin Liquid Thin Liquid: Within functional limits Presentation: Cup;Straw    Nectar Thick Nectar Thick Liquid: Not tested   Honey Thick Honey Thick Liquid: Not tested   Puree Puree: Within functional limits Presentation: Self Fed   Solid      Solid: Within functional limits Presentation: Self Fed    Functional Assessment Tool Used: NOMS Functional Limitations: Swallowing Swallow Current Status (Z6109): 0 percent impaired, limited or restricted Swallow Goal Status (U0454): 0 percent impaired, limited or restricted Swallow Discharge Status (U9811): 0 percent impaired, limited or restricted   Dhillon [place],PAT, M.S., CCC-SLP 11/21/2015,3:11 PM

## 2015-11-21 NOTE — Evaluation (Signed)
Physical Therapy Evaluation Patient Details Name: Jacob SloopRamon F [name] MRN: [id number] DOB: 01-26-1917 Today's Date: 11/21/2015   History of Present Illness  82 yo male with L MCA infarct after R MCA infarct Jan 2017,  home with his wife and daughter usually, was independent on no AD prior to this indoors, SPC outdoors.  PMHx:  osteoporosis, A-fib   Clinical Impression  Pt presents with another stroke after first event three months ago, very motivated to get up but has balance changes that may be from prior to even this event.  He is very supported by family but tends to leave AD aside per daughter, not really relying on furniture to mobilize.  Will need follow up therapy to encourage safety and to restore balance to allow his family to feel secure about the fall risk he presents.    Follow Up Recommendations Home health PT;Supervision for mobility/OOB    Equipment Recommendations  None recommended by PT    Recommendations for Other Services       Precautions / Restrictions Precautions Precautions: Fall (telemetry) Restrictions Weight Bearing Restrictions: No      Mobility  Bed Mobility Overal bed mobility: Needs Assistance Bed Mobility: Supine to Sit;Sit to Supine     Supine to sit: Min assist Sit to supine: Min assist   General bed mobility comments: Pt mainly can get to side of bed and assisted to steady him and ensure he feels well enough to progress to gait  Transfers Overall transfer level: Needs assistance Equipment used: 1 person hand held assist Transfers: Sit to/from UGI CorporationStand;Stand Pivot Transfers Sit to Stand: Min assist;Min guard Stand pivot transfers: Min guard       General transfer comment: used hands on bed appropriately  Ambulation/Gait Ambulation/Gait assistance: Min guard;Min assist Ambulation Distance (Feet): 275 Feet Assistive device: 1 person hand held assist Gait Pattern/deviations: Step-through pattern;Wide base of support Gait velocity:  decreased Gait velocity interpretation: Below normal speed for age/gender General Gait Details: loses balance to L a couple times and steadied by PT  Stairs Stairs: Yes Stairs assistance: Min guard Stair Management: One rail Left;Forwards;Alternating pattern (slower coming down stairs) Number of Stairs: 3 General stair comments: pt was careful and did not lose balance with railing to assist  Wheelchair Mobility    Modified Rankin (Stroke Patients Only) Modified Rankin (Stroke Patients Only) Pre-Morbid Rankin Score: Slight disability Modified Rankin: Moderate disability     Balance Overall balance assessment: Needs assistance Sitting-balance support: Feet supported Sitting balance-Leahy Scale: Good   Postural control: Posterior lean Standing balance support: Single extremity supported Standing balance-Leahy Scale: Fair Standing balance comment: fair- dynamic standing balance                             Pertinent Vitals/Pain Pain Assessment: No/denies pain    Home Living Family/patient expects to be discharged to:: Private residence Living Arrangements: Spouse/significant other;Children Available Help at Discharge: Family Type of Home: House Home Access: Stairs to enter Entrance Stairs-Rails: Left Entrance Stairs-Number of Steps: 3 Home Layout: One level Home Equipment: Walker - 2 wheels;Tub bench;Cane - single point      Prior Function Level of Independence: Independent with assistive device(s)         Comments: Use of single point cane for community ambulation     Hand Dominance   Dominant Hand: Right    Extremity/Trunk Assessment   Upper Extremity Assessment: Overall WFL for tasks assessed  Lower Extremity Assessment: Overall WFL for tasks assessed      Cervical / Trunk Assessment: Normal  Communication   Communication: Prefers language other than [place];Interpreter utilized;Other (comment) (Spanish)  Cognition  Arousal/Alertness: Awake/alert Behavior During Therapy: WFL for tasks assessed/performed Overall Cognitive Status: Within Functional Limits for tasks assessed                      General Comments General comments (skin integrity, edema, etc.): Pt was noted to have controlled awareness of needing help and reaching for PT support but daughter reports he will not use AD at home reliably.      Exercises        Assessment/Plan    PT Assessment Patient needs continued PT services  PT Diagnosis Difficulty walking;Hemiplegia dominant side   PT Problem List Decreased strength;Decreased activity tolerance;Decreased balance;Decreased mobility;Decreased coordination;Decreased safety awareness;Cardiopulmonary status limiting activity  PT Treatment Interventions DME instruction;Gait training;Functional mobility training;Therapeutic activities;Therapeutic exercise;Stair training;Balance training;Neuromuscular re-education;Patient/family education   PT Goals (Current goals can be found in the Care Plan section) Acute Rehab PT Goals Patient Stated Goal: none stated PT Goal Formulation: With patient/family Time For Goal Achievement: 12/05/15 Potential to Achieve Goals: Good    Frequency Min 3X/week   Barriers to discharge Other (comment) (will need supervision of his mobiltiy due to impulsiveness)      Co-evaluation               End of Session Equipment Utilized During Treatment: Gait belt Activity Tolerance: Patient tolerated treatment well Patient left: in bed;with call bell/phone within reach;with bed alarm set;with family/visitor present Nurse Communication: Mobility status    Functional Assessment Tool Used: clinical judgment Functional Limitation: Mobility: Walking and moving around Mobility: Walking and Moving Around Current Status (Z6109): At least 20 percent but less than 40 percent impaired, limited or restricted Mobility: Walking and Moving Around Goal Status  [phone]): At least 20 percent but less than 40 percent impaired, limited or restricted    Time: 0932-0956 PT Time Calculation (min) (ACUTE ONLY): 24 min   Charges:   PT Evaluation $PT Eval Moderate Complexity: 1 Procedure PT Treatments $Gait Training: 8-22 mins   PT G Codes:   PT G-Codes **NOT FOR INPATIENT CLASS** Functional Assessment Tool Used: clinical judgment Functional Limitation: Mobility: Walking and moving around Mobility: Walking and Moving Around Current Status (U9811): At least 20 percent but less than 40 percent impaired, limited or restricted Mobility: Walking and Moving Around Goal Status [phone]): At least 20 percent but less than 40 percent impaired, limited or restricted    Ivar Drape 11/21/2015, 1:41 PM   Samul Dada, PT MS Acute Rehab Dept. Number: [facility] [id number] and [facility] [phone]

## 2015-11-21 NOTE — Progress Notes (Signed)
STROKE TEAM PROGRESS NOTE   HISTORY OF PRESENT ILLNESS at the time of admission Jacob [name] is an 82 y.o. male with a history of stroke in January 2017, atrial fibrillation, hypertension and coronary artery disease, brought to the emergency room following an episode of confusion. Patient reportedly was using atenolol to clean his hands repetitively without interruption. He did not respond to questions from family members. No facial droop was noted. No weakness of extremities was noted. He was not recently given new medication. No tonic or clonic activity was reported. Patient was still somewhat confused when he arrived in the emergency room. His confusion improved following arrival. MRI of his brain showed acute anterior division left MCA infarction as well as superimposed small acute lacunar infarction in posterior MCA territory.  LKW: 7:40 AM on 11/20/2015 TPA given: No: Recent stroke in January 2017   SUBJECTIVE (INTERVAL HISTORY) His daughter and wife are is at the bedside.  Overall he feels his condition is rapidly improving. Daughter helped with ROS, examination and discussion by acting as an interpreter/  Patient is without complaints   OBJECTIVE Temp:  [97.6 F (36.4 C)-98.9 F (37.2 C)] 98.2 F (36.8 C) (03/25 0458) Pulse Rate:  [58-83] 58 (03/25 0458) Cardiac Rhythm:  [-] Atrial fibrillation (03/24 2036) Resp:  [16-27] 18 (03/25 0458) BP: (144-185)/(62-137) 152/65 mmHg (03/25 0458) SpO2:  [95 %-100 %] 98 % (03/25 0458) Weight:  [63.594 kg (140 lb 3.2 oz)] 63.594 kg (140 lb 3.2 oz) (03/24 1613)  CBC:  Recent Labs Lab 11/20/15 1008 11/20/15 1408  WBC 6.8 7.0  HGB 10.5* 10.4*  HCT 32.0* 30.5*  MCV 93.6 93.6  PLT 257 238    Basic Metabolic Panel:  Recent Labs Lab 11/20/15 1008 11/20/15 1408  NA 143  --   K 4.0  --   CL 110  --   CO2 25  --   GLUCOSE 96  --   BUN 26*  --   CREATININE 1.46* 1.47*  CALCIUM 9.2  --     Lipid Panel:    Component Value  Date/Time   CHOL 122 09/23/2015 1911   CHOL 130 07/03/2013   TRIG 147 09/23/2015 1911   TRIG 123 07/03/2013   HDL 44 09/23/2015 1911   CHOLHDL 2.8 09/23/2015 1911   VLDL 29 09/23/2015 1911   LDLCALC 49 09/23/2015 1911   LDLCALC 64 07/03/2013   HgbA1c: No results found for: HGBA1C Urine Drug Screen: No results found for: LABOPIA, COCAINSCRNUR, LABBENZ, AMPHETMU, THCU, LABBARB    IMAGING  Dg Chest 2 View  11/20/2015   Mild bibasilar atelectasis. No edema or consolidation. Mild cardiac enlargement. Bones are osteoporotic.   Ct Head Wo Contrast 11/20/2015   No acute finding by CT. Atrophy and chronic small vessel ischemic changes.   Mr Brain Wo Contrast 11/20/2015   1. Acute anterior division left MCA infarct. No associated mass effect or hemorrhage.  2. Superimposed small acute lacunar infarct in the posterior MCA territory, near the superior sensory strip.  3. Elsewhere stable MRI appearance of the brain since January.    PHYSICAL EXAM General - Well nourished, well developed, in NAD   Cardiovascular - irregular rate and rhythm Pulmonary: CTA Abdomen: NT, ND, normal bowel sounds Extremities: No C/C/E  Neurological Exam Mental Status: oriented to all except year; knew president Speech:  Fluent; no dysarthria Followed commands  Cranial Nerves:  PERRL; EOMI; visual fields full, face grossly symmetric, hearing grossly intact; shrug symmetric and tongue midline  Motor Exam:  Tone:  Within normal limits; Strength: 5/5 throughout  Sensory: Intact to light touch throughout  Coordination:  Intact finger to nose  Gait: Deferred   ASSESSMENT/PLAN [name] is a 82 y.o. male with history of coronary artery disease with previous coronary artery bypass graft surgery, chronic atrial fibrillation,warfarin in the past stopped secondary to a spinal cord contusion, essential hypertension, dyslipidemia, history of a C4 cervical fracture, history of an acute right  middle cerebral artery stroke in January 2017. presenting with confusion. He did not receive IV t-PA due to recent stroke in January 2017   Stroke:  Dominant Left MCA infarct probably embolic secondary to atrial fibrillation without anticoagulation.  Resultant  confusion  MRI  acute anterior division left MCA infarction   MRA  ot performed  Carotid Doppler  bilateral internal carotid artery stenosis of 1-49%. Vertebral arteries patent. (1/26)  2D Echo EF 60-65%. No thrombus or vegetation noted. (09/24/2015); will repeat to look for frank thrombus given multiple event in a short time  LDL - 49  HgbA1c 10.4  VTE prophylaxis - Subcutaneous heparin  Diet regular Room service appropriate?: Yes; Fluid consistency:: Thin  clopidogrel 75 mg daily prior to admission, now on clopidogrel 75 mg daily  Patient counseled to be compliant with his antithrombotic medications  Ongoing aggressive stroke risk factor management  Therapy recommendations: Home health physical therapy recommended  Disposition: pending  Hypertension  Stable  Permissive hypertension (OK if < 220/120) but gradually normalize in 5-7 days  Hyperlipidemia  Home meds: Zocor 20 mg daily  resumed in hospital  LDL 49, goal < 70  Continue statin at discharge  Diabetes  HgbA1c 10.4, goal < 7.0  Uncontrolled  Other Stroke Risk Factors  Advanced age  Hx stroke/TIA  Coronary artery disease   Other Active Problems  Elevated creatinine 1.47  Anemia hemoglobin 10.4    hematocrit 30.5  Hospital day #   ATTENDING NOTE: Patient was seen and examined by me personally. Documentation reflects findings. The laboratory and radiographic studies reviewed by me. ROS completed by me personally and pertinent positives fully documented  Condition: stable  Assessment and plan completed by me personally and fully documented above. Plans/Recommendations include:     Lengthy talk with daughter, patient and family  regarding the multiple strokes since off of coumadin.  Daughter is aware of the concept of the HAS-BLED.  We discussed at length the risk/benefit analysis of placing patient on coumadin or novel agent.  She believes strongly that anticoagulation is appropriate.  I agree with the following plan/caveats  Would repeat CT scan on Monday and if no hemorrhagic transformation, start anticoagulation  Will review PT/OT safety eval to ensure new stroke has not further increased patient's risk for falling.  Of not, he has a rof in one femur and walk with a limp but is described as "steady"  Daughter agrees with this plan  ECHO/carotids pending.    SIGNED BY: Dr. Sula Soda      To contact Stroke Continuity provider, please refer to [email or web address]. After hours, contact General Neurology

## 2015-11-21 NOTE — Progress Notes (Signed)
Had a discussion with patient's daughter or family member about his condition. I answered her questions to her satisfaction. She was raising her voice with me and angry that she didn't have her questions answered. I addressed with her that during my visit I addressed all questions that her father, mother, and she had at the time. She stated that since she didn't know that I was the attending she didn't have any other questions at that time. I asked her what i could answer for her and she stated that she did not have the results of the MRI which I stated the neurologist would be addressing with her but I would be happy to read to her. I read her the results and she interrupted me and loudly with a hostile tone of voice stated, "oh no, you WILL give me the results in ley mans terms as you know I am not a doctor." I politely stated that if she did not bring down her tone of voice I would hang up the phone and that I was trying to be nice and address her questions but that most of the questions would need to be addressed by the rounding neurologist. She then lowered her voice and demanded I answer more of her questions.   I answered her questions to her satisfaction. She has requested a different physician. Of which mutually I agree will occur starting tomorrow. Discussed with charge nurse who indicated that family member was rude to her as well.  Mickle Campton, [employer]

## 2015-11-22 ENCOUNTER — Encounter: Payer: Self-pay | Admitting: Family Medicine

## 2015-11-22 ENCOUNTER — Inpatient Hospital Stay (HOSPITAL_COMMUNITY): Payer: PPO

## 2015-11-22 DIAGNOSIS — I639 Cerebral infarction, unspecified: Secondary | ICD-10-CM

## 2015-11-22 LAB — BASIC METABOLIC PANEL
ANION GAP: 9 (ref 5–15)
BUN: 31 mg/dL — ABNORMAL HIGH (ref 6–20)
CALCIUM: 8.7 mg/dL — AB (ref 8.9–10.3)
CO2: 22 mmol/L (ref 22–32)
Chloride: 109 mmol/L (ref 101–111)
Creatinine, Ser: 1.67 mg/dL — ABNORMAL HIGH (ref 0.61–1.24)
GFR calc Af Amer: 38 mL/min — ABNORMAL LOW (ref >60)
GFR calc non Af Amer: 33 mL/min — ABNORMAL LOW (ref >60)
GLUCOSE: 103 mg/dL — AB (ref 65–99)
Potassium: 4 mmol/L (ref 3.5–5.1)
Sodium: 140 mmol/L (ref 135–145)

## 2015-11-22 LAB — MAGNESIUM: Magnesium: 1.9 mg/dL (ref 1.7–2.4)

## 2015-11-22 LAB — URINE CULTURE

## 2015-11-22 MED ORDER — TIMOLOL MALEATE 0.5 % OP SOLN
1.0000 [drp] | Freq: Every morning | OPHTHALMIC | Status: DC
  Administered 2015-11-22: 1 [drp] via OPHTHALMIC
  Filled 2015-11-22: qty 5

## 2015-11-22 NOTE — Progress Notes (Signed)
[facility] PROGRESS NOTE  Jacob [name] WGN:[id number] DOB: 09-Aug-1917 DOA: 11/20/2015  PCP: Eustaquio Boyden, MD  Brief HPI: 82 year old male of Hispanic origin with a past medical history of atrial fibrillation who has been on Plavix, history of coronary artery disease who was recently discharged from [facility] after being admitted for acute stroke. Patient presented with altered mental status. Patient was found to have new acute stroke. Hospitalized for further management  Past medical history:  Past Medical History  Diagnosis Date  . CAD in native artery -->  1994    referred for CABG  . S/P CABG x 4 1994    a) 1994: LIMA-LAD, SVG-RI, SVG-OM, SVG-RCA; b) 09/2006, Persantine Myoview: fixed inferior defect consistent with infarct/scar without peri-infarct ischemia;; 2-D echo: Mild-mod concentric LVH. Normal EF.  Abnormal relaxation, aortic sclerosis with mild-mod AI  . Chronic atrial fibrillation (HCC) 12/15/2012  . Long term (current) use of anticoagulants 12/15/2012    warfarin  . Essential hypertension 05/23/2013  . Dyslipidemia, goal LDL below 70[272.4]      - on statin, followed by primary physician.   . Glaucoma   . History of kidney stones   . H/O seasonal allergies   . Diverticulitis   . C4 cervical fracture (HCC) 2015    with spinal cord contusion - stopped coumadin.  . Acute right MCA stroke (HCC) 09/23/2015, 10/2015    recurrent 2 months later    Consultants: Neurology  Procedures:  Echocardiogram is pending  Carotid Doppler is pending  Antibiotics: None  Subjective: Patient seen in his room with his wife and daughter at bedside. He denies any complaints. He feels well.  Objective: Vital Signs  Filed Vitals:   11/21/15 0458 11/21/15 1502 11/21/15 2109 11/22/15 0457  BP: 152/65 154/65 155/61 147/79  Pulse: 58 57 75 66  Temp: 98.2 F (36.8 C) 97.8 F (36.6 C) 98.4 F (36.9 C) 98.4 F (36.9 C)  TempSrc: Oral Oral Oral Oral  Resp: Height:      Weight:      SpO2: 98% 99% 99% 97%    Intake/Output Summary (Last 24 hours) at 11/22/15 1209 Last data filed at 11/22/15 0900  Gross per 24 hour  Intake 2567.5 ml  Output    300 ml  Net 2267.5 ml   Filed Weights   11/20/15 1613  Weight: 63.594 kg (140 lb 3.2 oz)    General appearance: alert, cooperative, appears stated age and no distress Resp: clear to auscultation bilaterally Cardio: regular rate and rhythm, S1, S2 normal, no murmur, click, rub or gallop GI: soft, non-tender; bowel sounds normal; no masses,  no organomegaly Extremities: extremities normal, atraumatic, no cyanosis or edema Neurologic: Awake and alert. Oriented 3. No cranial nerve deficits noted. Tongue is midline. Motor strength equal bilateral upper and lower extremities.  Lab Results:  Basic Metabolic Panel:  Recent Labs Lab 11/20/15 1008 11/20/15 1408  NA 143  --   K 4.0  --   CL 110  --   CO2 25  --   GLUCOSE 96  --   BUN 26*  --   CREATININE 1.46* 1.47*  CALCIUM 9.2  --     Recent Labs Lab 11/20/15 1408  AMMONIA 36*   CBC:  Recent Labs Lab 11/20/15 1008 11/20/15 1408  WBC 6.8 7.0  HGB 10.5* 10.4*  HCT 32.0* 30.5*  MCV 93.6 93.6  PLT 257 238    Studies/Results: Mr Brain Wo Contrast  11/20/2015  CLINICAL DATA:  82 year old male with confusion since 0740 hours today. Initial encounter. Right MCA lacunar infarct in January. EXAM: MRI HEAD WITHOUT CONTRAST TECHNIQUE: Multiplanar, multiecho pulse sequences of the brain and surrounding structures were obtained without intravenous contrast. COMPARISON:  Head CT without contrast 1144 hours today. Brain MRI 09/24/2015. FINDINGS: Major intracranial vascular flow voids are stable. However, there is restricted diffusion involving the anterior left frontal lobe, epicenter at the left middle frontal gyrus. 4 cm area of cortex and subcortical white matter affected. Mild cytotoxic edema with no associated hemorrhage or mass  effect. Superimposed 3-4 mm acute cortical restricted diffusion in the superior left parietal lobe abutting the sensory strip (series 4, image 42). Expected resolution of the small areas of restricted diffusion seen in January. No other restricted diffusion. Elsewhere stable gray and white matter signal since January. No midline shift, mass effect, evidence of mass lesion, ventriculomegaly, extra-axial collection or acute intracranial hemorrhage. Cervicomedullary junction and pituitary are within normal limits. Stable paranasal sinuses, mastoids, orbits soft tissues and scalp soft tissues. Normal bone marrow signal. Stable visualized cervical spine. IMPRESSION: 1. Acute anterior division left MCA infarct. No associated mass effect or hemorrhage. 2. Superimposed small acute lacunar infarct in the posterior MCA territory, near the superior sensory strip. 3. Elsewhere stable MRI appearance of the brain since January. Electronically Signed   By: Odessa FlemingH  Hall M.D.   On: 11/20/2015 16:22    Medications:  Scheduled: . carvedilol  3.125 mg Oral BID WC  . clopidogrel  75 mg Oral Daily  . heparin  5,000 Units Subcutaneous 3 times per day  . latanoprost  1 drop Both Eyes QHS  . levothyroxine  75 mcg Oral QAC breakfast  . simvastatin  20 mg Oral q1800  . timolol  1 drop Both Eyes q morning - 10a   Continuous: . sodium chloride 10 mL/hr at 11/22/15 1116   AVW:[id number]:acetaminophen **OR** acetaminophen, hydrALAZINE, HYDROcodone-acetaminophen, morphine injection  Assessment/Plan:  Active Problems:   Chronic atrial fibrillation (HCC)   CAD in native artery -->  CABG x4 in 1994   S/P CABG x 4 (1994) : LIMA-LAD, SVG-RI, SVG-OM, SVG-RCA   Dyslipidemia, goal LDL below 70 - on statin, followed by primary physician.   Essential hypertension   Hypothyroidism   Dysphagia   Acute right MCA stroke (HCC)   Anemia   Confusion   Acute CVA (cerebrovascular accident) (HCC)    Acute right MCA stroke Neurology is following.  Stroke workup is in progress. Plan is to repeat a CT scan of his head tomorrow. If unremarkable, then plan is to place patient on oral anticoagulation. He is currently on Plavix. PT and OT is following. Home health has been recommended. Patient is on statin. LDL was 49 in January.  Chronic atrial fibrillation Continue carvedilol. Patient is on Plavix at home. Apparently he's been off of anticoagulation since 2015 due to a fall resulting in hematoma. Chads 2 vascular score is 8.  Essential hypertension Blood pressure was elevated at the time of admission. Permissive hypertension was allowed. Patient is back on his oral agents currently.  Chronic kidney disease stage III Monitor renal function closely.  Hypothyroidism Continue Synthroid  History of coronary artery disease status post CABG in 1994 Stable. Continue home medications.  DVT Prophylaxis: Subcutaneous heparin    Code Status: Full code  Family Communication: Discussed with the patient  Disposition Plan: Await stroke workup to be completed. Plan is for initiating oral anticoagulation tomorrow depending on results  of repeat CT.    LOS: 1 day   [facility]  [facility] Pager [phone] 11/22/2015, 12:09 PM  If 7PM-7AM, please contact night-coverage at [email or web address], password [facility]

## 2015-11-22 NOTE — Progress Notes (Addendum)
STROKE TEAM PROGRESS NOTE   HISTORY OF PRESENT ILLNESS at the time of admission Jacob [name] is an 82 y.o. male with a history of stroke in January 2017, atrial fibrillation, hypertension and coronary artery disease, brought to the emergency room following an episode of confusion. Patient reportedly was using atenolol to clean his hands repetitively without interruption. He did not respond to questions from family members. No facial droop was noted. No weakness of extremities was noted. He was not recently given new medication. No tonic or clonic activity was reported. Patient was still somewhat confused when he arrived in the emergency room. His confusion improved following arrival. MRI of his brain showed acute anterior division left MCA infarction as well as superimposed small acute lacunar infarction in posterior MCA territory.  LKW: 7:40 AM on 11/20/2015 TPA given: No: Recent stroke in January 2017   SUBJECTIVE (INTERVAL HISTORY) His daughter and wife are is at the bedside.  Overall he feels his condition is rapidly improving. Daughter helped with ROS, examination and discussion by acting as an interpreter/  Patient is without complaints   OBJECTIVE Temp:  [98 F (36.7 C)-98.4 F (36.9 C)] 98 F (36.7 C) (03/26 1334) Pulse Rate:  [55-75] 55 (03/26 1334) Cardiac Rhythm:  [-] Atrial fibrillation (03/26 0845) Resp:  [16-20] 20 (03/26 1334) BP: (147-171)/(61-79) 171/73 mmHg (03/26 1334) SpO2:  [97 %-100 %] 100 % (03/26 1334)  CBC:   Recent Labs Lab 11/20/15 1008 11/20/15 1408  WBC 6.8 7.0  HGB 10.5* 10.4*  HCT 32.0* 30.5*  MCV 93.6 93.6  PLT 257 238    Basic Metabolic Panel:   Recent Labs Lab 11/20/15 1008 11/20/15 1408  NA 143  --   K 4.0  --   CL 110  --   CO2 25  --   GLUCOSE 96  --   BUN 26*  --   CREATININE 1.46* 1.47*  CALCIUM 9.2  --     Lipid Panel:     Component Value Date/Time   CHOL 122 09/23/2015 1911   CHOL 130 07/03/2013   TRIG 147  09/23/2015 1911   TRIG 123 07/03/2013   HDL 44 09/23/2015 1911   CHOLHDL 2.8 09/23/2015 1911   VLDL 29 09/23/2015 1911   LDLCALC 49 09/23/2015 1911   LDLCALC 64 07/03/2013   HgbA1c: No results found for: HGBA1C Urine Drug Screen: No results found for: LABOPIA, COCAINSCRNUR, LABBENZ, AMPHETMU, THCU, LABBARB    IMAGING  Dg Chest 2 View  11/20/2015   Mild bibasilar atelectasis. No edema or consolidation. Mild cardiac enlargement. Bones are osteoporotic.   Ct Head Wo Contrast 11/20/2015   No acute finding by CT. Atrophy and chronic small vessel ischemic changes.   Mr Brain Wo Contrast 11/20/2015   1. Acute anterior division left MCA infarct. No associated mass effect or hemorrhage.  2. Superimposed small acute lacunar infarct in the posterior MCA territory, near the superior sensory strip.  3. Elsewhere stable MRI appearance of the brain since January.    PHYSICAL EXAM General - Well nourished, well developed, in NAD   Cardiovascular - irregular rate and rhythm Pulmonary: CTA Abdomen: NT, ND, normal bowel sounds Extremities: No C/C/E  Neurological Exam Mental Status: oriented to all except year; knew president Speech:  Fluent; no dysarthria Followed commands  Cranial Nerves:  PERRL; EOMI; visual fields full, face grossly symmetric, hearing grossly intact; shrug symmetric and tongue midline  Motor Exam:  Tone:  Within normal limits; Strength: 5/5 throughout  Sensory: Intact to light touch throughout  Coordination:  Intact finger to nose  Gait: Deferred   ASSESSMENT/PLAN [name] is a 82 y.o. male with history of coronary artery disease with previous coronary artery bypass graft surgery, chronic atrial fibrillation,warfarin in the past stopped secondary to a spinal cord contusion, essential hypertension, dyslipidemia, history of a C4 cervical fracture, history of an acute right middle cerebral artery stroke in January 2017. presenting with confusion. He  did not receive IV t-PA due to recent stroke in January 2017   Stroke:  Dominant Left MCA infarct probably embolic secondary to atrial fibrillation without anticoagulation.  Resultant  confusion  MRI  acute anterior division left MCA infarction   MRA  not performed  Carotid Doppler  1-39% ICA stenosis. Vertebral artery flow is antegrade.   2D Echo EF 60-65%. No thrombus or vegetation noted. (09/24/2015); will repeat to look for frank thrombus given multiple event in a short time  LDL - 49  HgbA1c 10.4  VTE prophylaxis - Subcutaneous heparin Diet Heart Room service appropriate?: Yes; Fluid consistency:: Thin  clopidogrel 75 mg daily prior to admission, now on clopidogrel 75 mg daily  Patient counseled to be compliant with his antithrombotic medications  Ongoing aggressive stroke risk factor management  Therapy recommendations: Home health physical therapy recommended  Disposition: pending  Hypertension  Stable  Permissive hypertension (OK if < 220/120) but gradually normalize in 5-7 days  Hyperlipidemia  Home meds: Zocor 20 mg daily  resumed in hospital  LDL 49, goal < 70  Continue statin at discharge  Diabetes  HgbA1c 10.4, goal < 7.0  Uncontrolled  Other Stroke Risk Factors  Advanced age  Hx stroke/TIA  Coronary artery disease   Other Active Problems  Elevated creatinine 1.47  Anemia hemoglobin 10.4    hematocrit 30.5  Check labs in AM  Hospital day # 1  ATTENDING NOTE: Patient was seen and examined by me personally. Documentation reflects findings. The laboratory and radiographic studies reviewed by me. ROS completed by me personally and pertinent positives fully documented  Condition: stable  Assessment and plan completed by me personally and fully documented above. Plans/Recommendations include:     Lengthy talk with daughter, patient and family regarding the multiple strokes since off of coumadin.  Daughter is aware of the concept  of the HAS-BLED.  We discussed at length the risk/benefit analysis of placing patient on coumadin or novel agent.  She believes strongly that anticoagulation is appropriate.  I agree with the following plan/caveats  Would repeat CT scan on Monday and if no hemorrhagic transformation, start anticoagulation  Will review PT/OT safety eval to ensure new stroke has not further increased patient's risk for falling.  Of not, he has a rof in one femur and walk with a limp but is described as "steady"  Daughter agrees with this plan  ECHO pending.    SIGNED BY: Dr. Sula Soda      To contact Stroke Continuity provider, please refer to [email or web address]. After hours, contact General Neurology

## 2015-11-22 NOTE — Progress Notes (Signed)
VASCULAR LAB PRELIMINARY  PRELIMINARY  PRELIMINARY  PRELIMINARY  Carotid duplex completed.    Preliminary report:  1-39% ICA stenosis.  Vertebral artery flow is antegrade.   Ileanna Gemmill, RVT 11/22/2015, 2:59 PM

## 2015-11-23 ENCOUNTER — Ambulatory Visit (HOSPITAL_COMMUNITY): Payer: PPO

## 2015-11-23 ENCOUNTER — Inpatient Hospital Stay (HOSPITAL_COMMUNITY): Payer: PPO

## 2015-11-23 DIAGNOSIS — I6789 Other cerebrovascular disease: Secondary | ICD-10-CM

## 2015-11-23 DIAGNOSIS — I639 Cerebral infarction, unspecified: Secondary | ICD-10-CM

## 2015-11-23 DIAGNOSIS — R001 Bradycardia, unspecified: Secondary | ICD-10-CM

## 2015-11-23 LAB — BASIC METABOLIC PANEL
Anion gap: 8 (ref 5–15)
BUN: 30 mg/dL — AB (ref 6–20)
CHLORIDE: 109 mmol/L (ref 101–111)
CO2: 25 mmol/L (ref 22–32)
Calcium: 9 mg/dL (ref 8.9–10.3)
Creatinine, Ser: 1.65 mg/dL — ABNORMAL HIGH (ref 0.61–1.24)
GFR calc Af Amer: 38 mL/min — ABNORMAL LOW (ref >60)
GFR calc non Af Amer: 33 mL/min — ABNORMAL LOW (ref >60)
GLUCOSE: 86 mg/dL (ref 65–99)
POTASSIUM: 4.1 mmol/L (ref 3.5–5.1)
Sodium: 142 mmol/L (ref 135–145)

## 2015-11-23 LAB — ECHOCARDIOGRAM COMPLETE
Height: 67 in
WEIGHTICAEL: 2243.2 [oz_av]

## 2015-11-23 LAB — CBC
HCT: 30.3 % — ABNORMAL LOW (ref 39.0–52.0)
HEMOGLOBIN: 9.9 g/dL — AB (ref 13.0–17.0)
MCH: 30.4 pg (ref 26.0–34.0)
MCHC: 32.7 g/dL (ref 30.0–36.0)
MCV: 92.9 fL (ref 78.0–100.0)
PLATELETS: 238 10*3/uL (ref 150–400)
RBC: 3.26 MIL/uL — AB (ref 4.22–5.81)
RDW: 13.9 % (ref 11.5–15.5)
WBC: 7.7 10*3/uL (ref 4.0–10.5)

## 2015-11-23 MED ORDER — APIXABAN 2.5 MG PO TABS: 2.5000 mg | ORAL_TABLET | Freq: Two times a day (BID) | ORAL | Status: DC

## 2015-11-23 MED ORDER — APIXABAN 2.5 MG PO TABS
2.5000 mg | ORAL_TABLET | Freq: Two times a day (BID) | ORAL | Status: DC
  Administered 2015-11-23 – 2015-11-24 (×2): 2.5 mg via ORAL
  Filled 2015-11-23 (×2): qty 1

## 2015-11-23 NOTE — Progress Notes (Addendum)
ANTICOAGULATION CONSULT NOTE - Initial Consult  Pharmacy Consult for Apixaban Indication: atrial fibrillation  No Known Allergies  Patient Measurements: Height: 5\' 7"  (170.2 cm) Weight: 140 lb 3.2 oz (63.594 kg) IBW/kg (Calculated) : 66.1 Heparin Dosing Weight:   Vital Signs: Temp: 97.7 F (36.5 C) (03/27 0609) Temp Source: Oral (03/27 0609) BP: 149/76 mmHg (03/27 0609) Pulse Rate: 59 (03/27 0609)  Labs:  Recent Labs  11/20/15 1408 11/22/15 1857 11/23/15 0413  HGB 10.4*  --  9.9*  HCT 30.5*  --  30.3*  PLT 238  --  238  CREATININE 1.47* 1.67* 1.65*    Estimated Creatinine Clearance: 23 mL/min (by C-G formula based on Cr of 1.65).   Medical History: Past Medical History  Diagnosis Date  . CAD in native artery -->  1994    referred for CABG  . S/P CABG x 4 1994    a) 1994: LIMA-LAD, SVG-RI, SVG-OM, SVG-RCA; b) 09/2006, Persantine Myoview: fixed inferior defect consistent with infarct/scar without peri-infarct ischemia;; 2-D echo: Mild-mod concentric LVH. Normal EF.  Abnormal relaxation, aortic sclerosis with mild-mod AI  . Chronic atrial fibrillation (HCC) 12/15/2012  . Long term (current) use of anticoagulants 12/15/2012    warfarin  . Essential hypertension 05/23/2013  . Dyslipidemia, goal LDL below 70[272.4]      - on statin, followed by primary physician.   . Glaucoma   . History of kidney stones   . H/O seasonal allergies   . Diverticulitis   . C4 cervical fracture (HCC) 2015    with spinal cord contusion - stopped coumadin.  . Acute right MCA stroke (HCC) 09/23/2015, 10/2015    recurrent 2 months later    Medications:  Scheduled:  . clopidogrel  75 mg Oral Daily  . heparin  5,000 Units Subcutaneous 3 times per day  . latanoprost  1 drop Both Eyes QHS  . levothyroxine  75 mcg Oral QAC breakfast  . simvastatin  20 mg Oral q1800    Assessment: 82yo male who presented with AMS on 3/24, CT at that time (-) for acute abnormality.  Repeat CT today  (+)non-hemorrhagic infarct and pt to start Apixaban.  In the past, pt was on Coumadin which had been stopped due to spinal cord contusion.  Dose will require adjustment for age and renal function.  Watch for s/s of bleeding.  Discussed with Neuro- stop Plavix & they will consider adding BASA.  Goal of Therapy:  Prevention of stroke Monitor platelets by anticoagulation protocol: Yes   Plan:  D/C heparin SQ Apixaban 2.5mg  po BID D/C Plavix Watch for s/s of bleeding Pt/family education  Marisue HumbleKendra Nikoletta Varma, PharmD Clinical Pharmacist  [facility]

## 2015-11-23 NOTE — Progress Notes (Signed)
Echocardiogram 2D Echocardiogram has been performed.  Nolon RodBrown, Tony 11/23/2015, 2:16 PM

## 2015-11-23 NOTE — Progress Notes (Signed)
[facility] PROGRESS NOTE  Jacob [name] XBJ:[id number] DOB: Jan 15, 1917 DOA: 11/20/2015  PCP: Eustaquio Boyden, MD  Brief HPI: 82 year old male of Hispanic origin with a past medical history of atrial fibrillation who has been on Plavix, history of coronary artery disease who was recently discharged from [facility] after being admitted for acute stroke. Patient presented with altered mental status. Patient was found to have new acute stroke. Hospitalized for further management  Past medical history:  Past Medical History  Diagnosis Date  . CAD in native artery -->  1994    referred for CABG  . S/P CABG x 4 1994    a) 1994: LIMA-LAD, SVG-RI, SVG-OM, SVG-RCA; b) 09/2006, Persantine Myoview: fixed inferior defect consistent with infarct/scar without peri-infarct ischemia;; 2-D echo: Mild-mod concentric LVH. Normal EF.  Abnormal relaxation, aortic sclerosis with mild-mod AI  . Chronic atrial fibrillation (HCC) 12/15/2012  . Long term (current) use of anticoagulants 12/15/2012    warfarin  . Essential hypertension 05/23/2013  . Dyslipidemia, goal LDL below 70[272.4]      - on statin, followed by primary physician.   . Glaucoma   . History of kidney stones   . H/O seasonal allergies   . Diverticulitis   . C4 cervical fracture (HCC) 2015    with spinal cord contusion - stopped coumadin.  . Acute right MCA stroke (HCC) 09/23/2015, 10/2015    recurrent 2 months later    Consultants: Neurology  Procedures:  Echocardiogram is pending  Carotid Doppler Preliminary report: 1-39% ICA stenosis. Vertebral artery flow is antegrade.   Antibiotics: None  Subjective: Patient seen in his room with his wife and daughter at bedside. He denies any complaints. He feels well. Disappointed that he may not be able to go home today. However, he denies any chest pain, shortness of breath, dizziness, lightheadedness, palpitations, nausea or vomiting.  Objective: Vital Signs  Filed  Vitals:   11/22/15 1334 11/22/15 1843 11/22/15 2117 11/23/15 0609  BP: 171/73 149/65 175/66 149/76  Pulse: 55 61 70 59  Temp: 98 F (36.7 C) 98.2 F (36.8 C) 98.1 F (36.7 C) 97.7 F (36.5 C)  TempSrc: Oral Oral Oral Oral  Resp: Height:      Weight:      SpO2: 100% 98% 99% 97%    Intake/Output Summary (Last 24 hours) at 11/23/15 1258 Last data filed at 11/23/15 1104  Gross per 24 hour  Intake  560.5 ml  Output    500 ml  Net   60.5 ml   Filed Weights   11/20/15 1613  Weight: 63.594 kg (140 lb 3.2 oz)   Telemetry was reviewed. He was noted to have a pause overnight  General appearance: alert, cooperative, appears stated age and no distress Resp: clear to auscultation bilaterally Cardio: S1 and is bradycardic, regular. No S3, S4. No rubs, murmurs, or bruit  GI: soft, non-tender; bowel sounds normal; no masses,  no organomegaly Extremities: extremities normal, atraumatic, no cyanosis or edema Neurologic: Awake and alert. Oriented 3. No cranial nerve deficits noted. Tongue is midline. Motor strength equal bilateral upper and lower extremities.  Lab Results:  Basic Metabolic Panel:  Recent Labs Lab 11/20/15 1008 11/20/15 1408 11/22/15 1857 11/23/15 0413  NA 143  --  140 142  K 4.0  --  4.0 4.1  CL 110  --  109 109  CO2 25  --  22 25  GLUCOSE 96  --  103* 86  BUN  26*  --  31* 30*  CREATININE 1.46* 1.47* 1.67* 1.65*  CALCIUM 9.2  --  8.7* 9.0  MG  --   --  1.9  --     Recent Labs Lab 11/20/15 1408  AMMONIA 36*   CBC:  Recent Labs Lab 11/20/15 1008 11/20/15 1408 11/23/15 0413  WBC 6.8 7.0 7.7  HGB 10.5* 10.4* 9.9*  HCT 32.0* 30.5* 30.3*  MCV 93.6 93.6 92.9  PLT 257 238 238    Studies/Results: Ct Head Wo Contrast  11/23/2015  CLINICAL DATA:  82 year old male with right middle cerebral artery infarct 09/18/2015. Atrial fibrillation. Sudden onset of confusion. Subsequent encounter. EXAM: CT HEAD WITHOUT CONTRAST TECHNIQUE: Contiguous  axial images were obtained from the base of the skull through the vertex without intravenous contrast. COMPARISON:  11/20/2015 MR and CT. FINDINGS: Expected evolution of acute nonhemorrhagic moderate size left frontal lobe/ anterior left peri operculum infarct (involves portion of the anterior left subinsular region). Prominent small vessel disease changes. Remote small basal ganglia infarcts more notable on the left. Global atrophy without hydrocephalus. Vascular calcifications. IMPRESSION: Expected evolution of acute nonhemorrhagic moderate size left frontal lobe/ anterior left peri operculum infarct. Electronically Signed   By: Lacy Duverney M.D.   On: 11/23/2015 07:16    Medications:  Scheduled: . apixaban  2.5 mg Oral BID  . latanoprost  1 drop Both Eyes QHS  . levothyroxine  75 mcg Oral QAC breakfast  . simvastatin  20 mg Oral q1800   Continuous: . sodium chloride 10 mL/hr at 11/22/15 1116   NAT:FTDDUKGURKYHC **OR** acetaminophen, hydrALAZINE, HYDROcodone-acetaminophen, morphine injection  Assessment/Plan:  Active Problems:   Chronic atrial fibrillation (HCC)   CAD in native artery -->  CABG x4 in 1994   S/P CABG x 4 (1994) : LIMA-LAD, SVG-RI, SVG-OM, SVG-RCA   Dyslipidemia, goal LDL below 70 - on statin, followed by primary physician.   Essential hypertension   Hypothyroidism   Dysphagia   Acute right MCA stroke (HCC)   Anemia   Confusion   Acute CVA (cerebrovascular accident) (HCC)    Acute right MCA stroke Neurology is following. Stroke workup is in progress. Echocardiogram is pending. CT head was repeated which shows expected evolution of stroke. No hemorrhage. So, as recommended by neurology, who has discussed anticoagulation in detail with the patient and the family, we will initiate anticoagulation. Discussed with pharmacy. The liquids will be initiated. Considering better risk profile. Stop Plavix. PT and OT is following. Home health has been recommended. Patient is on  statin. LDL was 49 in January.  Chronic atrial fibrillation with bradycardia and pauses Heart rate noted to drop down into the 40s overnight. Pause has been present. He is on carvedilol and also on Timolol eye drops. Due to significant drop in heart rate we will hold both of these agents. This has been explained to the patient and his family in detail. He'll be monitored for an additional 24 hours. He will need to be followed closely in the cardiology clinic. Apparently he's been off of anticoagulation since 2015 due to a fall resulting in hematoma. Chads 2 vascular score is 8. Initiated on oral anticoagulation as discussed above.  Essential hypertension Blood pressure was elevated at the time of admission. Permissive hypertension was allowed. Patient is back on his oral agents currently.  Chronic kidney disease stage III Monitor renal function closely. Stable.  Hypothyroidism Continue Synthroid  History of coronary artery disease status post CABG in 1994 Stable. Continue home medications.  DVT Prophylaxis: Subcutaneous heparin    Code Status: Full code  Family Communication: Discussed with the patient and his daughter Disposition Plan: Await echocardiogram. Monitor on telemetry for 24 hours. Hopefully discharge tomorrow to home with home health.    LOS: 2 days   [facility] LPKRISHNAN,Holden Maniscalco  [facility] Pager [phone] 11/23/2015, 12:58 PM  If 7PM-7AM, please contact night-coverage at [email or web address], password [email or web address]

## 2015-11-23 NOTE — Progress Notes (Addendum)
NCM awaiting  Benefit check for Eliquis 2.5 mg  ELIQUIS 2.5 MG BID FOR 30 DAY SUPPLY   COVER- YES  CO-PAY-$ 45.00  TIER-3 DRUG  PRIOR APPROVAL- NO  [facility]

## 2015-11-23 NOTE — Progress Notes (Addendum)
STROKE TEAM PROGRESS NOTE   SUBJECTIVE (INTERVAL HISTORY) His daughter and wife are at the bedside.  Overall his condition is almost back to baseline as per family. Daughter acting as an interpreter/  Patient is without complaints. He was on coumadin for afib, but then off coumadin after a fall in 07/2014 causing cervical spine fracture, but never restarted on coumadin but put on ASA. 08/2015 had TIA but only changed from ASA to plavix.   OBJECTIVE Temp:  [97.7 F (36.5 C)-98.1 F (36.7 C)] 98.1 F (36.7 C) (03/27 2118) Pulse Rate:  [59-67] 67 (03/27 2118) Cardiac Rhythm:  [-] Atrial fibrillation (03/27 2118) Resp:  [16] 16 (03/27 2118) BP: (149-157)/(76-82) 157/82 mmHg (03/27 2118) SpO2:  [97 %-100 %] 100 % (03/27 2118)  CBC:   Recent Labs Lab 11/20/15 1408 11/23/15 0413  WBC 7.0 7.7  HGB 10.4* 9.9*  HCT 30.5* 30.3*  MCV 93.6 92.9  PLT 238 238    Basic Metabolic Panel:   Recent Labs Lab 11/22/15 1857 11/23/15 0413  NA 140 142  K 4.0 4.1  CL 109 109  CO2 22 25  GLUCOSE 103* 86  BUN 31* 30*  CREATININE 1.67* 1.65*  CALCIUM 8.7* 9.0  MG 1.9  --     Lipid Panel:     Component Value Date/Time   CHOL 122 09/23/2015 1911   CHOL 130 07/03/2013   TRIG 147 09/23/2015 1911   TRIG 123 07/03/2013   HDL 44 09/23/2015 1911   CHOLHDL 2.8 09/23/2015 1911   VLDL 29 09/23/2015 1911   LDLCALC 49 09/23/2015 1911   LDLCALC 64 07/03/2013   HgbA1c: No results found for: HGBA1C Urine Drug Screen: No results found for: LABOPIA, COCAINSCRNUR, LABBENZ, AMPHETMU, THCU, LABBARB    IMAGING I have personally reviewed the radiological images below and agree with the radiology interpretations.   Dg Chest 2 View  11/20/2015   Mild bibasilar atelectasis. No edema or consolidation. Mild cardiac enlargement. Bones are osteoporotic.   Ct Head Wo Contrast 11/20/2015   No acute finding by CT. Atrophy and chronic small vessel ischemic changes.   Mr Brain Wo Contrast 11/20/2015   1.  Acute anterior division left MCA infarct. No associated mass effect or hemorrhage.  2. Superimposed small acute lacunar infarct in the posterior MCA territory, near the superior sensory strip.  3. Elsewhere stable MRI appearance of the brain since January.   CUS - Bilateral: 1-39% ICA stenosis. Vertebral artery flow is antegrade.  TTE - - Left ventricle: The cavity size was normal. Wall thickness was  increased in a pattern of moderate LVH. Systolic function was  normal. The estimated ejection fraction was in the range of 55%  to 60%. - Aortic valve: There was mild regurgitation. - Mitral valve: Calcified annulus. Mildly thickened leaflets .  There was mild regurgitation. - Left atrium: The atrium was severely dilated. - Pulmonary arteries: PA peak pressure: 43 mm Hg (S).   PHYSICAL EXAM General - Well nourished, well developed, in NAD   Cardiovascular - irregular rate and rhythm Pulmonary: CTA Abdomen: NT, ND, normal bowel sounds Extremities: No C/C/E  Neurological Exam Mental Status: oriented to all except year; knew president Speech:  Fluent; no dysarthria Followed commands  Cranial Nerves:  PERRL; EOMI; visual fields full, face grossly symmetric, hearing grossly intact; shrug symmetric and tongue midline  Motor Exam:  Tone:  Within normal limits; Strength: 5/5 throughout  Sensory: Intact to light touch throughout  Coordination:  Intact finger to nose  Gait: Deferred   ASSESSMENT/PLAN [name] is a 82 y.o. male with history of coronary artery disease with previous coronary artery bypass graft surgery, chronic atrial fibrillation on warfarin in the past stopped secondary to a spinal cord contusion, essential hypertension, dyslipidemia, history of a C4 cervical fracture, history of an acute right middle cerebral artery stroke in January 2017. presenting with confusion. He did not receive IV t-PA due to recent stroke in January 2017   Stroke:  Dominant  Left MCA infarct probably embolic secondary to atrial fibrillation without anticoagulation.  Resultant  confusion  MRI  acute anterior division left MCA infarction   MRA  not performed  Carotid Doppler  1-39% ICA stenosis. Vertebral artery flow is antegrade.   2D Echo EF 60-65%.   LDL - 49  HgbA1c 10.4  VTE prophylaxis - Subcutaneous heparin Diet Heart Room service appropriate?: Yes; Fluid consistency:: Thin  clopidogrel 75 mg daily prior to admission, now on clopidogrel 75 mg daily. Due to afib as the major stroke risk factor, would recommend eliquis 2.5mg  bid for stroke prevention. plavix can be discontinued.  Patient counseled to be compliant with his antithrombotic medications  Ongoing aggressive stroke risk factor management  Therapy recommendations: Home health physical therapy recommended  Disposition: pending  Continue to follow up with Dr. Everlena [name] in [facility]  Hypertension  Stable Permissive hypertension (OK if < 220/120) but gradually normalize in 5-7 days  Hyperlipidemia  Home meds: Zocor 20 mg daily  resumed in hospital  LDL 49, goal < 70  Continue statin at discharge  Diabetes  HgbA1c 10.4, goal < 7.0  Uncontrolled  CBG monitoring  Other Stroke Risk Factors  Advanced age  Hx stroke/TIA  Coronary artery disease  Other Active Problems  Elevated creatinine 1.47  Anemia hemoglobin 10.4    hematocrit 30.5  Hospital day # 2  Neurology will sign off. Please call with questions. Pt will follow up with Dr. Everlena [name] at [facility] in about 2 months. Thanks for the consult.  Marvel Plan, MD PhD Stroke Neurology 11/23/2015 11:21 PM     To contact Stroke Continuity provider, please refer to [email or web address]. After hours, contact General Neurology

## 2015-11-23 NOTE — Consult Note (Signed)
   [facility] CM Inpatient Consult   11/23/2015  LASTER APPLING 04-25-1917 [phone] Consult received to assess for care management services and for in network home health agencies by inpatient RNCM.  [employer] Concierge number given.     Met with the patient and family, patient was asleep and spoke with the daughter at the bedside regarding the benefits of [place] Management services for [employer].  Explained that [place] Management is a covered benefit of insurance. Review information for [facility] Care Management and a brochure was provided with contact information.  Explained that [place] Management does not interfere with or replace any services arranged by the inpatient care management staff.  Patient's daughter just wanted the brochure and information and it was given.  For any changes, please contact: Natividad Brood, RN BSN [facility] Liaison  [phone] business mobile phone Toll free office [phone]

## 2015-11-23 NOTE — Discharge Instructions (Addendum)

## 2015-11-24 ENCOUNTER — Telehealth: Payer: Self-pay | Admitting: Cardiology

## 2015-11-24 ENCOUNTER — Telehealth: Payer: Self-pay | Admitting: Cardiovascular Disease

## 2015-11-24 LAB — CBC
HCT: 30.5 % — ABNORMAL LOW (ref 39.0–52.0)
HEMOGLOBIN: 9.9 g/dL — AB (ref 13.0–17.0)
MCH: 30.2 pg (ref 26.0–34.0)
MCHC: 32.5 g/dL (ref 30.0–36.0)
MCV: 93 fL (ref 78.0–100.0)
PLATELETS: 246 10*3/uL (ref 150–400)
RBC: 3.28 MIL/uL — AB (ref 4.22–5.81)
RDW: 13.9 % (ref 11.5–15.5)
WBC: 6.7 10*3/uL (ref 4.0–10.5)

## 2015-11-24 LAB — HEMOGLOBIN A1C
HEMOGLOBIN A1C: 5.6 % (ref 4.8–5.6)
MEAN PLASMA GLUCOSE: 114 mg/dL

## 2015-11-24 LAB — BASIC METABOLIC PANEL
Anion gap: 7 (ref 5–15)
BUN: 31 mg/dL — AB (ref 6–20)
CALCIUM: 8.9 mg/dL (ref 8.9–10.3)
CO2: 24 mmol/L (ref 22–32)
CREATININE: 1.64 mg/dL — AB (ref 0.61–1.24)
Chloride: 109 mmol/L (ref 101–111)
GFR calc non Af Amer: 33 mL/min — ABNORMAL LOW (ref >60)
GFR, EST AFRICAN AMERICAN: 39 mL/min — AB (ref >60)
Glucose, Bld: 91 mg/dL (ref 65–99)
Potassium: 3.8 mmol/L (ref 3.5–5.1)
SODIUM: 140 mmol/L (ref 135–145)

## 2015-11-24 MED ORDER — APIXABAN 2.5 MG PO TABS: 2.5000 mg | ORAL_TABLET | Freq: Two times a day (BID) | ORAL | Status: DC

## 2015-11-24 NOTE — Telephone Encounter (Signed)
Pt needs PHOS Appt please.

## 2015-11-24 NOTE — Progress Notes (Signed)
   11/24/15 1152  PT Visit Information  Last PT Received On 11/24/15  Assistance Needed +1  Reason Eval/Treat Not Completed Other (comment) (Pt currently going over paper work with Rn, reports no need for therapy before d/c home, stair training occured on previous intervention.  )  History of Present Illness 82 yo male with L MCA infarct after R MCA infarct Jan 2017,  home with his wife and daughter usually, was independent on no AD prior to this indoors, SPC outdoors.  PMHx:  osteoporosis, A-fib   Joycelyn RuaAimee Jemina Scahill, PTA pager [phone]

## 2015-11-24 NOTE — Progress Notes (Signed)
NURSING PROGRESS NOTE  Normand SloopRamon F Mastandrea [id number] Discharge Data: 11/24/2015 10:58 AM Attending Provider: Osvaldo ShipperGokul Krishnan, MD JXB:JYNWGNPCP:Javier Sharen HonesGutierrez, MD     Normand Sloopamon F Vespa to be D/C'd Home per MD order.  Discussed with the patient the After Visit Summary and all questions fully answered. All IV's discontinued with no bleeding noted. All belongings returned to patient for patient to take home.   Last Vital Signs:  Blood pressure 141/63, pulse 63, temperature 97.8 F (36.6 C), temperature source Oral, resp. rate 18, height 5\' 7"  (1.702 m), weight 63.594 kg (140 lb 3.2 oz), SpO2 99 %.  Discharge Medication List   Medication List    STOP taking these medications        carvedilol 3.125 MG tablet  Commonly known as:  COREG     clopidogrel 75 MG tablet  Commonly known as:  PLAVIX     lisinopril 20 MG tablet  Commonly known as:  PRINIVIL,ZESTRIL     timolol 0.5 % ophthalmic solution  Commonly known as:  TIMOPTIC      TAKE these medications        apixaban 2.5 MG Tabs tablet  Commonly known as:  ELIQUIS  Take 1 tablet (2.5 mg total) by mouth 2 (two) times daily.     BOOST HIGH PROTEIN PO  Take 1 Bottle by mouth daily as needed (Dietary supplement).     latanoprost 0.005 % ophthalmic solution  Commonly known as:  XALATAN  Place 1 drop into both eyes at bedtime.     levothyroxine 75 MCG tablet  Commonly known as:  SYNTHROID  Take 1 tablet (75 mcg total) by mouth daily before breakfast.     simvastatin 20 MG tablet  Commonly known as:  ZOCOR  Take 1 tablet (20 mg total) by mouth daily.         Bennie Pieriniyndi Hazelee Harbold, RN

## 2015-11-24 NOTE — Telephone Encounter (Signed)
error 

## 2015-11-24 NOTE — Discharge Summary (Signed)
[facility]  Physician Discharge Summary   Patient ID: Jacob [name] MRN: [id number] DOB/AGE: 1916/11/10 82 y.o.  Admit date: 11/20/2015 Discharge date: 11/24/2015  PCP: Eustaquio Boyden, MD  DISCHARGE DIAGNOSES:  Active Problems:   Chronic atrial fibrillation (HCC)   CAD in native artery -->  CABG x4 in 1994   S/P CABG x 4 (1994) : LIMA-LAD, SVG-RI, SVG-OM, SVG-RCA   Dyslipidemia, goal LDL below 70 - on statin, followed by primary physician.   Essential hypertension   Hypothyroidism   Dysphagia   Acute right MCA stroke (HCC)   Anemia   Confusion   Acute CVA (cerebrovascular accident) (HCC)   Stroke (cerebrum) (HCC)   RECOMMENDATIONS FOR OUTPATIENT FOLLOW UP: 1. Needs close follow-up with cardiology as discussed below (Coreg has been held along with lisinopril.)   DISCHARGE CONDITION: fair  Diet recommendation: Heart healthy  Filed Weights   11/20/15 1613  Weight: 63.594 kg (140 lb 3.2 oz)    INITIAL HISTORY: 82 year old male of Hispanic origin with a past medical history of atrial fibrillation who has been on Plavix, history of coronary artery disease who was recently discharged from [facility] after being admitted for acute stroke. Patient presented with altered mental status. Patient was found to have new acute stroke. Hospitalized for further management.  Consultants: Neurology  Procedures:  Echocardiogram  Study Conclusions - Left ventricle: The cavity size was normal. Wall thickness was increased in a pattern of moderate LVH. Systolic function was normal. The estimated ejection fraction was in the range of 55% to 60%. - Aortic valve: There was mild regurgitation. - Mitral valve: Calcified annulus. Mildly thickened leaflets. There was mild regurgitation. - Left atrium: The atrium was severely dilated. - Pulmonary arteries: PA peak pressure: 43 mm Hg (S).  Carotid Doppler Preliminary report: 1-39% ICA stenosis. Vertebral artery flow is  antegrade.   HOSPITAL COURSE:   Acute right MCA stroke Patient presented with altered mental status and is found to have new stroke. Patient was seen by neurology. He underwent stroke workup. Neurology had a long discussion with the patient's family including daughter. Patient was placed on oral anticoagulation. He used to be on warfarin however, warfarin was discontinued in 2015 due to a fall resulting in hematoma. Plavix has been discontinued. Eliquis has been initiated. Home health has been recommended by PT and OT. Patient is already on a statin. LDL was 49 in January.  Chronic atrial fibrillation with bradycardia and pauses Heart rate noted to drop down into the 40s overnight with pauses. He is on carvedilol and also on Timolol eye drops. Due to significant drop in heart rate both of these agents have been held. This has been explained to the patient and his family in detail. Heart rate has improved. He will need to be seen by cardiology next few days to monitor her heart rate and discuss further management including resuming beta blocker. Chads 2 vascular score is 8. Initiated on oral anticoagulation as discussed above.  Essential hypertension Blood pressure was elevated at the time of admission. Permissive hypertension was allowed. Patient is back on his oral agents currently. ACE inhibitor has been held due to elevated creatinine.  Chronic kidney disease stage III Monitor renal function closely. Stable. Needs to be monitored as outpatient.  Hypothyroidism Continue Synthroid  History of coronary artery disease status post CABG in 1994 Stable. Continue home medications.  Overall improved. Patient denies any chest pain, shortness of breath, palpitations, lightheadedness or dizziness. No nausea, vomiting. Patient very  keen on going home. All of the above discussed with patient's daughter. All questions answered. Close follow-up with cardiology. Okay for discharge.   PERTINENT  LABS:  The results of significant diagnostics from this hospitalization (including imaging, microbiology, ancillary and laboratory) are listed below for reference.    Microbiology: Recent Results (from the past 240 hour(s))  Urine culture     Status: None   Collection Time: 11/21/15  3:15 AM  Result Value Ref Range Status   Specimen Description URINE, CATHETERIZED  Final   Special Requests NONE  Final   Culture MULTIPLE SPECIES PRESENT, SUGGEST RECOLLECTION  Final   Report Status 11/22/2015 FINAL  Final     Labs: Basic Metabolic Panel:  Recent Labs Lab 11/20/15 1008 11/20/15 1408 11/22/15 1857 11/23/15 0413 11/24/15 0526  NA 143  --  140 142 140  K 4.0  --  4.0 4.1 3.8  CL 110  --  109 109 109  CO2 25  --  GLUCOSE 96  --  103* 86 91  BUN 26*  --  31* 30* 31*  CREATININE 1.46* 1.47* 1.67* 1.65* 1.64*  CALCIUM 9.2  --  8.7* 9.0 8.9  MG  --   --  1.9  --   --     Recent Labs Lab 11/20/15 1408  AMMONIA 36*   CBC:  Recent Labs Lab 11/20/15 1008 11/20/15 1408 11/23/15 0413 11/24/15 0526  WBC 6.8 7.0 7.7 6.7  HGB 10.5* 10.4* 9.9* 9.9*  HCT 32.0* 30.5* 30.3* 30.5*  MCV 93.6 93.6 92.9 93.0  PLT 257 238 238 246    IMAGING STUDIES Dg Chest 2 View  11/20/2015  CLINICAL DATA:  Confusion.  Hypertension EXAM: CHEST  2 VIEW COMPARISON:  April 12, 2007 FINDINGS: There is mild bibasilar atelectatic change. There is no edema or consolidation. Heart is mildly enlarged with pulmonary vascularity within normal limits. Patient is status post internal mammary bypass grafting. There is degenerative change in the thoracic spine. Bones are osteoporotic. IMPRESSION: Mild bibasilar atelectasis. No edema or consolidation. Mild cardiac enlargement. Bones are osteoporotic. Electronically Signed   By: Bretta Bang III M.D.   On: 11/20/2015 10:26   Ct Head Wo Contrast  11/23/2015  CLINICAL DATA:  82 year old male with right middle cerebral artery infarct 09/18/2015. Atrial  fibrillation. Sudden onset of confusion. Subsequent encounter. EXAM: CT HEAD WITHOUT CONTRAST TECHNIQUE: Contiguous axial images were obtained from the base of the skull through the vertex without intravenous contrast. COMPARISON:  11/20/2015 MR and CT. FINDINGS: Expected evolution of acute nonhemorrhagic moderate size left frontal lobe/ anterior left peri operculum infarct (involves portion of the anterior left subinsular region). Prominent small vessel disease changes. Remote small basal ganglia infarcts more notable on the left. Global atrophy without hydrocephalus. Vascular calcifications. IMPRESSION: Expected evolution of acute nonhemorrhagic moderate size left frontal lobe/ anterior left peri operculum infarct. Electronically Signed   By: Lacy Duverney M.D.   On: 11/23/2015 07:16   Ct Head Wo Contrast  11/20/2015  CLINICAL DATA:  Confusion and altered mental status beginning this morning. Stroke in January. EXAM: CT HEAD WITHOUT CONTRAST TECHNIQUE: Contiguous axial images were obtained from the base of the skull through the vertex without intravenous contrast. COMPARISON:  MRI 09/24/2015.  CT 09/23/2015. FINDINGS: The brain shows generalized atrophy. There chronic small-vessel ischemic changes affecting the hemispheric white matter, basal ganglia and thalami. No sign of acute infarction, mass lesion, hemorrhage, hydrocephalus or extra-axial collection. Sinuses, middle ears and mastoids  are clear. There is atherosclerotic calcification of the major vessels at the base of the brain. IMPRESSION: No acute finding by CT. Atrophy and chronic small vessel ischemic changes. Electronically Signed   By: Paulina Fusi M.D.   On: 11/20/2015 11:47   Mr Brain Wo Contrast  11/20/2015  CLINICAL DATA:  82 year old male with confusion since 0740 hours today. Initial encounter. Right MCA lacunar infarct in January. EXAM: MRI HEAD WITHOUT CONTRAST TECHNIQUE: Multiplanar, multiecho pulse sequences of the brain and surrounding  structures were obtained without intravenous contrast. COMPARISON:  Head CT without contrast 1144 hours today. Brain MRI 09/24/2015. FINDINGS: Major intracranial vascular flow voids are stable. However, there is restricted diffusion involving the anterior left frontal lobe, epicenter at the left middle frontal gyrus. 4 cm area of cortex and subcortical white matter affected. Mild cytotoxic edema with no associated hemorrhage or mass effect. Superimposed 3-4 mm acute cortical restricted diffusion in the superior left parietal lobe abutting the sensory strip (series 4, image 42). Expected resolution of the small areas of restricted diffusion seen in January. No other restricted diffusion. Elsewhere stable gray and white matter signal since January. No midline shift, mass effect, evidence of mass lesion, ventriculomegaly, extra-axial collection or acute intracranial hemorrhage. Cervicomedullary junction and pituitary are within normal limits. Stable paranasal sinuses, mastoids, orbits soft tissues and scalp soft tissues. Normal bone marrow signal. Stable visualized cervical spine. IMPRESSION: 1. Acute anterior division left MCA infarct. No associated mass effect or hemorrhage. 2. Superimposed small acute lacunar infarct in the posterior MCA territory, near the superior sensory strip. 3. Elsewhere stable MRI appearance of the brain since January. Electronically Signed   By: Odessa Fleming M.D.   On: 11/20/2015 16:22    DISCHARGE EXAMINATION: Filed Vitals:   11/23/15 0609 11/23/15 2118 11/24/15 0450 11/24/15 0809  BP: 149/76 157/82 143/63 141/63  Pulse: 59 67 61 63  Temp: 97.7 F (36.5 C) 98.1 F (36.7 C) 98.3 F (36.8 C) 97.8 F (36.6 C)  TempSrc: Oral Oral Oral Oral  Resp: 16 16 18 18   Height:      Weight:      SpO2: 97% 100% 100% 99%   General appearance: alert, cooperative, appears stated age and no distress Resp: clear to auscultation bilaterally Cardio: regular rate and rhythm, S1, S2 normal, no  murmur, click, rub or gallop GI: soft, non-tender; bowel sounds normal; no masses,  no organomegaly Extremities: extremities normal, atraumatic, no cyanosis or edema   DISPOSITION: Home with daughter  Discharge Instructions    Ambulatory referral to Neurology    Complete by:  As directed   An appointment is requested in approximately: 8 weeks     Call MD for:  difficulty breathing, headache or visual disturbances    Complete by:  As directed      Call MD for:  extreme fatigue    Complete by:  As directed      Call MD for:  persistant dizziness or light-headedness    Complete by:  As directed      Call MD for:  persistant nausea and vomiting    Complete by:  As directed      Call MD for:  redness, tenderness, or signs of infection (pain, swelling, redness, odor or green/yellow discharge around incision site)    Complete by:  As directed      Call MD for:  temperature >100.4    Complete by:  As directed      Diet - low sodium  heart healthy    Complete by:  As directed      Discharge instructions    Complete by:  As directed   Please do not take Coreg, Lisinopril and Timolol eye drops for now. Your cardiologist will tell you if you can resume these medications depending on your heart rate. Please stop taking Plavix as you are now on Eliquis.  You were cared for by a hospitalist during your hospital stay. If you have any questions about your discharge medications or the care you received while you were in the hospital after you are discharged, you can call the unit and asked to speak with the hospitalist on call if the hospitalist that took care of you is not available. Once you are discharged, your primary care physician will handle any further medical issues. Please note that NO REFILLS for any discharge medications will be authorized once you are discharged, as it is imperative that you return to your primary care physician (or establish a relationship with a primary care physician if you  do not have one) for your aftercare needs so that they can reassess your need for medications and monitor your lab values. If you do not have a primary care physician, you can call [phone] for a physician referral.     Increase activity slowly    Complete by:  As directed            ALLERGIES: No Known Allergies   Discharge Medication List as of 11/24/2015 11:04 AM    START taking these medications   Details  apixaban (ELIQUIS) 2.5 MG TABS tablet Take 1 tablet (2.5 mg total) by mouth 2 (two) times daily., Starting 11/24/2015, Until Discontinued, Print      CONTINUE these medications which have NOT CHANGED   Details  latanoprost (XALATAN) 0.005 % ophthalmic solution Place 1 drop into both eyes at bedtime., Starting 04/11/2013, Until Discontinued, Historical Med    levothyroxine (SYNTHROID) 75 MCG tablet Take 1 tablet (75 mcg total) by mouth daily before breakfast., Starting 10/07/2015, Until Discontinued, Normal    Nutritional Supplements (BOOST HIGH PROTEIN PO) Take 1 Bottle by mouth daily as needed (Dietary supplement)., Until Discontinued, Historical Med    simvastatin (ZOCOR) 20 MG tablet Take 1 tablet (20 mg total) by mouth daily., Starting 11/12/2015, Until Discontinued, Normal      STOP taking these medications     carvedilol (COREG) 3.125 MG tablet      clopidogrel (PLAVIX) 75 MG tablet      lisinopril (PRINIVIL,ZESTRIL) 20 MG tablet      timolol (TIMOPTIC) 0.5 % ophthalmic solution        Follow-up Information    Follow up with Cira ServantAdam Robert Jaffe, DO. Schedule an appointment as soon as possible for a visit in 2 months.   Specialty:  Neurology   Contact information:   [address] [place] [id number] [phone]       Follow up with Marykay LexHARDING, DAVID W, MD.   Specialty:  Cardiology   Why:  his office will call with time and date of appointment   Contact information:   [address] [place] [address] [phone]        Follow up with Eustaquio BoydenJavier Gutierrez, MD. Schedule an appointment as soon as possible for a visit in 1 week.   Specialty:  Family Medicine   Why:  post hospitalization follow up   Contact information:   [address] [place]  [place] [phone]       Follow up with [facility].   Specialty:  Home Health Services   Why:  HHPT has been arranged and you should receive a call from representative within 12-24 hours to set up initial visit.    Contact information:   [address] [place] [address] [phone]       TOTAL DISCHARGE TIME: 35 minutes  [facility]  [facility] Pager [phone]  11/24/2015, 1:55 PM

## 2015-11-24 NOTE — Care Management Note (Signed)
Case Management Note  Patient Details  Name: Jacob [name] MRN: [id number] Date of Birth: [birth year]  Subjective/Objective:   82 y.o. [name]  Admitted 11/20/2015 for Chronic Atrial Fibrillation. Pt has been given [employer] Discount Card. CM spoke with Minda DittoEdwina, [place] for [facility]                 Who accepts this referral for HHPT. CM spoke with daughter at bedside to confirm this choice and make her aware of possible delay of 1 week which she is more than willing to accept. No further CM needs at this time.   Action/Plan: Anticipate discharge home today. No further CM needs but will be available should additional discharge needs arise.   Expected Discharge Date:                  Expected Discharge Plan:  Home w Home Health Services  In-House Referral:     Discharge planning Services  CM Consult  Post Acute Care Choice:  Home Health Choice offered to:     DME Arranged:    DME Agency:     HH Arranged:  PT HH [facility]  [facility]  Status of Service:  In process, will continue to follow  Medicare Important Message Given:    Date Medicare IM Given:    Medicare IM give by:    Date Additional Medicare IM Given:    Additional Medicare Important Message give by:     If discussed at Long Length of Stay Meetings, dates discussed:    Additional Comments:  Jacob [name], Jacob Sparacino M, RN 11/24/2015, 11:03 AM

## 2015-11-24 NOTE — Care Management Important Message (Signed)
Important Message  Patient Details  Name: Normand SloopRamon F Montee MRN: [id number] Date of Birth: 1917-08-19   Medicare Important Message Given:  Yes    Royelle Hinchman P Coral Soler 11/24/2015, 12:53 PM

## 2015-11-26 ENCOUNTER — Telehealth: Payer: Self-pay | Admitting: *Deleted

## 2015-11-26 NOTE — Telephone Encounter (Signed)
Transitional care call attempted.  Left message to return call. 

## 2015-11-26 NOTE — Telephone Encounter (Signed)
Transition Care Management Follow-up Telephone Call   Date discharged? 11/24/15   How have you been since you were released from the hospital? Doing well - confusion resolved.   Do you understand why you were in the hospital? yes   Do you understand the discharge instructions? yes   Where were you discharged to? home   Items Reviewed:  Medications reviewed: yes  Allergies reviewed: yes  Dietary changes reviewed: heart healthy diet  Referrals reviewed: cardiology - 4/5, neurology, home health/PT   Functional Questionnaire:   Activities of Daily Living (ADLs):   He states they are independent in the following: ambulation, bathing and hygiene, feeding, continence, grooming, toileting and dressing States they require assistance with the following: none   Any transportation issues/concerns?: no   Any patient concerns? ?need for PT - no PT while inpatien   Confirmed importance and date/time of follow-up visits scheduled yes, 11/30/15 @ 1100   Provider Appointment booked with Eustaquio BoydenJavier Gutierrez, MD  Confirmed with patient if condition begins to worsen call PCP or go to the ER.  Patient was given the office number and encouraged to call back with question or concerns.  : yes

## 2015-11-30 ENCOUNTER — Ambulatory Visit: Payer: PPO | Admitting: Family Medicine

## 2015-11-30 ENCOUNTER — Telehealth: Payer: Self-pay | Admitting: *Deleted

## 2015-11-30 ENCOUNTER — Encounter: Payer: Self-pay | Admitting: Family Medicine

## 2015-11-30 ENCOUNTER — Ambulatory Visit (INDEPENDENT_AMBULATORY_CARE_PROVIDER_SITE_OTHER): Payer: PPO | Admitting: Family Medicine

## 2015-11-30 VITALS — BP 138/76 | HR 88 | Temp 97.4°F | Wt 143.2 lb

## 2015-11-30 DIAGNOSIS — E785 Hyperlipidemia, unspecified: Secondary | ICD-10-CM

## 2015-11-30 DIAGNOSIS — R3915 Urgency of urination: Secondary | ICD-10-CM

## 2015-11-30 DIAGNOSIS — I482 Chronic atrial fibrillation, unspecified: Secondary | ICD-10-CM

## 2015-11-30 DIAGNOSIS — D649 Anemia, unspecified: Secondary | ICD-10-CM | POA: Diagnosis not present

## 2015-11-30 DIAGNOSIS — E039 Hypothyroidism, unspecified: Secondary | ICD-10-CM

## 2015-11-30 DIAGNOSIS — I63511 Cerebral infarction due to unspecified occlusion or stenosis of right middle cerebral artery: Secondary | ICD-10-CM

## 2015-11-30 DIAGNOSIS — IMO0002 Reserved for concepts with insufficient information to code with codable children: Secondary | ICD-10-CM

## 2015-11-30 DIAGNOSIS — I6930 Unspecified sequelae of cerebral infarction: Secondary | ICD-10-CM

## 2015-11-30 DIAGNOSIS — I10 Essential (primary) hypertension: Secondary | ICD-10-CM | POA: Diagnosis not present

## 2015-11-30 NOTE — Progress Notes (Signed)
Pre visit review using our clinic review tool, if applicable. No additional management support is needed unless otherwise documented below in the visit note. 

## 2015-11-30 NOTE — Assessment & Plan Note (Addendum)
Statin atorvastatin changed to simvastatin by neuro - continue this. LDL at goal <70.

## 2015-11-30 NOTE — Assessment & Plan Note (Signed)
Now back on eliquis. F/u scheduled with cardiology later this week. Today rate controlled, only slightly irregular today.

## 2015-11-30 NOTE — Patient Instructions (Addendum)
Home PT referral placed today. Pass by Allison's office to set this up.  labwork today. Return in 1 mo for f/u visit.

## 2015-11-30 NOTE — Telephone Encounter (Signed)
Jacob [name] attempted to set-up PT for patient and he advised that he no longer wanted to pursue it per his daughter who was translating for him. She said he changed his mind after his OV.

## 2015-11-30 NOTE — Progress Notes (Signed)
BP 138/76 mmHg  Pulse 88  Temp(Src) 97.4 F (36.3 C) (Oral)  Wt 143 lb 4 oz (64.978 kg)   CC: hosp f/u visit  Subjective:    Patient ID: Jacob [name], male    DOB: [birth year], 82 y.o.   MRN: [id number]  HPI: Jacob [name] is a 82 y.o. male presenting on 11/30/2015 for Follow-up   Presents with wife and daughter.   Records reviewed. Recent hospitalization for acute R MCA stroke, second in last 3 months. Presented with confusion and slurred speech. Echo showed EF 55-60%, severely dilated L atrium. Carotid with low grade bilateral stenosis. See my prior note for details. Prior on warfarin which was stopped after a fall with spinal cord contusion. Prior to this hospitalization he was on plavix which was subsequently changed to eliquis. During hospitalization, HR dropped to 40s with pauses. Carvedilol was held. Timolol eye drops were held. ACEI held due to elevated creatinine. Not currently on aspirin. Pt overall feeling well, denies any pain since she's been home.  Neither pt nor wife drive.   Has f/u appt in 2 days with Dr Elissa HeftyHarding's PA.   HH PT/OT Frances Furbish([facility]) has not contacted pt yet.   Endorses some urinary accidents. Nocturia 3x/night. Strong stream.   Admit date: 11/20/2015 Discharge date: 11/24/2015 hosp f/u phone call: 11/26/2015  DISCHARGE DIAGNOSES:  Active Problems:  Chronic atrial fibrillation (HCC)  CAD in native artery --> CABG x4 in 1994  S/P CABG x 4 (1994) : LIMA-LAD, SVG-RI, SVG-OM, SVG-RCA  Dyslipidemia, goal LDL below 70 - on statin, followed by primary physician.  Essential hypertension  Hypothyroidism  Dysphagia  Acute right MCA stroke (HCC)  Anemia  Confusion  Acute CVA (cerebrovascular accident) (HCC)  Stroke (cerebrum) (HCC)  RECOMMENDATIONS FOR OUTPATIENT FOLLOW UP: 1. Needs close follow-up with cardiology as discussed below (Coreg has been held along with lisinopril.)  DISCHARGE CONDITION: fair  Diet recommendation: Heart  healthy  Relevant past medical, surgical, family and social history reviewed and updated as indicated. Interim medical history since our last visit reviewed. Allergies and medications reviewed and updated. Current Outpatient Prescriptions on File Prior to Visit  Medication Sig  . apixaban (ELIQUIS) 2.5 MG TABS tablet Take 1 tablet (2.5 mg total) by mouth 2 (two) times daily.  Marland Kitchen. latanoprost (XALATAN) 0.005 % ophthalmic solution Place 1 drop into both eyes at bedtime.  Marland Kitchen. levothyroxine (SYNTHROID) 75 MCG tablet Take 1 tablet (75 mcg total) by mouth daily before breakfast.  . Nutritional Supplements (BOOST HIGH PROTEIN PO) Take 1 Bottle by mouth daily as needed (Dietary supplement).  . simvastatin (ZOCOR) 20 MG tablet Take 1 tablet (20 mg total) by mouth daily.   No current facility-administered medications on file prior to visit.    Review of Systems Per HPI unless specifically indicated in ROS section     Objective:    BP 138/76 mmHg  Pulse 88  Temp(Src) 97.4 F (36.3 C) (Oral)  Wt 143 lb 4 oz (64.978 kg)  Wt Readings from Last 3 Encounters:  11/30/15 143 lb 4 oz (64.978 kg)  11/20/15 140 lb 3.2 oz (63.594 kg)  11/12/15 146 lb (66.225 kg)    Physical Exam  Constitutional: He is oriented to person, place, and time. He appears well-developed and well-nourished. No distress.  Slowed gait. Does not have ambulatory assistive device today. Has walker and cane at home.  HENT:  Head: Normocephalic and atraumatic.  Mouth/Throat: Oropharynx is clear and moist. No oropharyngeal exudate.  Eyes:  Conjunctivae and EOM are normal. Pupils are equal, round, and reactive to light. No scleral icterus.  Neck: Normal range of motion. Neck supple. No thyromegaly present.  Cardiovascular: Normal rate, regular rhythm, normal heart sounds and intact distal pulses.   No murmur heard. Pulmonary/Chest: Effort normal and breath sounds normal. No respiratory distress. He has no wheezes. He has no rales.   Musculoskeletal: He exhibits no edema.  Lymphadenopathy:    He has no cervical adenopathy.  Neurological: He is alert and oriented to person, place, and time.  5/5 strength BUE and BLE Restless, fidgeting  Skin: Skin is warm and dry. No rash noted.  Psychiatric: He has a normal mood and affect.  Nursing note and vitals reviewed.  MR brain no contrast: IMPRESSION: 1. Acute anterior division left MCA infarct. No associated mass effect or hemorrhage. 2. Superimposed small acute lacunar infarct in the posterior MCA territory, near the superior sensory strip. 3. Elsewhere stable MRI appearance of the brain since January. Electronically Signed  By: Odessa Fleming M.D.  On: 11/20/2015 16:22  CT head no contrast: IMPRESSION: Expected evolution of acute nonhemorrhagic moderate size left frontal lobe/ anterior left peri operculum infarct. Electronically Signed  By: Lacy Duverney M.D.  On: 11/23/2015 07:16     Assessment & Plan:   Problem List Items Addressed This Visit      Chronic   Chronic atrial fibrillation (HCC) (Chronic)    Now back on eliquis. F/u scheduled with cardiology later this week. Today rate controlled, only slightly irregular today.        Other   Dyslipidemia, goal LDL below 70 - on statin, followed by primary physician. (Chronic)    Statin atorvastatin changed to simvastatin by neuro - continue this. LDL at goal <70.      Relevant Orders   LDL cholesterol, direct   Essential hypertension (Chronic)    Chronic, actually stable off regimen at this time. Will continue to monitor. ACEI held due to bump in creatinine - recheck today      Hypothyroidism    Update TSH today. Lab Results  Component Value Date   TSH 0.29* 10/06/2015        Relevant Orders   TSH   Urinary urgency    Unclear cause. ?BPHP related. Discussed flomax vs finasteride. Pt has decideded to monitor symptoms for now.       Anemia    Recent level was low - recheck today along with  anemia panel.       Relevant Orders   Vitamin B12   Ferritin   IBC panel   Folate   CBC with Differential/Platelet   Acute embolic stroke within last 8 weeks (HCC) - Primary    L anterior MCA territory likely after due to off anticoagulation. Now on eliquis BID.  Will update labs.  Will refer to home physical therapy - pt states was not contacted by them to establish care yet Frances Furbish)      Relevant Orders   Ambulatory referral to Home Health   Renal function panel       Follow up plan: Return in about 4 weeks (around 12/28/2015), or as needed, for follow up visit.  Eustaquio Boyden, MD

## 2015-12-01 LAB — RENAL FUNCTION PANEL
ALBUMIN: 4.1 g/dL (ref 3.5–5.2)
BUN: 36 mg/dL — ABNORMAL HIGH (ref 6–23)
CHLORIDE: 104 meq/L (ref 96–112)
CO2: 28 mEq/L (ref 19–32)
Calcium: 9.7 mg/dL (ref 8.4–10.5)
Creatinine, Ser: 1.47 mg/dL (ref 0.40–1.50)
GFR: 47.09 mL/min — ABNORMAL LOW (ref >60.00)
Glucose, Bld: 104 mg/dL — ABNORMAL HIGH (ref 70–99)
PHOSPHORUS: 2.9 mg/dL (ref 2.3–4.6)
POTASSIUM: 4.3 meq/L (ref 3.5–5.1)
SODIUM: 139 meq/L (ref 135–145)

## 2015-12-01 LAB — IBC PANEL
IRON: 72 ug/dL (ref 42–165)
Saturation Ratios: 22.3 % (ref 20.0–50.0)
TRANSFERRIN: 231 mg/dL (ref 212.0–360.0)

## 2015-12-01 LAB — LDL CHOLESTEROL, DIRECT: LDL DIRECT: 70 mg/dL

## 2015-12-01 LAB — CBC WITH DIFFERENTIAL/PLATELET
BASOS PCT: 0.4 % (ref 0.0–3.0)
Basophils Absolute: 0 10*3/uL (ref 0.0–0.1)
EOS ABS: 0.2 10*3/uL (ref 0.0–0.7)
Eosinophils Relative: 2.2 % (ref 0.0–5.0)
HCT: 35.3 % — ABNORMAL LOW (ref 39.0–52.0)
Hemoglobin: 11.7 g/dL — ABNORMAL LOW (ref 13.0–17.0)
Lymphocytes Relative: 20.8 % (ref 12.0–46.0)
Lymphs Abs: 1.7 10*3/uL (ref 0.7–4.0)
MCHC: 33 g/dL (ref 30.0–36.0)
MCV: 95.6 fl (ref 78.0–100.0)
MONO ABS: 0.7 10*3/uL (ref 0.1–1.0)
Monocytes Relative: 8.3 % (ref 3.0–12.0)
NEUTROS ABS: 5.6 10*3/uL (ref 1.4–7.7)
Neutrophils Relative %: 68.3 % (ref 43.0–77.0)
PLATELETS: 309 10*3/uL (ref 150.0–400.0)
RBC: 3.7 Mil/uL — ABNORMAL LOW (ref 4.22–5.81)
RDW: 15 % (ref 11.5–15.5)
WBC: 8.3 10*3/uL (ref 4.0–10.5)

## 2015-12-01 LAB — TSH: TSH: 6 u[IU]/mL — AB (ref 0.35–4.50)

## 2015-12-01 LAB — FERRITIN: Ferritin: 137.9 ng/mL (ref 22.0–322.0)

## 2015-12-01 LAB — FOLATE: Folate: 10.9 ng/mL (ref >5.9)

## 2015-12-01 LAB — VITAMIN B12: VITAMIN B 12: 418 pg/mL (ref 211–911)

## 2015-12-01 NOTE — Assessment & Plan Note (Addendum)
L anterior MCA territory likely after due to off anticoagulation. Now on eliquis BID.  Will update labs.  Will refer to home physical therapy - pt states was not contacted by them to establish care yet Frances Furbish([facility])

## 2015-12-01 NOTE — Assessment & Plan Note (Signed)
Update TSH today. Lab Results  Component Value Date   TSH 0.29* 10/06/2015

## 2015-12-01 NOTE — Assessment & Plan Note (Signed)
Recent level was low - recheck today along with anemia panel.

## 2015-12-01 NOTE — Assessment & Plan Note (Signed)
Unclear cause. ?BPHP related. Discussed flomax vs finasteride. Pt has decideded to monitor symptoms for now.

## 2015-12-01 NOTE — Telephone Encounter (Signed)
Pt declines home health PT. Noted.

## 2015-12-01 NOTE — Assessment & Plan Note (Signed)
Chronic, actually stable off regimen at this time. Will continue to monitor. ACEI held due to bump in creatinine - recheck today

## 2015-12-02 ENCOUNTER — Ambulatory Visit (INDEPENDENT_AMBULATORY_CARE_PROVIDER_SITE_OTHER): Payer: PPO | Admitting: Physician Assistant

## 2015-12-02 ENCOUNTER — Encounter: Payer: Self-pay | Admitting: Physician Assistant

## 2015-12-02 VITALS — BP 124/78 | HR 84 | Ht 67.0 in | Wt 141.0 lb

## 2015-12-02 DIAGNOSIS — E785 Hyperlipidemia, unspecified: Secondary | ICD-10-CM

## 2015-12-02 DIAGNOSIS — I482 Chronic atrial fibrillation, unspecified: Secondary | ICD-10-CM

## 2015-12-02 DIAGNOSIS — Z951 Presence of aortocoronary bypass graft: Secondary | ICD-10-CM | POA: Diagnosis not present

## 2015-12-02 DIAGNOSIS — I2510 Atherosclerotic heart disease of native coronary artery without angina pectoris: Secondary | ICD-10-CM | POA: Diagnosis not present

## 2015-12-02 DIAGNOSIS — I10 Essential (primary) hypertension: Secondary | ICD-10-CM

## 2015-12-02 NOTE — Patient Instructions (Signed)
Medication Instructions:  Your physician recommends that you continue on your current medications as directed. Please refer to the Current Medication list given to you today.   Labwork: None ordered  Testing/Procedures: None ordered  Follow-Up: Your physician recommends that you schedule a follow-up appointment in: 3 months with Dr.Harding   Any Other Special Instructions Will Be Listed Below (If Applicable).     If you need a refill on your cardiac medications before your next appointment, please call your pharmacy.

## 2015-12-02 NOTE — Progress Notes (Signed)
Patient ID: Jacob [name], male   DOB: 07-Dec-1916, 82 y.o.   MRN: [id number]    Date:  12/02/2015   ID:  Jacob [name], DOB June 04, 1917, MRN [id number]  PCP:  Jacob Boyden, MD  Primary Cardiologist:  Jacob [name]  Chief complaint: Post hospital follow-up   History of Present Illness: Jacob [name] is a 82 y.o. male  Jacob [name] was last seen in Nov 2015 -- unfortunately shortly after that visit, he was admitted for a fall when he did damage on his neck -- he had a perivertebral hematoma with a C4-C5 fracture. nd I simply felt that this could be decision left to his primary physician or to neurosurgery. Unfortunately I don't think this was fully understood. He therefore was not restarted back on warfarin at all. He is now presenting after a stroke.  Hospitalizations: Sep 23, 2015 - L Sided weakness - Acute right MCA stroke (HCC) -- had been off Warfarin after a fall in the autum of 2016. (per NSgx recommendation -- as far as I know he was supposed been following up with Jacob [name] shortly after that admission.). MRI of brain was performed which revealed small acute lacunar infarct in the right motor strip posterior right MCA territory, at the area of left upper extremity representation no other acute intracranial abnormality was found. Although small subacute lacunar infarct in the left. Motor area was also noted. Carotid ultrasound revealed mild 1-49% stenosis in proximal right and left internal carotid artery. Dr Jacob [name] discussed case with patient's family extensively and recommended Plavix and different statin, Lipitor at the higher dose, noting that it felt very likely will not completely protect patient from strokes.  Patient was discharged from the hospital again 11/16/2015 after suffering an acute right MCA stroke. Plavix was discontinued and he was restarted on eliquis 2.5 mg twice a day.  During the hospitalization his heart rate dropped into the 40s with pauses. His carvedilol was  discontinued and his ACE inhibitor was discontinued because of elevated creatinine.    Patient presents for posthospital follow-up. His daughter was translating. Patient reports doing well he has no particular complaints.  He's taking all medications as prescribed. His weight is stable.  The patient currently denies nausea, vomiting, fever, chest pain, shortness of breath, orthopnea, dizziness, PND, cough, congestion, abdominal pain, hematochezia, melena, lower extremity edema, claudication.  Wt Readings from Last 3 Encounters:  12/02/15 141 lb (63.957 kg)  11/30/15 143 lb 4 oz (64.978 kg)  11/20/15 140 lb 3.2 oz (63.594 kg)     Past Medical History  Diagnosis Date  . CAD in native artery -->  1994    referred for CABG  . S/P CABG x 4 1994    a) 1994: LIMA-LAD, SVG-RI, SVG-OM, SVG-RCA; b) 09/2006, Persantine Myoview: fixed inferior defect consistent with infarct/scar without peri-infarct ischemia;; 2-D echo: Mild-mod concentric LVH. Normal EF.  Abnormal relaxation, aortic sclerosis with mild-mod AI  . Chronic atrial fibrillation (HCC) 12/15/2012  . Long term (current) use of anticoagulants 12/15/2012    warfarin  . Essential hypertension 05/23/2013  . Dyslipidemia, goal LDL below 70[272.4]      - on statin, followed by primary physician.   . Glaucoma   . History of kidney stones   . H/O seasonal allergies   . Diverticulitis   . C4 cervical fracture (HCC) 2015    with spinal cord contusion - stopped coumadin.  . Acute right MCA stroke (HCC) 09/23/2015  . Acute ischemic left MCA  stroke (HCC) 10/2015    Current Outpatient Prescriptions  Medication Sig Dispense Refill  . apixaban (ELIQUIS) 2.5 MG TABS tablet Take 1 tablet (2.5 mg total) by mouth 2 (two) times daily. 60 tablet 2  . latanoprost (XALATAN) 0.005 % ophthalmic solution Place 1 drop into both eyes at bedtime.    Marland Kitchen. levothyroxine (SYNTHROID) 75 MCG tablet Take 1 tablet (75 mcg total) by mouth daily before breakfast. 30 tablet 6    . Nutritional Supplements (BOOST HIGH PROTEIN PO) Take 1 Bottle by mouth daily as needed (Dietary supplement).    . simvastatin (ZOCOR) 20 MG tablet Take 1 tablet (20 mg total) by mouth daily. 90 tablet 1   No current facility-administered medications for this visit.    Allergies:   No Known Allergies  Social History:  The patient  reports that he has never smoked. He has never used smokeless tobacco. He reports that he does not drink alcohol or use illicit drugs.   Family history:   Family History  Problem Relation Age of Onset  . Liver cancer Mother   . Prostate cancer Father     ROS:  Please see the history of present illness.  All other systems reviewed and negative.   PHYSICAL EXAM: VS:  BP 124/78 mmHg  Pulse 84  Ht 5\' 7"  (1.702 m)  Wt 141 lb (63.957 kg)  BMI 22.08 kg/m2 Well nourished, well developed, in no acute distress HEENT: Pupils are equal round react to light accommodation extraocular movements are intact.  Neck: no JVDNo cervical lymphadenopathy. Cardiac: Irregular rate and rhythm no murmurs  Lungs:  clear to auscultation bilaterally, no wheezing, rhonchi or rales Abd: soft, nontender, positive bowel sounds all quadrants, no hepatosplenomegaly Ext: no lower extremity edema.  2+ radial and dorsalis pedis pulses. Skin: warm and dry Neuro:  Grossly normal    ASSESSMENT AND PLAN:  Problem List Items Addressed This Visit    S/P CABG x 4 (1994) : LIMA-LAD, SVG-RI, SVG-OM, SVG-RCA (Chronic)   Essential hypertension (Chronic)   Dyslipidemia, goal LDL below 70 - on statin, followed by primary physician. (Chronic)   Chronic atrial fibrillation (HCC) - Primary (Chronic)   CAD in native artery -->  CABG x4 in 1994 (Chronic)     Overall Jacob [name] appears to be doing quite well. His blood pressures well-controlled on no medications presently designed for that. He continues on Zocor for his cholesterol. His heart rate is also controlled despite being off of Coreg. He  continues on a liquid 2.5 mg twice daily. I will have him follow-up in 3 months with Dr. Herbie BaltimoreHarding..Marland Kitchen

## 2015-12-04 ENCOUNTER — Other Ambulatory Visit: Payer: Self-pay | Admitting: Family Medicine

## 2015-12-04 MED ORDER — LEVOTHYROXINE SODIUM 88 MCG PO TABS: 88.0000 ug | ORAL_TABLET | Freq: Every day | ORAL | Status: DC

## 2015-12-08 ENCOUNTER — Telehealth: Payer: Self-pay

## 2015-12-08 DIAGNOSIS — E785 Hyperlipidemia, unspecified: Secondary | ICD-10-CM

## 2015-12-08 NOTE — Telephone Encounter (Signed)
No, we don't need to repeat it.

## 2015-12-08 NOTE — Telephone Encounter (Signed)
-----   Message from Richarda OverlieJada A Levette Paulick, [place] sent at 11/12/2015 10:49 AM EDT ----- Needs Fasting Lipid. Would like done at PCP. Call Kathie RhodesBetty (Family Member) @ [phone]

## 2015-12-08 NOTE — Telephone Encounter (Signed)
Was about to call and [facility] [phone]) for draw when I noticed pt had LDL only done 11/30/15. Do you still need the whole lipid panel drawn? Please advise.

## 2015-12-15 ENCOUNTER — Inpatient Hospital Stay
Admission: EM | Admit: 2015-12-15 | Discharge: 2015-12-18 | DRG: 481 | Disposition: A | Discharge status: Skilled Nursing Facility | Payer: PPO | Attending: Internal Medicine | Admitting: Internal Medicine

## 2015-12-15 ENCOUNTER — Emergency Department: Payer: PPO

## 2015-12-15 ENCOUNTER — Encounter: Payer: Self-pay | Admitting: Emergency Medicine

## 2015-12-15 DIAGNOSIS — I739 Peripheral vascular disease, unspecified: Secondary | ICD-10-CM | POA: Diagnosis present

## 2015-12-15 DIAGNOSIS — Z87442 Personal history of urinary calculi: Secondary | ICD-10-CM

## 2015-12-15 DIAGNOSIS — I129 Hypertensive chronic kidney disease with stage 1 through stage 4 chronic kidney disease, or unspecified chronic kidney disease: Secondary | ICD-10-CM | POA: Diagnosis not present

## 2015-12-15 DIAGNOSIS — I248 Other forms of acute ischemic heart disease: Secondary | ICD-10-CM | POA: Diagnosis not present

## 2015-12-15 DIAGNOSIS — I2510 Atherosclerotic heart disease of native coronary artery without angina pectoris: Secondary | ICD-10-CM | POA: Diagnosis not present

## 2015-12-15 DIAGNOSIS — I252 Old myocardial infarction: Secondary | ICD-10-CM

## 2015-12-15 DIAGNOSIS — Z79899 Other long term (current) drug therapy: Secondary | ICD-10-CM | POA: Diagnosis not present

## 2015-12-15 DIAGNOSIS — Z951 Presence of aortocoronary bypass graft: Secondary | ICD-10-CM

## 2015-12-15 DIAGNOSIS — H409 Unspecified glaucoma: Secondary | ICD-10-CM | POA: Diagnosis present

## 2015-12-15 DIAGNOSIS — Z800 Family history of malignant neoplasm of digestive organs: Secondary | ICD-10-CM

## 2015-12-15 DIAGNOSIS — Z79811 Long term (current) use of aromatase inhibitors: Secondary | ICD-10-CM | POA: Diagnosis not present

## 2015-12-15 DIAGNOSIS — Z8673 Personal history of transient ischemic attack (TIA), and cerebral infarction without residual deficits: Secondary | ICD-10-CM

## 2015-12-15 DIAGNOSIS — S72009A Fracture of unspecified part of neck of unspecified femur, initial encounter for closed fracture: Secondary | ICD-10-CM | POA: Diagnosis present

## 2015-12-15 DIAGNOSIS — Y92009 Unspecified place in unspecified non-institutional (private) residence as the place of occurrence of the external cause: Secondary | ICD-10-CM

## 2015-12-15 DIAGNOSIS — E785 Hyperlipidemia, unspecified: Secondary | ICD-10-CM | POA: Diagnosis not present

## 2015-12-15 DIAGNOSIS — F0390 Unspecified dementia without behavioral disturbance: Secondary | ICD-10-CM | POA: Diagnosis not present

## 2015-12-15 DIAGNOSIS — D500 Iron deficiency anemia secondary to blood loss (chronic): Secondary | ICD-10-CM | POA: Diagnosis not present

## 2015-12-15 DIAGNOSIS — R52 Pain, unspecified: Secondary | ICD-10-CM

## 2015-12-15 DIAGNOSIS — N183 Chronic kidney disease, stage 3 (moderate): Secondary | ICD-10-CM | POA: Diagnosis not present

## 2015-12-15 DIAGNOSIS — W19XXXA Unspecified fall, initial encounter: Secondary | ICD-10-CM | POA: Diagnosis present

## 2015-12-15 DIAGNOSIS — N390 Urinary tract infection, site not specified: Secondary | ICD-10-CM

## 2015-12-15 DIAGNOSIS — S72011A Unspecified intracapsular fracture of right femur, initial encounter for closed fracture: Principal | ICD-10-CM

## 2015-12-15 DIAGNOSIS — Z01810 Encounter for preprocedural cardiovascular examination: Secondary | ICD-10-CM | POA: Diagnosis not present

## 2015-12-15 DIAGNOSIS — B9689 Other specified bacterial agents as the cause of diseases classified elsewhere: Secondary | ICD-10-CM | POA: Diagnosis not present

## 2015-12-15 DIAGNOSIS — Z7901 Long term (current) use of anticoagulants: Secondary | ICD-10-CM

## 2015-12-15 DIAGNOSIS — M810 Age-related osteoporosis without current pathological fracture: Secondary | ICD-10-CM | POA: Diagnosis not present

## 2015-12-15 DIAGNOSIS — B9620 Unspecified Escherichia coli [E. coli] as the cause of diseases classified elsewhere: Secondary | ICD-10-CM | POA: Diagnosis present

## 2015-12-15 DIAGNOSIS — E039 Hypothyroidism, unspecified: Secondary | ICD-10-CM | POA: Diagnosis present

## 2015-12-15 DIAGNOSIS — S72001A Fracture of unspecified part of neck of right femur, initial encounter for closed fracture: Secondary | ICD-10-CM | POA: Diagnosis not present

## 2015-12-15 DIAGNOSIS — S72034A Nondisplaced midcervical fracture of right femur, initial encounter for closed fracture: Secondary | ICD-10-CM | POA: Diagnosis not present

## 2015-12-15 DIAGNOSIS — I10 Essential (primary) hypertension: Secondary | ICD-10-CM | POA: Diagnosis present

## 2015-12-15 DIAGNOSIS — Z8042 Family history of malignant neoplasm of prostate: Secondary | ICD-10-CM

## 2015-12-15 DIAGNOSIS — I4891 Unspecified atrial fibrillation: Secondary | ICD-10-CM | POA: Diagnosis not present

## 2015-12-15 DIAGNOSIS — I482 Chronic atrial fibrillation: Secondary | ICD-10-CM | POA: Diagnosis present

## 2015-12-15 DIAGNOSIS — S0990XA Unspecified injury of head, initial encounter: Secondary | ICD-10-CM | POA: Diagnosis not present

## 2015-12-15 DIAGNOSIS — M25551 Pain in right hip: Secondary | ICD-10-CM | POA: Diagnosis not present

## 2015-12-15 DIAGNOSIS — Z419 Encounter for procedure for purposes other than remedying health state, unspecified: Secondary | ICD-10-CM

## 2015-12-15 HISTORY — DX: Fracture of unspecified part of neck of unspecified femur, initial encounter for closed fracture: S72.009A

## 2015-12-15 LAB — BASIC METABOLIC PANEL
Anion gap: 10 (ref 5–15)
BUN: 37 mg/dL — ABNORMAL HIGH (ref 6–20)
CHLORIDE: 108 mmol/L (ref 101–111)
CO2: 23 mmol/L (ref 22–32)
Calcium: 9.8 mg/dL (ref 8.9–10.3)
Creatinine, Ser: 1.63 mg/dL — ABNORMAL HIGH (ref 0.61–1.24)
GFR calc non Af Amer: 34 mL/min — ABNORMAL LOW (ref >60)
GFR, EST AFRICAN AMERICAN: 39 mL/min — AB (ref >60)
Glucose, Bld: 165 mg/dL — ABNORMAL HIGH (ref 65–99)
POTASSIUM: 4 mmol/L (ref 3.5–5.1)
SODIUM: 141 mmol/L (ref 135–145)

## 2015-12-15 LAB — URINALYSIS COMPLETE WITH MICROSCOPIC (ARMC ONLY)
Bilirubin Urine: NEGATIVE
Glucose, UA: NEGATIVE mg/dL
KETONES UR: NEGATIVE mg/dL
NITRITE: NEGATIVE
PH: 5 (ref 5.0–8.0)
PROTEIN: 100 mg/dL — AB
SPECIFIC GRAVITY, URINE: 1.015 (ref 1.005–1.030)
Squamous Epithelial / LPF: NONE SEEN

## 2015-12-15 LAB — CBC WITH DIFFERENTIAL/PLATELET
BASOS ABS: 0 10*3/uL (ref 0–0.1)
BASOS PCT: 0 %
EOS PCT: 0 %
Eosinophils Absolute: 0 10*3/uL (ref 0–0.7)
HCT: 32.7 % — ABNORMAL LOW (ref 40.0–52.0)
Hemoglobin: 11.2 g/dL — ABNORMAL LOW (ref 13.0–18.0)
LYMPHS PCT: 2 %
Lymphs Abs: 0.4 10*3/uL — ABNORMAL LOW (ref 1.0–3.6)
MCH: 32.4 pg (ref 26.0–34.0)
MCHC: 34.2 g/dL (ref 32.0–36.0)
MCV: 94.6 fL (ref 80.0–100.0)
MONO ABS: 0.4 10*3/uL (ref 0.2–1.0)
Monocytes Relative: 2 %
Neutro Abs: 17.7 10*3/uL — ABNORMAL HIGH (ref 1.4–6.5)
Neutrophils Relative %: 96 %
PLATELETS: 210 10*3/uL (ref 150–440)
RBC: 3.46 MIL/uL — ABNORMAL LOW (ref 4.40–5.90)
RDW: 14.2 % (ref 11.5–14.5)
WBC: 18.5 10*3/uL — ABNORMAL HIGH (ref 3.8–10.6)

## 2015-12-15 LAB — GLUCOSE, CAPILLARY: GLUCOSE-CAPILLARY: 132 mg/dL — AB (ref 65–99)

## 2015-12-15 LAB — TYPE AND SCREEN
ABO/RH(D): O POS
ANTIBODY SCREEN: NEGATIVE

## 2015-12-15 LAB — TROPONIN I: Troponin I: 0.07 ng/mL — ABNORMAL HIGH (ref <0.031)

## 2015-12-15 LAB — PROTIME-INR
INR: 1.47
PROTHROMBIN TIME: 17.9 s — AB (ref 11.4–15.0)

## 2015-12-15 MED ORDER — ONDANSETRON HCL 4 MG PO TABS: 4.0000 mg | ORAL_TABLET | Freq: Four times a day (QID) | ORAL | Status: DC | PRN

## 2015-12-15 MED ORDER — CLINDAMYCIN PHOSPHATE 600 MG/50ML IV SOLN
600.0000 mg | Freq: Once | INTRAVENOUS | Status: AC
  Administered 2015-12-15: 600 mg via INTRAVENOUS
  Filled 2015-12-15: qty 50

## 2015-12-15 MED ORDER — ACETAMINOPHEN 325 MG PO TABS: 650.0000 mg | ORAL_TABLET | Freq: Four times a day (QID) | ORAL | Status: DC | PRN

## 2015-12-15 MED ORDER — FENTANYL CITRATE (PF) 100 MCG/2ML IJ SOLN
12.5000 ug | Freq: Once | INTRAMUSCULAR | Status: AC
  Administered 2015-12-15: 12.5 ug via INTRAVENOUS
  Filled 2015-12-15: qty 2

## 2015-12-15 MED ORDER — SIMVASTATIN 20 MG PO TABS
20.0000 mg | ORAL_TABLET | Freq: Every day | ORAL | Status: DC
  Administered 2015-12-15: 20 mg via ORAL
  Filled 2015-12-15: qty 1

## 2015-12-15 MED ORDER — DEXTROSE 5 % IV SOLN
1.0000 g | Freq: Once | INTRAVENOUS | Status: AC
  Administered 2015-12-15: 1 g via INTRAVENOUS
  Filled 2015-12-15: qty 10

## 2015-12-15 MED ORDER — DEXTROSE 5 % IV SOLN
1.0000 g | INTRAVENOUS | Status: DC
  Administered 2015-12-15: 1 g via INTRAVENOUS
  Filled 2015-12-15 (×2): qty 10

## 2015-12-15 MED ORDER — MORPHINE SULFATE (PF) 2 MG/ML IV SOLN
1.0000 mg | INTRAVENOUS | Status: DC | PRN
  Administered 2015-12-15: 1 mg via INTRAVENOUS
  Filled 2015-12-15: qty 1

## 2015-12-15 MED ORDER — DEXTROSE 5 % IV SOLN
INTRAVENOUS | Status: AC
  Filled 2015-12-15: qty 10

## 2015-12-15 MED ORDER — MORPHINE SULFATE (PF) 2 MG/ML IV SOLN: 1.0000 mg | INTRAVENOUS | Status: DC | PRN

## 2015-12-15 MED ORDER — LATANOPROST 0.005 % OP SOLN
1.0000 [drp] | Freq: Every day | OPHTHALMIC | Status: DC
Start: 2015-12-15 — End: 2015-12-16
  Administered 2015-12-15: 1 [drp] via OPHTHALMIC
  Filled 2015-12-15: qty 2.5

## 2015-12-15 MED ORDER — SODIUM CHLORIDE 0.45 % IV SOLN
INTRAVENOUS | Status: DC
  Administered 2015-12-15 – 2015-12-16 (×2): via INTRAVENOUS

## 2015-12-15 MED ORDER — ACETAMINOPHEN 650 MG RE SUPP: 650.0000 mg | Freq: Four times a day (QID) | RECTAL | Status: DC | PRN

## 2015-12-15 MED ORDER — SODIUM CHLORIDE 0.9 % IV SOLN
INTRAVENOUS | Status: DC
  Administered 2015-12-16: 12:00:00 via INTRAVENOUS

## 2015-12-15 MED ORDER — LEVOTHYROXINE SODIUM 88 MCG PO TABS
88.0000 ug | ORAL_TABLET | Freq: Every day | ORAL | Status: DC
  Administered 2015-12-16: 88 ug via ORAL
  Filled 2015-12-15: qty 1

## 2015-12-15 MED ORDER — CEFAZOLIN SODIUM-DEXTROSE 2-4 GM/100ML-% IV SOLN
2.0000 g | INTRAVENOUS | Status: DC
  Filled 2015-12-15: qty 100

## 2015-12-15 MED ORDER — ONDANSETRON HCL 4 MG/2ML IJ SOLN: 4.0000 mg | Freq: Four times a day (QID) | INTRAMUSCULAR | Status: DC | PRN

## 2015-12-15 MED ORDER — CARVEDILOL 3.125 MG PO TABS
3.1250 mg | ORAL_TABLET | Freq: Two times a day (BID) | ORAL | Status: DC
  Administered 2015-12-16: 3.125 mg via ORAL
  Filled 2015-12-15: qty 1

## 2015-12-15 MED ORDER — OXYCODONE HCL 5 MG PO TABS
5.0000 mg | ORAL_TABLET | ORAL | Status: DC | PRN
  Filled 2015-12-15: qty 1

## 2015-12-15 NOTE — Progress Notes (Signed)
PT Cancellation Note  Patient Details Name: Jacob SloopRamon F [name] MRN: [id number] DOB: 1917-04-19   Cancelled Treatment:    Reason Eval/Treat Not Completed: Patient not medically ready.  Per notes pt s/p fall with R hip fracture and pending ortho consult for options (surgery vs conservative management).  Will hold PT eval at this time until orthopedics completes consult and POC is determined.   Hendricks Limesmily Marieclaire Bettenhausen 12/15/2015, 4:25 PM Hendricks LimesEmily Rachal Dvorsky, PT [phone]

## 2015-12-15 NOTE — ED Notes (Signed)
Per family, pt had unwitnessed fall at home. Family reports they believe he became disoriented and fell. Reports after his last stroke that patient has been exhibiting confusion with following simple commands. States pt has been wandering as well. Family requesting social work consult for placement in facility

## 2015-12-15 NOTE — ED Provider Notes (Signed)
[facility] Emergency Department Provider Note   ____________________________________________  Time seen: Approximately 11:30 AM I have reviewed the triage vital signs and the triage nursing note.  HISTORY  Chief Complaint Fall   Historian Patient's daughter, the patient lives with his daughter. Patient himself has some confusion at baseline, and speaks [place]. His wife also provided some history this patient is through her daughter.  HPI Jacob [name] is a 82 y.o. male is here from home for evaluation after unwitnessed fall off the bed yesterday and still complaining of right hip pain, especially with movement. Wife had reported that she heard a thump and went in and found him on the floor. The wife and her husband picked her father up the patient and put them back in bed, and this morning is still complaining of right hip pain. Daughter states that he has been somewhat increasingly confused since the diagnosis of a second stroke 2 weeks ago. Yesterday he was not following the rules the house, including for him not to walk outside alone, or walk on the grass. He seemed to be confused.  No fever, cough, vomiting or diarrhea. Family feels like they're unable to care for him at home now with his mental status/dementia.      Past Medical History  Diagnosis Date  . CAD in native artery -->  1994    referred for CABG  . S/P CABG x 4 1994    a) 1994: LIMA-LAD, SVG-RI, SVG-OM, SVG-RCA; b) 09/2006, Persantine Myoview: fixed inferior defect consistent with infarct/scar without peri-infarct ischemia;; 2-D echo: Mild-mod concentric LVH. Normal EF.  Abnormal relaxation, aortic sclerosis with mild-mod AI  . Chronic atrial fibrillation (HCC) 12/15/2012  . Long term (current) use of anticoagulants 12/15/2012    warfarin  . Essential hypertension 05/23/2013  . Dyslipidemia, goal LDL below 70[272.4]      - on statin, followed by primary physician.   . Glaucoma   . History  of kidney stones   . H/O seasonal allergies   . Diverticulitis   . C4 cervical fracture (HCC) 2015    with spinal cord contusion - stopped coumadin.  . Acute right MCA stroke (HCC) 09/23/2015  . Acute ischemic left MCA stroke (HCC) 10/2015    Patient Active Problem List   Diagnosis Date Noted  . Acute embolic stroke within last 8 weeks (HCC) 10/28/2015  . Anemia 09/24/2015  . Urinary urgency 02/24/2015  . Hypothyroidism 08/02/2014  . Dysphagia 08/02/2014  . Essential hypertension 05/23/2013  . CAD in native artery -->  CABG x4 in 1994   . 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 Dyslipidemia, goal LDL below 70 - on statin, followed by primary physician.   . Chronic atrial fibrillation (HCC) 12/15/2012    Class: Chronic    Past Surgical History  Procedure Laterality Date  . Coronary artery bypass graft  1994    LIMA-LAD, SVG-RI, SVG-OM, SVG-RCA   . Persantine myoview  February 2008    Fixed inferior wall defect-consistent with infarct/scar without peri-infarct ischemia  . Transthoracic echocardiogram  February 2008    Mild/mod conc LVH, Nl Size & Fxn, Gr 1 DD(abnormal relaxation), mild to moderate aortic regurgitation and mildly sclerotic aortic valve.  Marland Kitchen Appendectomy    . Hand surgery Left 1985  . Femur surgery Left 2000    replacement- due to fall    Current Outpatient Rx  Name  Route  Sig  Dispense  Refill  . apixaban (ELIQUIS) 2.5 MG TABS tablet   Oral   Take 1 tablet (2.5 mg total) by mouth 2 (two) times daily.   60 tablet   2   . latanoprost (XALATAN) 0.005 % ophthalmic solution   Both Eyes   Place 1 drop into both eyes at bedtime.         Marland Kitchen. levothyroxine (SYNTHROID, LEVOTHROID) 88 MCG tablet   Oral   Take 1 tablet (88 mcg total) by mouth daily before breakfast.   30 tablet   6   . Nutritional Supplements (BOOST HIGH PROTEIN PO)   Oral   Take 1 Bottle by mouth daily as needed (Dietary supplement).         .  simvastatin (ZOCOR) 20 MG tablet   Oral   Take 1 tablet (20 mg total) by mouth daily.   90 tablet   1     Allergies Review of patient's allergies indicates no known allergies.  Family History  Problem Relation Age of Onset  . Liver cancer Mother   . Prostate cancer Father     Social History Social History  Substance Use Topics  . Smoking status: Never Smoker   . Smokeless tobacco: Never Used  . Alcohol Use: No    Review of Systems  Constitutional: Negative for fever. Eyes: Has chronic advanced glaucoma, poor vision at baseline. ENT: Negative for sore throat. Cardiovascular: Negative for chest pain. Respiratory: Negative for shortness of breath. Gastrointestinal: Negative for abdominal pain, vomiting and diarrhea. Genitourinary: Negative for dysuria. Musculoskeletal: Negative for back pain. Skin: Negative for rash. Neurological: Negative for headache. 10 point Review of Systems otherwise negative ____________________________________________   PHYSICAL EXAM:  VITAL SIGNS: ED Triage Vitals  Enc Vitals Group     BP 12/15/15 1005 152/69 mmHg     Pulse Rate 12/15/15 1005 96     Resp 12/15/15 1005 20     Temp 12/15/15 1005 98.2 F (36.8 C)     Temp Source 12/15/15 1005 Oral     SpO2 12/15/15 1005 95 %     Weight 12/15/15 1005 146 lb (66.225 kg)     Height 12/15/15 1005 5\' 7"  (1.702 m)     Head Cir --      Peak Flow --      Pain Score 12/15/15 1005 0     Pain Loc --      Pain Edu? --      Excl. in GC? --      Constitutional: Alert and Cooperative. Well appearing and in no distress. HEENT   Head: Normocephalic and atraumatic.      Eyes: Conjunctivae are normal. Cloudy cornea. Normal extraocular movements.      Ears:         Nose: No congestion/rhinnorhea.   Mouth/Throat: Mucous membranes are moist.   Neck: No stridor. No posterior midline neck tenderness palpation. Cardiovascular/Chest: Normal rate, regular rhythm.  No murmurs, rubs, or  gallops. Respiratory: Normal respiratory effort without tachypnea nor retractions. Breath sounds are clear and equal bilaterally. No wheezes/rales/rhonchi. Gastrointestinal: Soft. No distention, no guarding, no rebound. Nontender.    Genitourinary/rectal:Deferred Musculoskeletal: Pelvis stable. Right hip pain with range of motion. Neurologic:  No facial droop. No gross or focal neurologic deficits are appreciated. Skin:  Skin is warm, dry and intact. No rash noted.   ____________________________________________   EKG I, Governor Rooksebecca Ngai Parcell, MD, the attending physician have personally viewed and interpreted all ECGs.  94 bpm.  Undetermined rhythm, slightly irregular, unclear if this is A. fib versus normal sinus with PACs. Nonspecific intraventricular conduction delay. Normal axis. Nonspecific T-wave ____________________________________________  LABS (pertinent positives/negatives)  Urinalysis positive for trace leukocytes, 6-30 white blood cells and rare bacteria with no squamous epithelial cells White blood cell 8.5, hemoglobin 10 point count 210 Troponin 0.07 Basic metabolic panel significant for BUN 37 cranny 1.63 otherwise without significant abnormalities  ____________________________________________  RADIOLOGY All Xrays were viewed by me. Imaging interpreted by Radiologist.  CT head without contrast:  IMPRESSION: Atrophy with small vessel disease. Evolution of recent right frontal lobe infarct. No new new infarct. No hemorrhage or extra-axial fluid collection. No midline shift or mass effect.  Right hip with pelvis: IMPRESSION: 1. Concern of nondisplaced right femoral neck fracture. This could be confirmed with CT if clinically doubted. 2. Previous proximal left femoral ORIF.  CT right hip: IMPRESSION: Impacted and rotated high femoral neck/subcapital fracture of the right hip.  Moderate hip joint degenerative changes and chondrocalcinosis.  The visualized bony pelvis  is intact. __________________________________________  PROCEDURES  Procedure(s) performed: None  Critical Care performed: None  ____________________________________________   ED COURSE / ASSESSMENT AND PLAN  Pertinent labs & imaging results that were available during my care of the patient were reviewed by me and considered in my medical decision making (see chart for details).   In terms of traumatic evaluation, head CT is negative for acute traumatic injury or otherwise intracranial emergency. No evidence of neck, back, chest or pelvis injury. He is complaining of right hip pain with range of motion. X-rays indeterminate, so CT will be obtained.  In terms of medical reason for why he's been a little more confused, it may just be related to dementia and not returned to baseline after second stroke diagnosis weeks ago, however we'll check basic laboratory evaluation and urinalysis.  Patient's CT did show right hip fracture. I discussed this with on-call orthopedist, Dr. Deeann Saint, who stated that the patient's age and family preference of nonoperative management, nonweightbearing to the right lower extremity for 6 weeks, may be moved bed to chair.  Initially we are discussing going directly to a nursing home from the ED, however patient's laboratory analysis was positive for evidence of urinary tract infection, and white blood cell count elevated 18,000. On the one hand I am suspicious that the high white blood cell count may be partially related to stress reaction after hip fracture, however he seems to have had worsening confusion for 2 days, and so perhaps a urinary tract infection is causing some altered mental status. In order to a UTI and observe for stability, will treat and observe in the hospital overnight. Patient was given small dose of IV fentanyl for hip pain.   Metabolic panel results with creatinine near prior baseline. His troponin was minimally elevated at 0.07. He is  not having chest pain. Looks like he's had a minimal elevation of 0.57 past. This will be monitored in the hospital.    CONSULTATIONS: Phone consultation with Dr. Deeann Saint, orthopedics who did review the x-rays and the case with me. Phone consultation with social worker, will be and a resident patient. Discussed with hospitalist, Dr. Sherryll Burger, for admission.   Patient / Family / Caregiver informed of clinical course, medical decision-making process, and agree with plan.     ___________________________________________   FINAL CLINICAL IMPRESSION(S) / ED DIAGNOSES   Final diagnoses:  Femoral neck fracture, right, closed, initial encounter  Subcapital fracture of right femur (  HCC)  Urinary tract infection without hematuria, site unspecified              Note: This dictation was prepared with Dragon dictation. Any transcriptional errors that result from this process are unintentional   Governor Rooks, MD 12/15/15 1316

## 2015-12-15 NOTE — ED Notes (Signed)
Called to give report to floor, name and ascom left for nurse to call back

## 2015-12-15 NOTE — ED Notes (Signed)
Pt to ed with family who reports pt fell last night during the night,  Pt with right sided hip pain today.  Denies hitting head denies loss of consciousness during fall.

## 2015-12-15 NOTE — H&P (Addendum)
[facility] [place] [facility]   PATIENT NAME: Jacob [name]    MR#:  [id number]  DATE OF BIRTH:  15-Jul-1917  DATE OF ADMISSION:  12/15/2015  PRIMARY CARE PHYSICIAN: Eustaquio Boyden, MD   REQUESTING/REFERRING PHYSICIAN: Dr. Governor Rooks  CHIEF COMPLAINT:   Chief Complaint  Patient presents with  . Fall    HISTORY OF PRESENT ILLNESS:  Jacob [name]  is a 82 y.o. male with a known history of Coronary artery disease status post bypass, chronic atrial fibrillation, recent history of acute stroke, glaucoma, essential hypertension, who presents to the hospital after a mechanical fall last night and noted to have a right hip fracture. Patient says that he tripped on something on his floor in the middle night. He denies any prodromal symptoms of chest pain, shortness of breath, dizziness, palpitations, syncope or any seizure type activity. Patient's son-in-law put him in the bed after he fell last night and this morning he was unable to bear any weight on his right leg and therefore came to the ER for further evaluation. In the emergency room patient underwent a CT scan of his right hip which showed a right femoral neck/subcapital fracture. Hospitalist services were contacted further treatment and evaluation.  PAST MEDICAL HISTORY:   Past Medical History  Diagnosis Date  . CAD in native artery -->  1994    referred for CABG  . S/P CABG x 4 1994    a) 1994: LIMA-LAD, SVG-RI, SVG-OM, SVG-RCA; b) 09/2006, Persantine Myoview: fixed inferior defect consistent with infarct/scar without peri-infarct ischemia;; 2-D echo: Mild-mod concentric LVH. Normal EF.  Abnormal relaxation, aortic sclerosis with mild-mod AI  . Chronic atrial fibrillation (HCC) 12/15/2012  . Long term (current) use of anticoagulants 12/15/2012    warfarin  . Essential hypertension 05/23/2013  . Dyslipidemia, goal LDL below 70[272.4]      - on statin, followed by primary physician.   . Glaucoma   . History of  kidney stones   . H/O seasonal allergies   . Diverticulitis   . C4 cervical fracture (HCC) 2015    with spinal cord contusion - stopped coumadin.  . Acute right MCA stroke (HCC) 09/23/2015  . Acute ischemic left MCA stroke (HCC) 10/2015    PAST SURGICAL HISTORY:   Past Surgical History  Procedure Laterality Date  . Coronary artery bypass graft  1994    LIMA-LAD, SVG-RI, SVG-OM, SVG-RCA   . Persantine myoview  February 2008    Fixed inferior wall defect-consistent with infarct/scar without peri-infarct ischemia  . Transthoracic echocardiogram  February 2008    Mild/mod conc LVH, Nl Size & Fxn, Gr 1 DD(abnormal relaxation), mild to moderate aortic regurgitation and mildly sclerotic aortic valve.  Marland Kitchen Appendectomy    . Hand surgery Left 1985  . Femur surgery Left 2000    replacement- due to fall    SOCIAL HISTORY:   Social History  Substance Use Topics  . Smoking status: Never Smoker   . Smokeless tobacco: Never Used  . Alcohol Use: No    FAMILY HISTORY:   Family History  Problem Relation Age of Onset  . Liver cancer Mother   . Prostate cancer Father     DRUG ALLERGIES:  No Known Allergies  REVIEW OF SYSTEMS:   Review of Systems  Constitutional: Negative for fever and weight loss.  HENT: Negative for congestion, nosebleeds and tinnitus.   Eyes: Negative for blurred vision, double vision and redness.  Respiratory: Negative for cough, hemoptysis and  shortness of breath.   Cardiovascular: Negative for chest pain, orthopnea, leg swelling and PND.  Gastrointestinal: Negative for nausea, vomiting, abdominal pain, diarrhea and melena.  Genitourinary: Negative for dysuria, urgency and hematuria.  Musculoskeletal: Positive for joint pain (right hip) and falls.  Neurological: Negative for dizziness, tingling, sensory change, focal weakness, seizures, weakness and headaches.  Endo/Heme/Allergies: Negative for polydipsia. Does not bruise/bleed easily.  Psychiatric/Behavioral:  Negative for depression and memory loss. The patient is not nervous/anxious.     MEDICATIONS AT HOME:   Prior to Admission medications   Medication Sig Start Date End Date Taking? Authorizing Provider  apixaban (ELIQUIS) 2.5 MG TABS tablet Take 1 tablet (2.5 mg total) by mouth 2 (two) times daily. 11/24/15  Yes Osvaldo ShipperGokul Krishnan, MD  carvedilol (COREG) 3.125 MG tablet Take 3.125 mg by mouth 2 (two) times daily with a meal.   Yes Historical Provider, MD  latanoprost (XALATAN) 0.005 % ophthalmic solution Place 1 drop into both eyes at bedtime.   Yes Historical Provider, MD  levothyroxine (SYNTHROID, LEVOTHROID) 88 MCG tablet Take 1 tablet (88 mcg total) by mouth daily before breakfast. 12/04/15  Yes Eustaquio BoydenJavier Gutierrez, MD  simvastatin (ZOCOR) 20 MG tablet Take 20 mg by mouth at bedtime.   Yes Historical Provider, MD      VITAL SIGNS:  Blood pressure 161/80, pulse 91, temperature 98.2 F (36.8 C), temperature source Oral, resp. rate 23, height 5\' 7"  (1.702 m), weight 66.225 kg (146 lb), SpO2 97 %.  PHYSICAL EXAMINATION:  Physical Exam  GENERAL:  82 y.o.-year-old patient lying in the bed in no acute distress.  EYES: Pupils equal, round, reactive to light and accommodation. No scleral icterus. Extraocular muscles intact.  HEENT: Head atraumatic, normocephalic. Oropharynx and nasopharynx clear. No oropharyngeal erythema, moist oral mucosa  NECK:  Supple, no jugular venous distention. No thyroid enlargement, no tenderness.  LUNGS: Normal breath sounds bilaterally, no wheezing, rales, rhonchi. No use of accessory muscles of respiration.  CARDIOVASCULAR: S1, S2 irregular. No murmurs, rubs, gallops, clicks.  ABDOMEN: Soft, nontender, nondistended. Bowel sounds present. No organomegaly or mass.  EXTREMITIES: No pedal edema, cyanosis, or clubbing. + 2 pedal & radial pulses b/l.  Right lower extremity is externally rotated and shortened due to the hip fracture. NEUROLOGIC: Cranial nerves II through XII are  intact. No focal Motor or sensory deficits appreciated b/l PSYCHIATRIC: The patient is alert and oriented x 3. Good affect.  SKIN: No obvious rash, lesion, or ulcer.   LABORATORY PANEL:   CBC  Recent Labs Lab 12/15/15 1207  WBC 18.5*  HGB 11.2*  HCT 32.7*  PLT 210   ------------------------------------------------------------------------------------------------------------------  Chemistries   Recent Labs Lab 12/15/15 1207  NA 141  K 4.0  CL 108  CO2 23  GLUCOSE 165*  BUN 37*  CREATININE 1.63*  CALCIUM 9.8   ------------------------------------------------------------------------------------------------------------------  Cardiac Enzymes  Recent Labs Lab 12/15/15 1207  TROPONINI 0.07*   ------------------------------------------------------------------------------------------------------------------  RADIOLOGY:  Ct Head Wo Contrast  12/15/2015  CLINICAL DATA:  Pain following fall EXAM: CT HEAD WITHOUT CONTRAST TECHNIQUE: Contiguous axial images were obtained from the base of the skull through the vertex without intravenous contrast. COMPARISON:  November 23, 2015 head CT; brain MRI November 20, 2015 FINDINGS: Mild diffuse atrophy is stable. There has been further evolution of the left frontal infarct noted previously. There is no new infarct apparent. There is small vessel disease in the centra semiovale bilaterally as well as in the posterior limb of the left internal capsule. There  is no mass, hemorrhage, extra-axial fluid collection, or midline shift. The bony calvarium appears intact. The visualized mastoid air cells are clear. Visualized intraorbital regions appear normal bilaterally. IMPRESSION: Atrophy with small vessel disease. Evolution of recent right frontal lobe infarct. No new new infarct. No hemorrhage or extra-axial fluid collection. No midline shift or mass effect. Electronically Signed   By: Bretta Bang III M.D.   On: 12/15/2015 10:47   Ct Hip Right Wo  Contrast  12/15/2015  CLINICAL DATA:  Larey Seat last evening.  Right hip pain. EXAM: CT OF THE RIGHT HIP WITHOUT CONTRAST TECHNIQUE: Multidetector CT imaging of the right hip was performed according to the standard protocol. Multiplanar CT image reconstructions were also generated. COMPARISON:  Radiographs 08/01/2014. FINDINGS: There is an impacted subcapital and high femoral neck fracture with medial and posterior rotation of the femoral head. No acetabular fracture is identified. The visualized aspect of the right sacrum is intact. SI joint degenerative changes are noted. There also moderate hip joint degenerative changes with joint space narrowing, osteophytic spurring, subchondral cystic change and chondrocalcinosis. The pubic symphysis is intact. No pubic rami fractures are identified. No significant intrapelvic abnormalities are identified. A right inguinal hernia is noted containing fat. Moderate atherosclerotic calcifications involving the common femoral artery. IMPRESSION: Impacted and rotated high femoral neck/subcapital fracture of the right hip. Moderate hip joint degenerative changes and chondrocalcinosis. The visualized bony pelvis is intact. Electronically Signed   By: Rudie Meyer M.D.   On: 12/15/2015 12:01   Dg Hip Unilat  With Pelvis 2-3 Views Right  12/15/2015  CLINICAL DATA:  Right hip pain after falling last night. EXAM: DG HIP (WITH OR WITHOUT PELVIS) 2-3V RIGHT COMPARISON:  Radiographs 08/01/2014. FINDINGS: The bones are demineralized. A skin fold overlies the right femoral neck. However, there is concern of a nondisplaced femoral neck fracture. Patient is status post left proximal femoral dynamic screw and intramedullary nail fixation. No evidence of acute pelvic fracture or dislocation. Vascular calcifications are noted. IMPRESSION: 1. Concern of nondisplaced right femoral neck fracture. This could be confirmed with CT if clinically doubted. 2. Previous proximal left femoral ORIF.  Electronically Signed   By: Carey Bullocks M.D.   On: 12/15/2015 11:15     IMPRESSION AND PLAN:   82 year old male with past medical history history of coronary artery disease status post bypass, chronic afibrillation, recent acute CVA, glaucoma, hypothyroidism who presents to the hospital after mechanical fall and noted to have a right hip fracture.  1. Preoperative cardiovascular examination-patient is a moderate to high risk for noncardiac surgery. -There are no absolute contraindications to surgery at this time. Hold Eliquis. -Preoperative EKG has been reviewed and shows no acute ST or T-wave changes.  2. Status post fall and right hip fracture-we'll consult orthopedics. -Patient's family is contemplating whether they want surgery or go with the conservative route and will have orthopedics further discuss options with them.  3. Chronic atrial fibrillation-patient's rates are stable. -Continue carvedilol. Hold Eliquis as patient may need surgery. -We will need to discuss possiblity discontinuing anticoagulation as is a high fall risk.  4. Glaucoma-continue latanoprost eyedrops.  5. Hyperlipidemia-continue Zocor.  6. History of recent CVA-continue statin, hold Eliquis given the patient's hip fracture and possible need for surgery.  7. UTI - will place on IV Ceftriaxone and follow urine cultures.  - afebrile.  Elevated WBC count ?? Due to UTI (vs) stress mediated from fracture.    All the records are reviewed and case discussed with  ED provider. Management plans discussed with the patient, family and they are in agreement.  CODE STATUS: Full  TOTAL TIME TAKING CARE OF THIS PATIENT: 45 minutes.    Houston Siren M.D on 12/15/2015 at 1:51 PM  Between 7am to 6pm - Pager - [phone]  After 6pm go to [email or web address] - password [email or web address] [facility]  [facility]  Office  [phone]  CC: Primary care physician; Eustaquio Boyden, MD

## 2015-12-15 NOTE — Consult Note (Signed)
ORTHOPAEDIC CONSULTATION  REQUESTING PHYSICIAN: Houston SirenVivek J Sainani, MD  Chief Complaint: Right hip pain  HPI: Jacob [name] is a 82 y.o. male who complains of  Right hip pain following a fall at home earlier today.  Brought to ER where exam and xrays show an impacted right femoral neck fracture.  He has been ambulatory at home with some dementia.  Recommend pinning of fx and discussed this at length with wife and daughter.  They agree to pinning of fx tomorrow.  Risks and benefits discussed at length with post op protocol as well.  He will need SNF and long term care probably since wife cannot care for him anymore.    Past Medical History  Diagnosis Date  . CAD in native artery -->  1994    referred for CABG  . S/P CABG x 4 1994    a) 1994: LIMA-LAD, SVG-RI, SVG-OM, SVG-RCA; b) 09/2006, Persantine Myoview: fixed inferior defect consistent with infarct/scar without peri-infarct ischemia;; 2-D echo: Mild-mod concentric LVH. Normal EF.  Abnormal relaxation, aortic sclerosis with mild-mod AI  . Chronic atrial fibrillation (HCC) 12/15/2012  . Long term (current) use of anticoagulants 12/15/2012    warfarin  . Essential hypertension 05/23/2013  . Dyslipidemia, goal LDL below 70[272.4]      - on statin, followed by primary physician.   . Glaucoma   . History of kidney stones   . H/O seasonal allergies   . Diverticulitis   . C4 cervical fracture (HCC) 2015    with spinal cord contusion - stopped coumadin.  . Acute right MCA stroke (HCC) 09/23/2015  . Acute ischemic left MCA stroke (HCC) 10/2015   Past Surgical History  Procedure Laterality Date  . Coronary artery bypass graft  1994    LIMA-LAD, SVG-RI, SVG-OM, SVG-RCA   . Persantine myoview  February 2008    Fixed inferior wall defect-consistent with infarct/scar without peri-infarct ischemia  . Transthoracic echocardiogram  February 2008    Mild/mod conc LVH, Nl Size & Fxn, Gr 1 DD(abnormal relaxation), mild to moderate aortic regurgitation  and mildly sclerotic aortic valve.  Marland Kitchen. Appendectomy    . Hand surgery Left 1985  . Femur surgery Left 2000    replacement- due to fall   Social History   Social History  . Marital Status: Married    Spouse Name: N/A  . Number of Children: N/A  . Years of Education: N/A   Social History Main Topics  . Smoking status: Never Smoker   . Smokeless tobacco: Never Used  . Alcohol Use: No  . Drug Use: No  . Sexual Activity: Not Asked   Other Topics Concern  . None   Social History Narrative   Lives with wife and with daughter and her daughter's family (SIL and grandson).   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 He is a grandchildren and one great grand children at present.   He is extremely active and does routinely walk up to 3 times a day for 10-15 minutes at a time. He really is limited by an unstable balance and gait due to his glaucoma and inability to see well.   Family History  Problem Relation Age of Onset  . Liver cancer Mother   . Prostate cancer Father    No Known Allergies Prior to  Admission medications   Medication Sig Start Date End Date Taking? Authorizing Provider  apixaban (ELIQUIS) 2.5 MG TABS tablet Take 1 tablet (2.5 mg total) by mouth 2 (two) times daily. 11/24/15  Yes Osvaldo Shipper, MD  latanoprost (XALATAN) 0.005 % ophthalmic solution Place 1 drop into both eyes at bedtime.   Yes Historical Provider, MD  levothyroxine (SYNTHROID, LEVOTHROID) 88 MCG tablet Take 1 tablet (88 mcg total) by mouth daily before breakfast. 12/04/15  Yes Eustaquio Boyden, MD  simvastatin (ZOCOR) 20 MG tablet Take 20 mg by mouth at bedtime.   Yes Historical Provider, MD  carvedilol (COREG) 3.125 MG tablet Take 3.125 mg by mouth 2 (two) times daily with a meal. Reported on 12/15/2015    Historical  Provider, MD   Ct Head Wo Contrast  12/15/2015  CLINICAL DATA:  Pain following fall EXAM: CT HEAD WITHOUT CONTRAST TECHNIQUE: Contiguous axial images were obtained from the base of the skull through the vertex without intravenous contrast. COMPARISON:  November 23, 2015 head CT; brain MRI November 20, 2015 FINDINGS: Mild diffuse atrophy is stable. There has been further evolution of the left frontal infarct noted previously. There is no new infarct apparent. There is small vessel disease in the centra semiovale bilaterally as well as in the posterior limb of the left internal capsule. There is no mass, hemorrhage, extra-axial fluid collection, or midline shift. The bony calvarium appears intact. The visualized mastoid air cells are clear. Visualized intraorbital regions appear normal bilaterally. IMPRESSION: Atrophy with small vessel disease. Evolution of recent right frontal lobe infarct. No new new infarct. No hemorrhage or extra-axial fluid collection. No midline shift or mass effect. Electronically Signed   By: Bretta Bang III M.D.   On: 12/15/2015 10:47   Ct Hip Right Wo Contrast  12/15/2015  CLINICAL DATA:  Larey Seat last evening.  Right hip pain. EXAM: CT OF THE RIGHT HIP WITHOUT CONTRAST TECHNIQUE: Multidetector CT imaging of the right hip was performed according to the standard protocol. Multiplanar CT image reconstructions were also generated. COMPARISON:  Radiographs 08/01/2014. FINDINGS: There is an impacted subcapital and high femoral neck fracture with medial and posterior rotation of the femoral head. No acetabular fracture is identified. The visualized aspect of the right sacrum is intact. SI joint degenerative changes are noted. There also moderate hip joint degenerative changes with joint space narrowing, osteophytic spurring, subchondral cystic change and chondrocalcinosis. The pubic symphysis is intact. No pubic rami fractures are identified. No significant intrapelvic abnormalities are  identified. A right inguinal hernia is noted containing fat. Moderate atherosclerotic calcifications involving the common femoral artery. IMPRESSION: Impacted and rotated high femoral neck/subcapital fracture of the right hip. Moderate hip joint degenerative changes and chondrocalcinosis. The visualized bony pelvis is intact. Electronically Signed   By: Rudie Meyer M.D.   On: 12/15/2015 12:01   Dg Hip Unilat  With Pelvis 2-3 Views Right  12/15/2015  CLINICAL DATA:  Right hip pain after falling last night. EXAM: DG HIP (WITH OR WITHOUT PELVIS) 2-3V RIGHT COMPARISON:  Radiographs 08/01/2014. FINDINGS: The bones are demineralized. A skin fold overlies the right femoral neck. However, there is concern of a nondisplaced femoral neck fracture. Patient is status post left proximal femoral dynamic screw and intramedullary nail fixation. No evidence of acute pelvic fracture or dislocation. Vascular calcifications are noted. IMPRESSION: 1. Concern of nondisplaced right femoral neck fracture. This could be confirmed with CT if clinically doubted. 2. Previous proximal left femoral ORIF. Electronically Signed   By: Chrissie Noa  Purcell Mouton M.D.   On: 12/15/2015 11:15    Positive ROS: All other systems have been reviewed and were otherwise negative with the exception of those mentioned in the HPI and as above.  Physical Exam: General: Alert, no acute distress Cardiovascular: No pedal edema Respiratory: No cyanosis, no use of accessory musculature GI: No organomegaly, abdomen is soft and non-tender Skin: No lesions in the area of chief complaint Neurologic: Sensation intact distally Psychiatric: Patient is competent for consent with normal mood and affect Lymphatic: No axillary or cervical lymphadenopathy  MUSCULOSKELETAL: Thin male in no distress.  Right leg flexed and pain with rom.  csm good distally.  Skin intact.   No other orthopaedic injuries noted.  Assessment: Impacted right femoral neck fx  Plan: Right  hip pinning tomorrow    Valinda Hoar, MD [phone]   12/15/2015 6:59 PM

## 2015-12-16 ENCOUNTER — Inpatient Hospital Stay: Payer: PPO | Admitting: Anesthesiology

## 2015-12-16 ENCOUNTER — Encounter: Payer: Self-pay | Admitting: Anesthesiology

## 2015-12-16 ENCOUNTER — Inpatient Hospital Stay: Payer: PPO

## 2015-12-16 ENCOUNTER — Encounter
Admission: EM | Disposition: A | Discharge status: Skilled Nursing Facility | Payer: Self-pay | Source: Home / Self Care | Attending: Internal Medicine

## 2015-12-16 DIAGNOSIS — M25551 Pain in right hip: Secondary | ICD-10-CM | POA: Diagnosis not present

## 2015-12-16 DIAGNOSIS — I4891 Unspecified atrial fibrillation: Secondary | ICD-10-CM | POA: Diagnosis not present

## 2015-12-16 DIAGNOSIS — I2510 Atherosclerotic heart disease of native coronary artery without angina pectoris: Secondary | ICD-10-CM | POA: Diagnosis not present

## 2015-12-16 DIAGNOSIS — I10 Essential (primary) hypertension: Secondary | ICD-10-CM | POA: Diagnosis not present

## 2015-12-16 DIAGNOSIS — E785 Hyperlipidemia, unspecified: Secondary | ICD-10-CM | POA: Diagnosis not present

## 2015-12-16 DIAGNOSIS — S72001A Fracture of unspecified part of neck of right femur, initial encounter for closed fracture: Secondary | ICD-10-CM | POA: Diagnosis not present

## 2015-12-16 DIAGNOSIS — I739 Peripheral vascular disease, unspecified: Secondary | ICD-10-CM | POA: Diagnosis not present

## 2015-12-16 DIAGNOSIS — E039 Hypothyroidism, unspecified: Secondary | ICD-10-CM | POA: Diagnosis not present

## 2015-12-16 DIAGNOSIS — S72034A Nondisplaced midcervical fracture of right femur, initial encounter for closed fracture: Secondary | ICD-10-CM | POA: Diagnosis not present

## 2015-12-16 HISTORY — PX: HIP PINNING,CANNULATED: SHX1758

## 2015-12-16 LAB — BASIC METABOLIC PANEL
Anion gap: 7 (ref 5–15)
BUN: 35 mg/dL — AB (ref 6–20)
CALCIUM: 8.9 mg/dL (ref 8.9–10.3)
CHLORIDE: 108 mmol/L (ref 101–111)
CO2: 23 mmol/L (ref 22–32)
CREATININE: 1.62 mg/dL — AB (ref 0.61–1.24)
GFR calc Af Amer: 39 mL/min — ABNORMAL LOW (ref >60)
GFR calc non Af Amer: 34 mL/min — ABNORMAL LOW (ref >60)
GLUCOSE: 100 mg/dL — AB (ref 65–99)
Potassium: 3.6 mmol/L (ref 3.5–5.1)
Sodium: 138 mmol/L (ref 135–145)

## 2015-12-16 LAB — CBC
HCT: 29.3 % — ABNORMAL LOW (ref 40.0–52.0)
Hemoglobin: 10.2 g/dL — ABNORMAL LOW (ref 13.0–18.0)
MCH: 33.2 pg (ref 26.0–34.0)
MCHC: 35 g/dL (ref 32.0–36.0)
MCV: 95 fL (ref 80.0–100.0)
PLATELETS: 170 10*3/uL (ref 150–440)
RBC: 3.08 MIL/uL — AB (ref 4.40–5.90)
RDW: 14.5 % (ref 11.5–14.5)
WBC: 11 10*3/uL — ABNORMAL HIGH (ref 3.8–10.6)

## 2015-12-16 LAB — ABO/RH: ABO/RH(D): O POS

## 2015-12-16 SURGERY — FIXATION, FEMUR, NECK, PERCUTANEOUS, USING SCREW
Anesthesia: Monitor Anesthesia Care | Laterality: Right

## 2015-12-16 MED ORDER — HYDROCODONE-ACETAMINOPHEN 5-325 MG PO TABS
1.0000 | ORAL_TABLET | Freq: Four times a day (QID) | ORAL | Status: DC | PRN
  Administered 2015-12-18 (×2): 1 via ORAL
  Filled 2015-12-16 (×3): qty 1

## 2015-12-16 MED ORDER — ONDANSETRON HCL 4 MG/2ML IJ SOLN: 4.0000 mg | Freq: Four times a day (QID) | INTRAMUSCULAR | Status: DC | PRN

## 2015-12-16 MED ORDER — FLEET ENEMA 7-19 GM/118ML RE ENEM: 1.0000 | ENEMA | Freq: Once | RECTAL | Status: DC | PRN

## 2015-12-16 MED ORDER — BUPIVACAINE-EPINEPHRINE 0.5% -1:200000 IJ SOLN
INTRAMUSCULAR | Status: DC | PRN
  Administered 2015-12-16: 30 mL

## 2015-12-16 MED ORDER — APIXABAN 5 MG PO TABS
2.5000 mg | ORAL_TABLET | Freq: Two times a day (BID) | ORAL | Status: DC
Start: 2015-12-17 — End: 2015-12-18
  Administered 2015-12-17 – 2015-12-18 (×3): 2.5 mg via ORAL
  Filled 2015-12-16 (×3): qty 1

## 2015-12-16 MED ORDER — MENTHOL 3 MG MT LOZG
1.0000 | LOZENGE | OROMUCOSAL | Status: DC | PRN
Start: 2015-12-16 — End: 2015-12-18

## 2015-12-16 MED ORDER — ONDANSETRON HCL 4 MG PO TABS: 4.0000 mg | ORAL_TABLET | Freq: Four times a day (QID) | ORAL | Status: DC | PRN

## 2015-12-16 MED ORDER — OXYCODONE HCL 5 MG/5ML PO SOLN: 5.0000 mg | Freq: Once | ORAL | Status: DC | PRN

## 2015-12-16 MED ORDER — BISACODYL 10 MG RE SUPP: 10.0000 mg | Freq: Every day | RECTAL | Status: DC | PRN

## 2015-12-16 MED ORDER — PHENOL 1.4 % MT LIQD: 1.0000 | OROMUCOSAL | Status: DC | PRN

## 2015-12-16 MED ORDER — OXYCODONE HCL 5 MG PO TABS: 5.0000 mg | ORAL_TABLET | Freq: Once | ORAL | Status: DC | PRN

## 2015-12-16 MED ORDER — DEXTROSE 5 % IV SOLN
1.0000 g | INTRAVENOUS | Status: DC
  Administered 2015-12-17 – 2015-12-18 (×2): 1 g via INTRAVENOUS
  Filled 2015-12-16 (×2): qty 10

## 2015-12-16 MED ORDER — ZOLPIDEM TARTRATE 5 MG PO TABS: 5.0000 mg | ORAL_TABLET | Freq: Every evening | ORAL | Status: DC | PRN

## 2015-12-16 MED ORDER — PHENYLEPHRINE HCL 10 MG/ML IJ SOLN
INTRAMUSCULAR | Status: DC | PRN
  Administered 2015-12-16 (×2): 100 ug via INTRAVENOUS

## 2015-12-16 MED ORDER — FENTANYL CITRATE (PF) 100 MCG/2ML IJ SOLN
INTRAMUSCULAR | Status: DC | PRN
  Administered 2015-12-16: 12.5 ug via INTRAVENOUS
  Administered 2015-12-16: 25 ug via INTRAVENOUS
  Administered 2015-12-16: 12.5 ug via INTRAVENOUS

## 2015-12-16 MED ORDER — MORPHINE SULFATE (PF) 2 MG/ML IV SOLN: 1.0000 mg | INTRAVENOUS | Status: DC | PRN

## 2015-12-16 MED ORDER — MAGNESIUM HYDROXIDE 400 MG/5ML PO SUSP
30.0000 mL | Freq: Every day | ORAL | Status: DC | PRN
  Administered 2015-12-17: 30 mL via ORAL
  Filled 2015-12-16: qty 30

## 2015-12-16 MED ORDER — METOCLOPRAMIDE HCL 10 MG PO TABS: 5.0000 mg | ORAL_TABLET | Freq: Three times a day (TID) | ORAL | Status: DC | PRN

## 2015-12-16 MED ORDER — ACETAMINOPHEN 650 MG RE SUPP: 650.0000 mg | Freq: Four times a day (QID) | RECTAL | Status: DC | PRN

## 2015-12-16 MED ORDER — FERROUS SULFATE 325 (65 FE) MG PO TABS
325.0000 mg | ORAL_TABLET | Freq: Every day | ORAL | Status: DC
  Administered 2015-12-17 – 2015-12-18 (×2): 325 mg via ORAL
  Filled 2015-12-16 (×2): qty 1

## 2015-12-16 MED ORDER — ACETAMINOPHEN 500 MG PO TABS
1000.0000 mg | ORAL_TABLET | Freq: Four times a day (QID) | ORAL | Status: DC
  Administered 2015-12-16 – 2015-12-17 (×2): 1000 mg via ORAL
  Filled 2015-12-16 (×3): qty 2

## 2015-12-16 MED ORDER — SENNA 8.6 MG PO TABS
1.0000 | ORAL_TABLET | Freq: Two times a day (BID) | ORAL | Status: DC
  Administered 2015-12-16 – 2015-12-18 (×4): 8.6 mg via ORAL
  Filled 2015-12-16 (×4): qty 1

## 2015-12-16 MED ORDER — FENTANYL CITRATE (PF) 100 MCG/2ML IJ SOLN
INTRAMUSCULAR | Status: AC
  Filled 2015-12-16: qty 2

## 2015-12-16 MED ORDER — METOCLOPRAMIDE HCL 5 MG/ML IJ SOLN: 5.0000 mg | Freq: Three times a day (TID) | INTRAMUSCULAR | Status: DC | PRN

## 2015-12-16 MED ORDER — QUETIAPINE FUMARATE 25 MG PO TABS
12.5000 mg | ORAL_TABLET | Freq: Every day | ORAL | Status: DC
  Administered 2015-12-16 – 2015-12-17 (×2): 12.5 mg via ORAL
  Filled 2015-12-16 (×2): qty 1

## 2015-12-16 MED ORDER — ACETAMINOPHEN 325 MG PO TABS
650.0000 mg | ORAL_TABLET | Freq: Four times a day (QID) | ORAL | Status: DC | PRN
Start: 2015-12-16 — End: 2015-12-18

## 2015-12-16 MED ORDER — FENTANYL CITRATE (PF) 100 MCG/2ML IJ SOLN
25.0000 ug | INTRAMUSCULAR | Status: DC | PRN
  Administered 2015-12-16: 25 ug via INTRAVENOUS

## 2015-12-16 MED ORDER — ALUM & MAG HYDROXIDE-SIMETH 200-200-20 MG/5ML PO SUSP: 30.0000 mL | ORAL | Status: DC | PRN

## 2015-12-16 MED ORDER — KETAMINE HCL 10 MG/ML IJ SOLN
INTRAMUSCULAR | Status: DC | PRN
  Administered 2015-12-16: 25 mg via INTRAVENOUS
  Administered 2015-12-16: 10 mg via INTRAVENOUS
  Administered 2015-12-16: 20 mg via INTRAVENOUS
  Administered 2015-12-16: 5 mg via INTRAVENOUS
  Administered 2015-12-16: 20 mg via INTRAVENOUS

## 2015-12-16 MED ORDER — CEFAZOLIN SODIUM-DEXTROSE 2-4 GM/100ML-% IV SOLN
2.0000 g | Freq: Four times a day (QID) | INTRAVENOUS | Status: DC
  Administered 2015-12-16: 2 g via INTRAVENOUS
  Filled 2015-12-16 (×2): qty 100

## 2015-12-16 MED ORDER — CEFAZOLIN SODIUM-DEXTROSE 2-3 GM-% IV SOLR
INTRAVENOUS | Status: DC | PRN
  Administered 2015-12-16: 2 g via INTRAVENOUS

## 2015-12-16 MED ORDER — PROPOFOL 500 MG/50ML IV EMUL
INTRAVENOUS | Status: DC | PRN
  Administered 2015-12-16: 15 ug/kg/min via INTRAVENOUS

## 2015-12-16 MED ORDER — SODIUM CHLORIDE 0.45 % IV SOLN
INTRAVENOUS | Status: DC
  Administered 2015-12-16: 15:00:00 via INTRAVENOUS

## 2015-12-16 SURGICAL SUPPLY — 40 items
BAG COUNTER SPONGE EZ (MISCELLANEOUS) ×2 IMPLANT
BIT DRILL CANN 7.3MM (BIT) ×1 IMPLANT
BIT DRILL GRAY 5X250 (MISCELLANEOUS) IMPLANT
CANISTER SUCT 1200ML W/VALVE (MISCELLANEOUS) ×3 IMPLANT
CANNULA 5.75X7 CRYSTAL CLEAR (CANNULA) ×3 IMPLANT
CHLORAPREP W/TINT 26ML (MISCELLANEOUS) ×3 IMPLANT
COUNTER SPONGE BAG EZ (MISCELLANEOUS) ×1
DRILL BIT CANN 7.3MM (BIT) ×3
DRSG AQUACEL AG ADV 3.5X10 (GAUZE/BANDAGES/DRESSINGS) ×3 IMPLANT
ELECT REM PT RETURN 9FT ADLT (ELECTROSURGICAL) ×3
ELECTRODE REM PT RTRN 9FT ADLT (ELECTROSURGICAL) ×1 IMPLANT
GAUZE PETRO XEROFOAM 1X8 (MISCELLANEOUS) ×3 IMPLANT
GAUZE SPONGE 4X4 12PLY STRL (GAUZE/BANDAGES/DRESSINGS) ×6 IMPLANT
GLOVE INDICATOR 8.0 STRL GRN (GLOVE) ×3 IMPLANT
GLOVE SURG ORTHO 8.5 STRL (GLOVE) ×6 IMPLANT
GOWN STRL REUS W/ TWL LRG LVL3 (GOWN DISPOSABLE) ×2 IMPLANT
GOWN STRL REUS W/TWL LRG LVL3 (GOWN DISPOSABLE) ×4
HEMOVAC 400CC 10FR (MISCELLANEOUS) IMPLANT
KIT RM TURNOVER STRD PROC AR (KITS) ×3 IMPLANT
MAT BLUE FLOOR 46X72 FLO (MISCELLANEOUS) ×3 IMPLANT
NEEDLE FILTER BLUNT 18X 1/2SAF (NEEDLE) ×2
NEEDLE FILTER BLUNT 18X1 1/2 (NEEDLE) ×1 IMPLANT
NEEDLE HYPO 18GX1.5 BLUNT FILL (NEEDLE) IMPLANT
NEEDLE SPNL 18GX3.5 QUINCKE PK (NEEDLE) ×3 IMPLANT
PACK HIP COMPR (MISCELLANEOUS) ×3 IMPLANT
SCREW 7.3 CANN CON 85 (Screw) ×6 IMPLANT
SCREW 7.3 CANN CON 90 (Screw) ×3 IMPLANT
SCREW 7.3 CANN CON 95 (Screw) ×3 IMPLANT
SOL PREP PVP 2OZ (MISCELLANEOUS) ×3
SOLUTION PREP PVP 2OZ (MISCELLANEOUS) ×1 IMPLANT
STAPLER SKIN PROX 35W (STAPLE) IMPLANT
SUCTION FRAZIER HANDLE 10FR (MISCELLANEOUS)
SUCTION TUBE FRAZIER 10FR DISP (MISCELLANEOUS) IMPLANT
SUT ETHILON 3 0 FSLX (SUTURE) ×3 IMPLANT
SUT QUILL PDO 0 36 36 VIOLET (SUTURE) IMPLANT
SUT QUILL PDO 2 24X24 VLT (SUTURE) IMPLANT
SYR 30ML LL (SYRINGE) ×3 IMPLANT
SYRINGE 10CC LL (SYRINGE) IMPLANT
TAPE MICROFOAM 4IN (TAPE) ×6 IMPLANT
WIRE K 2.8X14 THR (MISCELLANEOUS) ×12 IMPLANT

## 2015-12-16 NOTE — OR Nursing (Signed)
Patient given 50 mcg per CRNA on arrival when rolling patient caused some discomfort.

## 2015-12-16 NOTE — Clinical Social Work Placement (Signed)
   CLINICAL SOCIAL WORK PLACEMENT  NOTE  Date:  12/16/2015  Patient Details  Name: Jacob [name] MRN: [id number] Date of Birth: [birth year]  Clinical Social Work is seeking post-discharge placement for this patient at the Skilled  Nursing Facility level of care (*CSW will initial, date and re-position this form in  chart as items are completed):  Yes   Patient/family provided with [facility] Clinical Social Work Department's list of facilities offering this level of care within the geographic area requested by the patient (or if unable, by the patient's family).  Yes   Patient/family informed of their freedom to choose among providers that offer the needed level of care, that participate in Medicare, Medicaid or managed care program needed by the patient, have an available bed and are willing to accept the patient.  Yes   Patient/family informed of [facility]'s ownership interest in [facility] and [facility], as well as of the fact that they are under no obligation to receive care at these facilities.  PASRR submitted to EDS on       PASRR number received on       Existing PASRR number confirmed on 12/16/15     FL2 transmitted to all facilities in geographic area requested by pt/family on 12/16/15     FL2 transmitted to all facilities within larger geographic area on       Patient informed that his/her managed care company has contracts with or will negotiate with certain facilities, including the following:            Patient/family informed of bed offers received.  Patient chooses bed at       Physician recommends and patient chooses bed at      Patient to be transferred to   on  .  Patient to be transferred to facility by       Patient family notified on   of transfer.  Name of family member notified:        PHYSICIAN Please sign FL2     Additional Comment:    _______________________________________________ Raye Sorrowoble, Yoshio Seliga N, LCSW 12/16/2015, 3:21  PM

## 2015-12-16 NOTE — Anesthesia Preprocedure Evaluation (Addendum)
Anesthesia Evaluation  Patient identified by MRN, date of birth, ID band Patient confused    Reviewed: Allergy & Precautions, H&P , NPO status , Patient's Chart, lab work & pertinent test results  History of Anesthesia Complications Negative for: history of anesthetic complications  Airway Mallampati: III  TM Distance: >3 FB Neck ROM: limited    Dental  (+) Poor Dentition, Missing, Upper Dentures, Lower Dentures   Pulmonary neg pulmonary ROS, neg shortness of breath,    Pulmonary exam normal breath sounds clear to auscultation       Cardiovascular Exercise Tolerance: Poor hypertension, (-) angina+ CAD, + Past MI, + Peripheral Vascular Disease and + DOE  Normal cardiovascular exam Rhythm:regular Rate:Normal     Neuro/Psych PSYCHIATRIC DISORDERS CVA, Residual Symptoms    GI/Hepatic negative GI ROS, Neg liver ROS,   Endo/Other  Hypothyroidism   Renal/GU negative Renal ROS  negative genitourinary   Musculoskeletal   Abdominal   Peds  Hematology negative hematology ROS (+)   Anesthesia Other Findings Past Medical History:   CAD in native artery -->                        1994           Comment:referred for CABG   S/P CABG x 4                                    1994           Comment:a) 1994: LIMA-LAD, SVG-RI, SVG-OM, SVG-RCA; b)               09/2006, Persantine Myoview: fixed inferior               defect consistent with infarct/scar without               peri-infarct ischemia;; 2-D echo: Mild-mod               concentric LVH. Normal EF.  Abnormal               relaxation, aortic sclerosis with mild-mod AI   Chronic atrial fibrillation (HCC)               12/15/2012    Long term (current) use of anticoagulants       12/15/2012      Comment:warfarin   Essential hypertension                          05/23/2013    Dyslipidemia, goal LDL below 70(272.4)                         Comment: - on statin, followed by primary  physician.    Glaucoma                                                     History of kidney stones                                     H/O seasonal allergies  Diverticulitis                                               C4 cervical fracture (HCC)                      2015           Comment:with spinal cord contusion - stopped coumadin.   Acute right MCA stroke (HCC)                    09/23/2015    Acute ischemic left MCA stroke (HCC)            10/2015      Past Surgical History:   CORONARY ARTERY BYPASS GRAFT                     1994           Comment:LIMA-LAD, SVG-RI, SVG-OM, SVG-RCA    Persantine Myoview                               February *     Comment:Fixed inferior wall defect-consistent with               infarct/scar without peri-infarct ischemia   TRANSTHORACIC ECHOCARDIOGRAM                     February *     Comment:Mild/mod conc LVH, Nl Size & Fxn, Gr 1               DD(abnormal relaxation), mild to moderate               aortic regurgitation and mildly sclerotic               aortic valve.   APPENDECTOMY                                                  HAND SURGERY                                    Left 1985         FEMUR SURGERY                                   Left 2000           Comment:replacement- due to fall  BMI    Body Mass Index   22.86 kg/m 2      Reproductive/Obstetrics negative OB ROS                           Anesthesia Physical Anesthesia Plan  ASA: IV  Anesthesia Plan: MAC and General   Post-op Pain Management:    Induction:   Airway Management Planned: Video Laryngoscope Planned  Additional Equipment:   Intra-op Plan:   Post-operative Plan:   Informed Consent: I have reviewed the patients History and Physical, chart, labs and discussed  the procedure including the risks, benefits and alternatives for the proposed anesthesia with the patient or authorized representative who  has indicated his/her understanding and acceptance.   Dental Advisory Given  Plan Discussed with: Anesthesiologist, CRNA and Surgeon  Anesthesia Plan Comments: (Daughter consented.  She was informed that patient is higher risk for complications from anesthesia during this procedure due to their medical history, including but not limited to re stroke, stroke progression and dementia progression.  Daughter voiced understanding.  Discussion with surgeon, plan to attempt this procedure with MAC and local.  Patient has medical clearance for this procedure.)       Anesthesia Quick Evaluation

## 2015-12-16 NOTE — Progress Notes (Signed)
Clinical Social Work aware of consult and following patient. Patient currently in surgery and LCSW will follow up once patient able to participate in assessment with family. PT order is necessary to discuss SNF option and plan at DC.  Will follow and assist as needed with discharge planning/disposition.    Jacob EmoryHannah Zennie Ayars LCSW, MSW Clinical Social Work: [facility] [employer] [phone]

## 2015-12-16 NOTE — Op Note (Signed)
12/15/2015 - 12/16/2015  1:47 PM  PATIENT:  Jacob [name]    PRE-OPERATIVE DIAGNOSIS:  hip fracture right impacted femoral neck  POST-OPERATIVE DIAGNOSIS:  Same  PROCEDURE:  CANNULATED HIP PINNING RIGHT HIP (4 7.3 SCREWS)  SURGEON:  Valinda HoarMILLER,Camree Wigington E, MD     ANESTHESIA:  MAC  PREOPERATIVE INDICATIONS:  Jacob [name] is a  82 y.o. male who fell and was found to have a diagnosis of hip fracture who elected for surgical management.    The risks benefits and alternatives were discussed with the patient preoperatively including but not limited to the risks of infection, bleeding, nerve injury, cardiopulmonary complications, blood clots, malunion, nonunion, avascular necrosis, the need for revision surgery, the potential for conversion to hemiarthroplasty, among others, and the patient was willing to proceed.  OPERATIVE IMPLANTS: 7.3 mm cannulated screws x4  OPERATIVE FINDINGS: Clinical osteoporosis with weak bone, proximal femur  OPERATIVE PROCEDURE: The patient was brought to the operating room and placed in supine position. IV antibiotics were given. MAC anesthesia administered.  The patient was placed on the fracture table. The operative extremity was positioned, without any significant reduction maneuver and was prepped and draped in usual sterile fashion.  Time out was performed.  Small incision was made distal to the greater trochanter, and 4 guidewires were introduced into the head and neck. The lengths were measured. The reduction was slightly valgus, and near-anatomic. I opened the cortex with a cannulated drill, and then placed the screws into position. Satisfactory fixation was achieved.  The wounds were irrigated copiously, and repaired with  3-O Nylon with and sterile gauze. Sponge and needle count were correct.  There no complications and the patient tolerated the procedure well.  The patient will be touch down weightbearing as tolerated, and will be onEliquis  for DVT  prophylaxis.   Valinda HoarHoward E Lailee Hoelzel, MD

## 2015-12-16 NOTE — Progress Notes (Signed)
PT Cancellation Note  Patient Details Name: Jacob SloopRamon F [name] MRN: [id number] DOB: [birth year]   Cancelled Treatment:    Reason Eval/Treat Not Completed: Medical issues which prohibited therapy. Chart reviewed and RN consulted. Pt is scheduled for surgery today. Will need new order for PT following surgery with WB status. Please enter new order after surgery in order to initiate therapy.  Sharalyn InkJason D Django Nguyen PT, DPT   Sera Hitsman 12/16/2015, 9:33 AM

## 2015-12-16 NOTE — NC FL2 (Signed)
[place] MEDICAID FL2 LEVEL OF CARE SCREENING TOOL     IDENTIFICATION  Patient Name: Jacob [name] Birthdate: August 30, 1916 Sex: male Admission Date (Current Location): 12/15/2015  [place]   Facility and Address:  [facility], [address], [place], [place] [address]      Provider Number: [id number]  Attending Physician Name and Address:  Alford Highlandichard Elise Gladden, MD  Relative Name and Phone Number:       Current Level of Care: Hospital Recommended Level of Care: Skilled Nursing Facility Prior Approval Number:    Date Approved/Denied:   PASRR Number: [id number] [phone] [id number]  Discharge Plan: SNF    Current Diagnoses: Patient Active Problem List   Diagnosis Date Noted  . Hip fracture (HCC) 12/15/2015  . Acute embolic stroke within last 8 weeks (HCC) 10/28/2015  . Anemia 09/24/2015  . Urinary urgency 02/24/2015  . Hypothyroidism 08/02/2014  . Dysphagia 08/02/2014  . Essential hypertension 05/23/2013  . CAD in native artery -->  CABG x4 in 1994   . 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 Dyslipidemia, goal LDL below 70 - on statin, followed by primary physician.   . Chronic atrial fibrillation (HCC) 12/15/2012    Class: Chronic    Orientation RESPIRATION BLADDER Height & Weight     Self  O2 (2L)  incontient Weight: 146 lb (66.225 kg) Height:  5\' 7"  (170.2 cm)  BEHAVIORAL SYMPTOMS/MOOD NEUROLOGICAL BOWEL NUTRITION STATUS       incontient Diet (regular)  AMBULATORY STATUS COMMUNICATION OF NEEDS Skin   Extensive Assist Verbally (needs interpreter) Surgical wounds                       Personal Care Assistance Level of Assistance  Bathing, Feeding, Dressing Bathing Assistance: Maximum assistance Feeding assistance: Limited assistance Dressing Assistance: Maximum assistance     Functional Limitations Info  Sight, Hearing, Speech Sight Info: Adequate Hearing Info:  Adequate Speech Info: Adequate    SPECIAL CARE FACTORS FREQUENCY  PT (By licensed PT), OT (By licensed OT)     PT Frequency: 5 OT Frequency: 5            Contractures Contractures Info: Not present    Additional Factors Info  Allergies, Code Status, Psychotropic, Insulin Sliding Scale, Isolation Precautions Code Status Info: Full Code Allergies Info: NKA Psychotropic Info: none Insulin Sliding Scale Info: none Isolation Precautions Info: none     Current Medications (12/16/2015):  This is the current hospital active medication list Current Facility-Administered Medications  Medication Dose Route Frequency Provider Last Rate Last Dose  . 0.45 % sodium chloride infusion   Intravenous Continuous Deeann SaintHoward Miller, MD 75 mL/hr at 12/16/15 1435    . 0.9 %  sodium chloride infusion   Intravenous Continuous Houston SirenVivek J Sainani, MD      . acetaminophen (TYLENOL) tablet 650 mg  650 mg Oral Q6H PRN Houston SirenVivek J Sainani, MD       Or  . acetaminophen (TYLENOL) suppository 650 mg  650 mg Rectal Q6H PRN Houston SirenVivek J Sainani, MD      . cefTRIAXone (ROCEPHIN) 1 g in dextrose 5 % 50 mL IVPB  1 g Intravenous Q24H Houston SirenVivek J Sainani, MD   1 g at 12/15/15 2253  . fentaNYL (SUBLIMAZE) 100 MCG/2ML injection           . latanoprost (XALATAN) 0.005 % ophthalmic solution 1 drop  1 drop Both Eyes QHS Houston Siren, MD   1 drop at 12/15/15 2042  . levothyroxine (SYNTHROID, LEVOTHROID) tablet 88 mcg  88 mcg Oral QAC breakfast Houston Siren, MD   88 mcg at 12/16/15 0846  . morphine 2 MG/ML injection 1 mg  1 mg Intravenous Q3H PRN Houston Siren, MD   1 mg at 12/15/15 2045  . ondansetron (ZOFRAN) tablet 4 mg  4 mg Oral Q6H PRN Houston Siren, MD       Or  . ondansetron (ZOFRAN) injection 4 mg  4 mg Intravenous Q6H PRN Houston Siren, MD      . oxyCODONE (Oxy IR/ROXICODONE) immediate release tablet 5 mg  5 mg Oral Q4H PRN Houston Siren, MD      . simvastatin (ZOCOR) tablet 20 mg  20 mg Oral QHS Houston Siren, MD    20 mg at 12/15/15 2042     Discharge Medications: Please see discharge summary for a list of discharge medications.  Relevant Imaging Results:  Relevant Lab Results:   Additional Information SS # [id number]  Raye Sorrow, LCSW

## 2015-12-16 NOTE — Care Management Note (Signed)
Case Management Note  Patient Details  Name: JEREMYAH JELLEY MRN: [id number] Date of Birth: 03-18-1917  Subjective/Objective:                  Met with patient's daughter, patient's spouse, and patient. Patient per daughter has been confused since about 3 weeks ago (CVA). He is non-English speaking. He recently discharged to home with [facility] home health referral but refused services due to co-pay/costs. He has a walker and a cane "but refuses to use them". He lives with his wife, daughter and her family. His wife is with him in the home but daughter states that he needs a higher level of care since the CVA. She states that DR. Sabra Heck told her that patient would go to SNF/rehab for about 6 weeks after surgery (12/16/15) however, I had to explained that his health insurance would need to approve rehab and would need to be medically necessary for continued rehab as it usually only last around 2 weeks and then home. She wants long-term care which is private pay. He does not have Medicaid. His PCP is with [facility].   Action/Plan: List of home health agencies left with daughter. PT pending. Insurance auth pending. Daughter advised to apply for Medicaid.   Expected Discharge Date:  12/18/15               Expected Discharge Plan:     In-House Referral:     Discharge planning Services  CM Consult  Post Acute Care Choice:    Choice offered to:  Spouse, Adult Children  DME Arranged:    DME Agency:     HH Arranged:    HH Agency:  [facility]  Status of Service:  In process, will continue to follow  Medicare Important Message Given:    Date Medicare IM Given:    Medicare IM give by:    Date Additional Medicare IM Given:    Additional Medicare Important Message give by:     If discussed at [place] of Stay Meetings, dates discussed:    Additional Comments:  Marshell Garfinkel, RN 12/16/2015, 10:50 AM

## 2015-12-16 NOTE — Progress Notes (Signed)
Patient ID: Jacob [name], male   DOB: [birth year], 82 y.o.   MRN: [id number] [facility] PROGRESS NOTE  Jacob [name] YQM:[id number] DOB: 10/04/1916 DOA: 12/15/2015 PCP: Eustaquio Boyden, MD  HPI/Subjective: Patient offers no complaints. States he has no pain. Upset that he has to stay in the hospital until Saturday. Upset that he will likely need rehabilitation.  Objective: Filed Vitals:   12/16/15 1505 12/16/15 1542  BP: 164/88 148/83  Pulse: 88 98  Temp: 98.3 F (36.8 C) 98.4 F (36.9 C)  Resp: 18 18    Filed Weights   12/15/15 1005  Weight: 66.225 kg (146 lb)    ROS: Review of Systems  Constitutional: Negative for fever and chills.  Eyes: Negative for blurred vision.  Respiratory: Negative for cough and shortness of breath.   Cardiovascular: Negative for chest pain.  Gastrointestinal: Negative for nausea, vomiting, abdominal pain, diarrhea and constipation.  Genitourinary: Negative for dysuria.  Musculoskeletal: Negative for joint pain.  Neurological: Negative for dizziness and headaches.   Exam: Physical Exam  HENT:  Nose: No mucosal edema.  Mouth/Throat: No oropharyngeal exudate or posterior oropharyngeal edema.  Eyes: Conjunctivae, EOM and lids are normal. Pupils are equal, round, and reactive to light.  Neck: No JVD present. Carotid bruit is not present. No edema present. No thyroid mass and no thyromegaly present.  Cardiovascular: S1 normal and S2 normal.  An irregularly irregular rhythm present. Exam reveals no gallop.   Murmur heard.  Systolic murmur is present with a grade of 3/6  Pulses:      Dorsalis pedis pulses are 2+ on the right side, and 2+ on the left side.  Respiratory: No respiratory distress. He has no wheezes. He has no rhonchi. He has no rales.  GI: Soft. Bowel sounds are normal. There is no tenderness.  Musculoskeletal:       Right ankle: He exhibits swelling.       Left ankle: He exhibits swelling.  Lymphadenopathy:    He has no  cervical adenopathy.  Neurological: He is alert. No cranial nerve deficit.  Skin: Skin is warm. No rash noted. Nails show no clubbing.  Psychiatric: He has a normal mood and affect.      Data Reviewed: Basic Metabolic Panel:  Recent Labs Lab 12/15/15 1207 12/16/15 0632  NA 141 138  K 4.0 3.6  CL 108 108  CO2 23 23  GLUCOSE 165* 100*  BUN 37* 35*  CREATININE 1.63* 1.62*  CALCIUM 9.8 8.9   CBC:  Recent Labs Lab 12/15/15 1207 12/16/15 0632  WBC 18.5* 11.0*  NEUTROABS 17.7*  --   HGB 11.2* 10.2*  HCT 32.7* 29.3*  MCV 94.6 95.0  PLT 210 170   Cardiac Enzymes:  Recent Labs Lab 12/15/15 1207  TROPONINI 0.07*    CBG:  Recent Labs Lab 12/15/15 2326  GLUCAP 132*    Recent Results (from the past 240 hour(s))  Urine culture     Status: Abnormal (Preliminary result)   Collection Time: 12/15/15 12:07 PM  Result Value Ref Range Status   Specimen Description URINE, RANDOM  Final   Special Requests NONE  Final   Culture (A)  Final    >=100,000 COLONIES/mL GRAM NEGATIVE RODS IDENTIFICATION AND SUSCEPTIBILITIES TO FOLLOW    Report Status PENDING  Incomplete  Culture, blood (routine x 2)     Status: None (Preliminary result)   Collection Time: 12/15/15  1:00 PM  Result Value Ref Range Status   Specimen Description BLOOD RIGHT  ASSIST CONTROL  Final   Special Requests BOTTLES DRAWN AEROBIC AND ANAEROBIC  5CC  Final   Culture NO GROWTH < 24 HOURS  Final   Report Status PENDING  Incomplete  Culture, blood (routine x 2)     Status: None (Preliminary result)   Collection Time: 12/15/15  1:05 PM  Result Value Ref Range Status   Specimen Description BLOOD LEFT ASSIST CONTROL  Final   Special Requests   Final    BOTTLES DRAWN AEROBIC AND ANAEROBIC  AERO 5CC ANA 1CC   Culture NO GROWTH < 24 HOURS  Final   Report Status PENDING  Incomplete     Studies: Ct Head Wo Contrast  12/15/2015  CLINICAL DATA:  Pain following fall EXAM: CT HEAD WITHOUT CONTRAST TECHNIQUE:  Contiguous axial images were obtained from the base of the skull through the vertex without intravenous contrast. COMPARISON:  November 23, 2015 head CT; brain MRI November 20, 2015 FINDINGS: Mild diffuse atrophy is stable. There has been further evolution of the left frontal infarct noted previously. There is no new infarct apparent. There is small vessel disease in the centra semiovale bilaterally as well as in the posterior limb of the left internal capsule. There is no mass, hemorrhage, extra-axial fluid collection, or midline shift. The bony calvarium appears intact. The visualized mastoid air cells are clear. Visualized intraorbital regions appear normal bilaterally. IMPRESSION: Atrophy with small vessel disease. Evolution of recent right frontal lobe infarct. No new new infarct. No hemorrhage or extra-axial fluid collection. No midline shift or mass effect. Electronically Signed   By: Bretta Bang III M.D.   On: 12/15/2015 10:47   Ct Hip Right Wo Contrast  12/15/2015  CLINICAL DATA:  Larey Seat last evening.  Right hip pain. EXAM: CT OF THE RIGHT HIP WITHOUT CONTRAST TECHNIQUE: Multidetector CT imaging of the right hip was performed according to the standard protocol. Multiplanar CT image reconstructions were also generated. COMPARISON:  Radiographs 08/01/2014. FINDINGS: There is an impacted subcapital and high femoral neck fracture with medial and posterior rotation of the femoral head. No acetabular fracture is identified. The visualized aspect of the right sacrum is intact. SI joint degenerative changes are noted. There also moderate hip joint degenerative changes with joint space narrowing, osteophytic spurring, subchondral cystic change and chondrocalcinosis. The pubic symphysis is intact. No pubic rami fractures are identified. No significant intrapelvic abnormalities are identified. A right inguinal hernia is noted containing fat. Moderate atherosclerotic calcifications involving the common femoral artery.  IMPRESSION: Impacted and rotated high femoral neck/subcapital fracture of the right hip. Moderate hip joint degenerative changes and chondrocalcinosis. The visualized bony pelvis is intact. Electronically Signed   By: Rudie Meyer M.D.   On: 12/15/2015 12:01   Dg Hip Operative Unilat W Or W/o Pelvis Right  12/16/2015  CLINICAL DATA:  Hip pinning EXAM: OPERATIVE RIGHT HIP (WITH PELVIS IF PERFORMED) 2 VIEWS TECHNIQUE: Fluoroscopic spot image(s) were submitted for interpretation post-operatively. FLUOROSCOPY TIME:  59 seconds COMPARISON:  CT right hip dated 12/15/2015 FINDINGS: Intraoperative fluoroscopic radiographs during ORIF of the right hip, with 3 cannulated cancellous screws transfixing a subcapital right hip fracture, in near anatomic alignment and position. IMPRESSION: Intraoperative fluoroscopic radiographs during ORIF of the right hip, as above. Electronically Signed   By: Charline Bills M.D.   On: 12/16/2015 13:57   Dg Hip Unilat  With Pelvis 2-3 Views Right  12/15/2015  CLINICAL DATA:  Right hip pain after falling last night. EXAM: DG HIP (WITH OR WITHOUT  PELVIS) 2-3V RIGHT COMPARISON:  Radiographs 08/01/2014. FINDINGS: The bones are demineralized. A skin fold overlies the right femoral neck. However, there is concern of a nondisplaced femoral neck fracture. Patient is status post left proximal femoral dynamic screw and intramedullary nail fixation. No evidence of acute pelvic fracture or dislocation. Vascular calcifications are noted. IMPRESSION: 1. Concern of nondisplaced right femoral neck fracture. This could be confirmed with CT if clinically doubted. 2. Previous proximal left femoral ORIF. Electronically Signed   By: Carey BullocksWilliam  Veazey M.D.   On: 12/15/2015 11:15    Scheduled Meds: . acetaminophen  1,000 mg Oral Q6H  . [START ON 12/17/2015] apixaban  2.5 mg Oral BID  .  ceFAZolin (ANCEF) IV  2 g Intravenous Q6H  . fentaNYL      . [START ON 12/17/2015] ferrous sulfate  325 mg Oral Q  breakfast  . senna  1 tablet Oral BID   Continuous Infusions: . sodium chloride 75 mL/hr at 12/16/15 1435    Assessment/Plan:  1. Closed her right hip fracture initial encounter requiring operative repair. Pain control as needed.Physical therapy evaluation.Likely out to rehabilitation on Saturday if all goes well postoperatively. Patient looks well postoperatively today. 2. Urine culture growing gram-negative  Rods. Start Rocephin tomorrow, has ancef today. 3. Bladder seems a little distended. If he is unable to urinate may need to do a  Bladder scan and an in and out catheter. 4. Atrial fibrillation with history of stroke. Stroke risk higher off eliquis. 5. Hypothyroidism unspecified continue Synthroid 6. Hyperlipidemia unspecified continue Zocor  Code Status:  Code Status History    Date Active Date Inactive Code Status Order ID Comments User Context   12/15/2015  3:47 PM 12/16/2015  3:28 PM Full Code [id number]  Houston SirenVivek J Sainani, MD Inpatient   11/20/2015  1:43 PM 11/24/2015  3:01 PM Full Code [id number]  Marcos EkeSara E Wertman, PA-C ED   09/24/2015  1:26 AM 09/24/2015  7:51 PM Full Code [id number]  Arnaldo NatalMichael S Diamond, MD Inpatient   08/01/2014 11:59 PM 08/06/2014  7:41 PM DNR [id number]  Therisa DoyneAnastassia Doutova, MD Inpatient     Family Communication: daughter at length  Disposition Plan: Rehab on Saturday  Consultants:  Orthopedic surgery  Antibiotics:  Ancef today, rocephin tomorrow  Time spent: 35 minutes  Alford HighlandWIETING, Rafay Dahan  [employer]

## 2015-12-16 NOTE — Anesthesia Procedure Notes (Signed)
Date/Time: 12/16/2015 12:29 PM Performed by: Henrietta HooverPOPE, Keeana Pieratt Pre-anesthesia Checklist: Patient identified, Emergency Drugs available, Patient being monitored and Timeout performed Patient Re-evaluated:Patient Re-evaluated prior to inductionOxygen Delivery Method: Simple face mask Intubation Type: IV induction

## 2015-12-16 NOTE — Transfer of Care (Signed)
Immediate Anesthesia Transfer of Care Note  Patient: Jacob SloopRamon F Capetillo  Procedure(s) Performed: Procedure(s): CANNULATED HIP PINNING (Right)  Patient Location: PACU  Anesthesia Type:MAC  Level of Consciousness: sedated  Airway & Oxygen Therapy: Patient Spontanous Breathing and Patient connected to face mask oxygen  Post-op Assessment: Report given to RN and Post -op Vital signs reviewed and stable  Post vital signs: Reviewed and stable  Last Vitals:  Filed Vitals:   12/16/15 1028 12/16/15 1353  BP: 143/73 165/86  Pulse: 78 82  Temp: 36.1 C 36.3 C  Resp: 16 16    Complications: No apparent anesthesia complications

## 2015-12-16 NOTE — Progress Notes (Signed)
Interpreter at bedside with family , Pts wife has requested an interpreter to go to the OR with Pt.

## 2015-12-16 NOTE — Discharge Instructions (Signed)
You have found to have a right hip fracture, and we discussed the option of surgery versus no surgery and chose no surgery.  Minimal or no weightbearing to the right lower extremity. May be moved from bed to chair.  Return to the emergency department for any worsening pain, numbness or weakness, or any other symptoms concerning to you.   Fractura de cadera (Hip Fracture) Neomia DearUna fractura de cadera es una fractura en la parte superior del hueso del muslo (fmur).  CAUSAS Un golpe directo al costado de la cadera ocasiona una fractura de cadera. Esto normalmente ocurre debido a una cada, pero puede ocurrir en otras circunstancias, como un accidente Covingtonautomovilstico. FACTORES DE RIESGO Hay un mayor riesgo de fractura de cadera en personas con lo siguiente:  Un patrn de caminata irregular (marcha) y personas con enfermedades que generan problemas de equilibrio, como el Parkinson o la demencia.  Osteopenia y osteoporosis.  Cncer que se propaga a los Marriotthuesos de las piernas.  Ciertas enfermedades metablicas. SNTOMAS  Los sntomas de fractura de cadera incluyen lo siguiente:  Dolor en la cadera lesionada.  Imposibilidad de Consulting civil engineercolocar el peso en la pierna donde ocurri la fractura (aunque algunos pacientes pueden caminar despus de una fractura de cadera).  Los pies y los dedos del pie de la pierna afectada apuntan hacia afuera cuando se recuesta. DIAGNSTICO Un examen fsico podr determinar si es probable que se haya producido una fractura. Se necesitan radiografas para confirmar la fractura y buscar otras lesiones. La radiografa puede ayudar a determinar el tipo de fractura de cadera. Pocas veces, la fractura no es visible en una radiografa y se deben realizar una tomografa computada o una resonancia magntica. TRATAMIENTO  El tratamiento para una fractura normalmente es la Azerbaijanciruga. Esto significa usar un tornillo, un clavo o una varilla para sostener los Airline pilothuesos en su lugar.  INSTRUCCIONES  PARA EL CUIDADO EN EL HOGAR Tome todos los medicamentos como le indic el mdico. SOLICITE ATENCIN MDICA SI: El dolor contina, aun despus de haber tomado analgsicos. ASEGRESE DE QUE:  Comprende estas instrucciones.  Controlar su afeccin.  Recibir ayuda de inmediato si no mejora o si empeora.   Esta informacin no tiene Theme park managercomo fin reemplazar el consejo del mdico. Asegrese de hacerle al mdico cualquier pregunta que tenga.   Document Released: 08/15/2005 Document Revised: 08/20/2013 Elsevier Interactive Patient Education [employer]2016 Elsevier Inc.

## 2015-12-17 ENCOUNTER — Encounter: Payer: Self-pay | Admitting: Specialist

## 2015-12-17 DIAGNOSIS — I4891 Unspecified atrial fibrillation: Secondary | ICD-10-CM | POA: Diagnosis not present

## 2015-12-17 DIAGNOSIS — E039 Hypothyroidism, unspecified: Secondary | ICD-10-CM | POA: Diagnosis not present

## 2015-12-17 DIAGNOSIS — S72001A Fracture of unspecified part of neck of right femur, initial encounter for closed fracture: Secondary | ICD-10-CM | POA: Diagnosis not present

## 2015-12-17 DIAGNOSIS — N3001 Acute cystitis with hematuria: Secondary | ICD-10-CM | POA: Diagnosis not present

## 2015-12-17 LAB — CBC
HEMATOCRIT: 26.8 % — AB (ref 40.0–52.0)
HEMOGLOBIN: 9.4 g/dL — AB (ref 13.0–18.0)
MCH: 33.1 pg (ref 26.0–34.0)
MCHC: 35 g/dL (ref 32.0–36.0)
MCV: 94.5 fL (ref 80.0–100.0)
Platelets: 154 10*3/uL (ref 150–440)
RBC: 2.83 MIL/uL — ABNORMAL LOW (ref 4.40–5.90)
RDW: 14.1 % (ref 11.5–14.5)
WBC: 8.8 10*3/uL (ref 3.8–10.6)

## 2015-12-17 LAB — BASIC METABOLIC PANEL
ANION GAP: 8 (ref 5–15)
BUN: 41 mg/dL — ABNORMAL HIGH (ref 6–20)
CALCIUM: 8.8 mg/dL — AB (ref 8.9–10.3)
CHLORIDE: 108 mmol/L (ref 101–111)
CO2: 24 mmol/L (ref 22–32)
CREATININE: 1.84 mg/dL — AB (ref 0.61–1.24)
GFR calc non Af Amer: 29 mL/min — ABNORMAL LOW (ref >60)
GFR, EST AFRICAN AMERICAN: 34 mL/min — AB (ref >60)
Glucose, Bld: 93 mg/dL (ref 65–99)
Potassium: 3.7 mmol/L (ref 3.5–5.1)
SODIUM: 140 mmol/L (ref 135–145)

## 2015-12-17 NOTE — Clinical Social Work Note (Signed)
Clinical Social Work Assessment  Patient Details  Name: Jacob [name] MRN: [id number] Date of Birth: Sep 08, 1916  Date of referral:  12/17/15               Reason for consult:  Facility Placement                Permission sought to share information with:  Chartered certified accountant granted to share information::  Yes, Verbal Permission Granted  Name::      Clinical cytogeneticist::   [facility] [place])   Relationship::     Contact Information:     Housing/Transportation Living arrangements for the past 2 months:  Single Family Home Source of Information:  Adult Children, Spouse Patient Interpreter Needed:  YES (Spanish)  Criminal Activity/Legal Involvement Pertinent to Current Situation/Hospitalization:  No - Comment as needed Significant Relationships:  Adult Children, Spouse Lives with:  Adult Children, Spouse Do you feel safe going back to the place where you live?  Yes Need for family participation in patient care:  Yes (Comment)  Care giving concerns:  Patient lives in [place] with her wife, daughter, son in Sports coach and grandchildren.    Social Worker assessment / plan:  Holiday representative (CSW) received SNF consult. PT is recommending SNF. CSW met with patient and his wife and daughter Inez Catalina were at bedside. CSW utilized a Nutritional therapist. CSW introduced self and explained role of CSW department. Per daughter patient and his wife live with her and her husband in [place]. Daughter is agreeable to SNF search and prefers [facility]. Per daughter patient has been to [facility] in the past. CSW also discussed long term care options with daughter. Per daughter she is already working with Vance Gather on the [facility] application.   CSW presented bed offers. Daughter chose [facility]. Admissions coordinator at [facility] is aware of accepted bed offer. CSW left a Advertising account executive for [facility] Team case  manager. CSW will continue to follow and assist as needed.   Employment status:  Disabled (Comment on whether or not currently receiving Disability), Retired Nurse, adult PT Recommendations:  [place] / Referral to community resources:  [place]  Patient/Family's Response to care:  Patient's daughter and his wife are agreeable for patient to go to [facility].   Patient/Family's Understanding of and Emotional Response to Diagnosis, Current Treatment, and Prognosis:  Daughter was pleasant and thanked CSW for visit.   Emotional Assessment Appearance:  Appears stated age Attitude/Demeanor/Rapport:  Unable to Assess Affect (typically observed):  Unable to Assess Orientation:  Fluctuating Orientation (Suspected and/or reported Sundowners) Alcohol / Substance use:  Not Applicable Psych involvement (Current and /or in the community):  No (Comment)  Discharge Needs  Concerns to be addressed:  Discharge Planning Concerns Readmission within the last 30 days:  No Current discharge risk:  Dependent with Mobility Barriers to Discharge:  Continued Medical Work up   Loralyn Freshwater, LCSW 12/17/2015, 5:27 PM

## 2015-12-17 NOTE — Progress Notes (Signed)
Subjective: 1 Day Post-Op Procedure(s) (LRB): CANNULATED HIP PINNING (Right)    Patient reports pain as mild. OOB in chair.  Alert and comfortable.  Started PT  Objective:   VITALS:   Filed Vitals:   12/17/15 0454 12/17/15 0735  BP: 108/71 120/55  Pulse: 68 69  Temp: 97.6 F (36.4 C) 97.5 F (36.4 C)  Resp: 18 16    Neurologically intact ABD soft Neurovascular intact Sensation intact distally Intact pulses distally Dorsiflexion/Plantar flexion intact Dressing dry  LABS  Recent Labs  12/15/15 1207 12/16/15 0632 12/17/15 0601  HGB 11.2* 10.2* 9.4*  HCT 32.7* 29.3* 26.8*  WBC 18.5* 11.0* 8.8  PLT 210 170 154     Recent Labs  12/15/15 1207 12/16/15 0632 12/17/15 0601  NA 141 138 140  K 4.0 3.6 3.7  BUN 37* 35* 41*  CREATININE 1.63* 1.62* 1.84*  GLUCOSE 165* 100* 93     Recent Labs  12/15/15 2059  INR 1.47     Assessment/Plan: 1 Day Post-Op Procedure(s) (LRB): CANNULATED HIP PINNING (Right)   Advance diet Up with therapy D/C IV fluids Discharge to SNF

## 2015-12-17 NOTE — Clinical Social Work Placement (Signed)
   CLINICAL SOCIAL WORK PLACEMENT  NOTE  Date:  12/17/2015  Patient Details  Name: Normand SloopRamon F Tango MRN: [id number] Date of Birth: September 27, 1916  Clinical Social Work is seeking post-discharge placement for this patient at the Skilled  Nursing Facility level of care (*CSW will initial, date and re-position this form in  chart as items are completed):  Yes   Patient/family provided with [facility] Clinical Social Work Department's list of facilities offering this level of care within the geographic area requested by the patient (or if unable, by the patient's family).  Yes   Patient/family informed of their freedom to choose among providers that offer the needed level of care, that participate in Medicare, Medicaid or managed care program needed by the patient, have an available bed and are willing to accept the patient.  Yes   Patient/family informed of [facility]'s ownership interest in [facility] and [facility], as well as of the fact that they are under no obligation to receive care at these facilities.  PASRR submitted to EDS on       PASRR number received on       Existing PASRR number confirmed on 12/16/15     FL2 transmitted to all facilities in geographic area requested by pt/family on 12/16/15     FL2 transmitted to all facilities within larger geographic area on       Patient informed that his/her managed care company has contracts with or will negotiate with certain facilities, including the following:        Yes   Patient/family informed of bed offers received.  [facility]     Physician recommends and patient chooses bed at      Patient to be transferred to   on  .  Patient to be transferred to facility by       Patient family notified on   of transfer.  Name of family member notified:        PHYSICIAN Please sign FL2     Additional Comment:    _______________________________________________ Haig ProphetMorgan, Pessy Delamar G,  LCSW 12/17/2015, 5:26 PM

## 2015-12-17 NOTE — Evaluation (Signed)
Occupational Therapy Evaluation Patient Details Name: Jacob SloopRamon F [name] MRN: [id number] DOB: Jul 09, 1917 Today's Date: 12/17/2015    History of Present Illness 82 yo M presented to ED after a fall with R hip pain found to have R femoral neck fx s/p ORIF. He recently had a CVA in January and in March. PMH includes CAD, CABG, R and L MCA.   Clinical Impression   Pt. Is a 82 y.o. Male who [facility] the Right Femoral Neck Fracture. Pt. Presents with weakness, limited act. Tolerance, visual impairments, and cognitive impairments. Pt. Could benefit from skilled OT services to improve ADL tasks, and provide family/caregiver education. Pt. Is NWBing. Pt. Requires heavy cuing for visual and cognitive status. Pt. Could benefit from skilled OT services to improve ADL and IADL functioning. Spanish Interpreter services were utilized. Waynard Reeds(Isias, and Maryjane HurterOtto were present).     Follow Up Recommendations  SNF    Equipment Recommendations  Tub/shower bench    Recommendations for Other Services PT consult     Precautions / Restrictions Precautions Precautions: Fall Restrictions Weight Bearing Restrictions: Yes RLE Weight Bearing: Touchdown weight bearing      Mobility   Balance Pt. seen while up in the chair.                            ADL Overall ADL's : Needs assistance/impaired Eating/Feeding: Independent;Set up   Grooming: Set up;Supervision/safety;Sitting       Lower Body Dressing: MaxA                         General ADL Comments: Pt. consistent frequent cues. Max verbal and visual cues     Vision     Perception     Praxis      Pertinent Vitals/Pain Pain Assessment: 0-10 Pain Score: 4  Pain Location: Right Hip Pain Descriptors / Indicators: Aching Pain Intervention(s): Limited activity within patient's tolerance     Hand Dominance Right   Extremity/Trunk Assessment Upper Extremity Assessment Upper Extremity Assessment:  Generalized weakness   Lower Extremity Assessment Lower Extremity Assessment: Generalized weakness;RLE deficits/detail RLE Deficits / Details: grossly 3/5 strength RLE: Unable to fully assess due to pain (limited testing due to WBS)       Communication Communication Communication: Prefers language other than English;Interpreter utilized;Other (comment) (spanish)   Cognition Arousal/Alertness: Awake/alert Behavior During Therapy: WFL for tasks assessed/performed Overall Cognitive Status: History of cognitive impairments - at baseline                     General Comments       Exercises Exercises: Other exercises Other Exercises Other Exercises: B LE supine heel slides, seated: LAQs, marching, hip add squeezes, hip abd with manual resistance, ankle pumps, QS and GS 2x10 each. Frequent cues for technique and staying on task.   Shoulder Instructions      Home Living Family/patient expects to be discharged to:: Skilled nursing facility Living Arrangements: Spouse/significant other;Children Available Help at Discharge: Family Type of Home: House Home Access: Stairs to enter Secretary/administratorntrance Stairs-Number of Steps: 3 Entrance Stairs-Rails: Left Home Layout: One level     Bathroom Shower/Tub: Producer, television/film/videoWalk-in shower   Bathroom Toilet: Standard Bathroom Accessibility: Yes   Home Equipment: Environmental consultantWalker - 2 wheels;Tub bench;Cane - single point          Prior Functioning/Environment Level of Independence: Independent with assistive device(s)  Comments: Use of single point cane for community ambulation    OT Diagnosis: Generalized weakness;Cognitive deficits   OT Problem List: Decreased strength;Decreased range of motion;Decreased cognition;Decreased knowledge of use of DME or AE;Decreased safety awareness;Decreased activity tolerance;Pain   OT Treatment/Interventions:      OT Goals(Current goals can be found in the care plan section) Acute Rehab OT Goals Patient Stated Goal:  none stated  OT Frequency:     Barriers to D/C:            Co-evaluation              End of Session    Activity Tolerance: Patient tolerated treatment well Patient left: in chair;with chair alarm set;with family/visitor present   Time: 6962-9528 OT Time Calculation (min): 42 min Charges:  OT General Charges $OT Visit: 1 Procedure OT Evaluation $OT Eval High Complexity: 1 Procedure OT Treatments $Self Care/Home Management : 8-22 mins G-Codes:    Olegario Messier, MS, OTR/L  Olegario Messier 12/17/2015, 12:49 PM

## 2015-12-17 NOTE — Care Management Important Message (Signed)
Important Message  Patient Details  Name: Jacob [name] MRN: [id number] Date of Birth: [birth year]   Medicare Important Message Given:  Yes    Olegario MessierKathy A Jakala Herford 12/17/2015, 11:34 AM

## 2015-12-17 NOTE — Evaluation (Addendum)
Physical Therapy Evaluation Patient Details Name: Jacob [name] MRN: [id number] DOB: 06/15/1917 Today's Date: 12/17/2015   History of Present Illness  82 yo M presented to ED after a fall with R hip pain found to have R femoral neck fx s/p ORIF. He recently had a CVA in January and in March. PMH includes CAD, CABG, R and L MCA.  Clinical Impression  Pt demonstrated generalized weakness and difficulty walking due to recent R hip ORIF with TTWB status. He requires min A for bed mobility. During transfers pt requires min A +2 mainly due to difficulty maintaining R LE TTWB. He requires constant verbal and physical cues/assistance for maintaining WBS. Ambulation deferred due to safety as pt currently has trouble following precautions due to cognition. To address deficits of strength, balance, and gait STR is recommended to progress towards PLOF and increase safety. Pt will benefit from skilled PT services to increase functional I and mobility for safe discharge.     Follow Up Recommendations SNF    Equipment Recommendations  None recommended by PT    Recommendations for Other Services       Precautions / Restrictions Precautions Precautions: Fall Restrictions Weight Bearing Restrictions: Yes RLE Weight Bearing: Touchdown weight bearing      Mobility  Bed Mobility Overal bed mobility: Needs Assistance Bed Mobility: Supine to Sit     Supine to sit: Min assist     General bed mobility comments: assistance for getting legs on EOB and trunk upright  Transfers Overall transfer level: Needs assistance Equipment used: Rolling walker (2 wheeled) Transfers: Sit to/from UGI CorporationStand;Stand Pivot Transfers Sit to Stand: Min assist;+2 physical assistance;+2 safety/equipment Stand pivot transfers: Min assist;+2 physical assistance;+2 safety/equipment       General transfer comment: Constant cues and physical assist for maintaining TTWB R LE. Pt corrects with cues but then requires additional  cues to re-correct.  Ambulation/Gait             General Gait Details: NT due to decreased ability to maintain WBS  Stairs            Wheelchair Mobility    Modified Rankin (Stroke Patients Only)       Balance Overall balance assessment: Needs assistance;History of Falls Sitting-balance support: Bilateral upper extremity supported Sitting balance-Leahy Scale: Fair     Standing balance support: Bilateral upper extremity supported Standing balance-Leahy Scale: Fair Standing balance comment: cues for maintaining R LE WBS                             Pertinent Vitals/Pain Pain Assessment: 0-10 Pain Score: 4  Pain Location: R hip Pain Descriptors / Indicators: Aching Pain Intervention(s): Limited activity within patient's tolerance;Monitored during session    Home Living Family/patient expects to be discharged to:: Skilled nursing facility Living Arrangements: Spouse/significant other;Children Available Help at Discharge: Family Type of Home: House Home Access: Stairs to enter Entrance Stairs-Rails: Left Entrance Stairs-Number of Steps: 3 Home Layout: One level Home Equipment: Walker - 2 wheels;Tub bench;Cane - single point      Prior Function Level of Independence: Independent with assistive device(s)         Comments: Use of single point cane for community ambulation     Hand Dominance   Dominant Hand: Right    Extremity/Trunk Assessment   Upper Extremity Assessment: Generalized weakness           Lower Extremity Assessment: Generalized weakness;RLE deficits/detail RLE Deficits /  Details: grossly 3/5 strength       Communication   Communication: Prefers language other than [place];Interpreter utilized;Other (comment) (spanish)  Cognition Arousal/Alertness: Awake/alert Behavior During Therapy: WFL for tasks assessed/performed Overall Cognitive Status: History of cognitive impairments - at baseline                       General Comments General comments (skin integrity, edema, etc.): R hip bandage    Exercises Other Exercises Other Exercises: B LE supine heel slides, seated: LAQs, marching, hip add squeezes, hip abd with manual resistance, ankle pumps, QS and GS 2x10 each. Frequent cues for technique and staying on task.      Assessment/Plan    PT Assessment Patient needs continued PT services  PT Diagnosis Difficulty walking;Generalized weakness   PT Problem List Decreased strength;Decreased activity tolerance;Decreased balance;Decreased mobility;Decreased cognition;Decreased knowledge of use of DME;Decreased safety awareness;Decreased knowledge of precautions  PT Treatment Interventions DME instruction;Gait training;Stair training;Therapeutic activities;Therapeutic exercise;Balance training;Neuromuscular re-education;Patient/family education   PT Goals (Current goals can be found in the Care Plan section) Acute Rehab PT Goals Patient Stated Goal: none stated PT Goal Formulation: With patient/family Time For Goal Achievement: 12/31/15 Potential to Achieve Goals: Fair    Frequency BID   Barriers to discharge Inaccessible home environment;Decreased caregiver support steps to enter, WBS, +2 physical assistance needed currently    Co-evaluation               End of Session Equipment Utilized During Treatment: Gait belt Activity Tolerance: Patient tolerated treatment well Patient left: in chair;with chair alarm set;with call bell/phone within reach;with family/visitor present;with SCD's reapplied Nurse Communication: Mobility status;Weight bearing status         Time: 1610-9604 PT Time Calculation (min) (ACUTE ONLY): 30 min   Charges:   PT Evaluation $PT Eval Moderate Complexity: 1 Procedure PT Treatments $Therapeutic Exercise: 8-22 mins   PT G Codes:        Adelene Idler, PT, DPT  12/17/2015, 9:47 AM [phone]

## 2015-12-17 NOTE — Progress Notes (Signed)
Patient ID: Jacob [name], male   DOB: [birth year], 82 y.o.   MRN: [id number]  [facility] PROGRESS NOTE  WANDA CELLUCCI WUJ:[id number] DOB: [birth year] DOA: 12/15/2015 PCP: Eustaquio Boyden, MD  HPI/Subjective: Translator at the bedside. Patient feels okay. Had a little discomfort when he tried to put some weight on his leg. Otherwise offers no complaints.  Objective: Filed Vitals:   12/17/15 0735 12/17/15 1450  BP: 120/55 154/66  Pulse: 69 93  Temp: 97.5 F (36.4 C) 99.5 F (37.5 C)  Resp: 16 16    Filed Weights   12/15/15 1005  Weight: 66.225 kg (146 lb)    ROS: Review of Systems  Constitutional: Negative for fever and chills.  Eyes: Negative for blurred vision.  Respiratory: Negative for cough and shortness of breath.   Cardiovascular: Negative for chest pain.  Gastrointestinal: Negative for nausea, vomiting, abdominal pain, diarrhea and constipation.  Genitourinary: Negative for dysuria.  Musculoskeletal: Positive for joint pain.  Neurological: Negative for dizziness and headaches.   Exam: Physical Exam  HENT:  Nose: No mucosal edema.  Mouth/Throat: No oropharyngeal exudate or posterior oropharyngeal edema.  Eyes: Conjunctivae, EOM and lids are normal. Pupils are equal, round, and reactive to light.  Neck: No JVD present. Carotid bruit is not present. No edema present. No thyroid mass and no thyromegaly present.  Cardiovascular: S1 normal and S2 normal.  An irregularly irregular rhythm present. Exam reveals no gallop.   Murmur heard.  Systolic murmur is present with a grade of 3/6  Pulses:      Dorsalis pedis pulses are 2+ on the right side, and 2+ on the left side.  Respiratory: No respiratory distress. He has no wheezes. He has no rhonchi. He has no rales.  GI: Soft. Bowel sounds are normal. There is no tenderness.  Musculoskeletal:       Right ankle: He exhibits swelling.       Left ankle: He exhibits swelling.  Lymphadenopathy:    He has no cervical  adenopathy.  Neurological: He is alert. No cranial nerve deficit.  Skin: Skin is warm. No rash noted. Nails show no clubbing.  Psychiatric: He has a normal mood and affect.      Data Reviewed: Basic Metabolic Panel:  Recent Labs Lab 12/15/15 1207 12/16/15 0632 12/17/15 0601  NA 141 138 140  K 4.0 3.6 3.7  CL 108 108 108  CO2 GLUCOSE 165* 100* 93  BUN 37* 35* 41*  CREATININE 1.63* 1.62* 1.84*  CALCIUM 9.8 8.9 8.8*   CBC:  Recent Labs Lab 12/15/15 1207 12/16/15 0632 12/17/15 0601  WBC 18.5* 11.0* 8.8  NEUTROABS 17.7*  --   --   HGB 11.2* 10.2* 9.4*  HCT 32.7* 29.3* 26.8*  MCV 94.6 95.0 94.5  PLT 210 170 154   Cardiac Enzymes:  Recent Labs Lab 12/15/15 1207  TROPONINI 0.07*    CBG:  Recent Labs Lab 12/15/15 2326  GLUCAP 132*    Recent Results (from the past 240 hour(s))  Urine culture     Status: Abnormal (Preliminary result)   Collection Time: 12/15/15 12:07 PM  Result Value Ref Range Status   Specimen Description URINE, RANDOM  Final   Special Requests NONE  Final   Culture (A)  Final    >=100,000 COLONIES/mL GRAM NEGATIVE RODS IDENTIFICATION AND SUSCEPTIBILITIES TO FOLLOW    Report Status PENDING  Incomplete  Culture, blood (routine x 2)     Status: None (Preliminary result)  Collection Time: 12/15/15  1:00 PM  Result Value Ref Range Status   Specimen Description BLOOD RIGHT ASSIST CONTROL  Final   Special Requests BOTTLES DRAWN AEROBIC AND ANAEROBIC  5CC  Final   Culture NO GROWTH 2 DAYS  Final   Report Status PENDING  Incomplete  Culture, blood (routine x 2)     Status: None (Preliminary result)   Collection Time: 12/15/15  1:05 PM  Result Value Ref Range Status   Specimen Description BLOOD LEFT ASSIST CONTROL  Final   Special Requests   Final    BOTTLES DRAWN AEROBIC AND ANAEROBIC  AERO 5CC ANA 1CC   Culture NO GROWTH 2 DAYS  Final   Report Status PENDING  Incomplete     Studies: Dg Hip Operative Unilat W Or W/o Pelvis  Right  12/16/2015  CLINICAL DATA:  Hip pinning EXAM: OPERATIVE RIGHT HIP (WITH PELVIS IF PERFORMED) 2 VIEWS TECHNIQUE: Fluoroscopic spot image(s) were submitted for interpretation post-operatively. FLUOROSCOPY TIME:  59 seconds COMPARISON:  CT right hip dated 12/15/2015 FINDINGS: Intraoperative fluoroscopic radiographs during ORIF of the right hip, with 3 cannulated cancellous screws transfixing a subcapital right hip fracture, in near anatomic alignment and position. IMPRESSION: Intraoperative fluoroscopic radiographs during ORIF of the right hip, as above. Electronically Signed   By: Charline BillsSriyesh  Krishnan M.D.   On: 12/16/2015 13:57    Scheduled Meds: . acetaminophen  1,000 mg Oral Q6H  . apixaban  2.5 mg Oral BID  . cefTRIAXone (ROCEPHIN)  IV  1 g Intravenous Q24H  . ferrous sulfate  325 mg Oral Q breakfast  . QUEtiapine  12.5 mg Oral QHS  . senna  1 tablet Oral BID    Assessment/Plan:  1. Closed her right hip fracture initial encounter requiring operative repair. Pain control as needed.Physical therapy evaluation. potentially out to rehabilitation on Friday.  2. Urine culture growing gram-negative  Rods. On Rocephin. 3. Atrial fibrillation with history of stroke. As per surgery okay to restart eliquis. Check hemoglobin tomorrow morning. 4. Hypothyroidism unspecified continue Synthroid 5. Hyperlipidemia unspecified continue Zocor 6. Leukocytosis normalized  Code Status:  Code Status History    Date Active Date Inactive Code Status Order ID Comments User Context   12/15/2015  3:47 PM 12/16/2015  3:28 PM Full Code [id number]  Houston SirenVivek J Sainani, MD Inpatient   11/20/2015  1:43 PM 11/24/2015  3:01 PM Full Code [id number]  Marcos EkeSara E Wertman, PA-C ED   09/24/2015  1:26 AM 09/24/2015  7:51 PM Full Code [id number]  Arnaldo NatalMichael S Diamond, MD Inpatient   08/01/2014 11:59 PM 08/06/2014  7:41 PM DNR [id number]  Therisa DoyneAnastassia Doutova, MD Inpatient     Family Communication: daughter at length  Disposition Plan: Rehab on  Friday or Saturday depending on how he is tomorrow  Consultants:  Orthopedic surgery  Antibiotics:  Rocephin  Time spent: 24 minutes, hospital translator at the bedside when I came.  Alford HighlandWIETING, Hellen Shanley  [employer]

## 2015-12-17 NOTE — Progress Notes (Signed)
Physical Therapy Treatment Patient Details Name: Normand SloopRamon F Varelas MRN: [id number] DOB: [birth year] Today's Date: 12/17/2015    History of Present Illness 82 yo M presented to ED after a fall with R hip pain found to have R femoral neck fx s/p ORIF. He recently had a CVA in January and in March. PMH includes CAD, CABG, R and L MCA.    PT Comments    Pt required moderate motivation to participate in any therapy this session due to fatigue. He agreed to bed exercises. AAROM for R LE exercises. Pt continues to have a difficult time following directions and staying on task. Family and interpreter present during session.   Follow Up Recommendations  SNF     Equipment Recommendations  None recommended by PT    Recommendations for Other Services       Precautions / Restrictions Precautions Precautions: Fall Restrictions Weight Bearing Restrictions: Yes RLE Weight Bearing: Touchdown weight bearing    Mobility  Bed Mobility               General bed mobility comments: Pt declined to participate in OOB activity at this time.  Transfers                    Ambulation/Gait                 Stairs            Wheelchair Mobility    Modified Rankin (Stroke Patients Only)       Balance                                    Cognition Arousal/Alertness: Awake/alert Behavior During Therapy: WFL for tasks assessed/performed Overall Cognitive Status: History of cognitive impairments - at baseline                      Exercises Other Exercises Other Exercises: B LE supine heel slides, hip abd slides, QS and ankle pumps x15 each AAROM each. Frequent cues for technique and staying on task.    General Comments        Pertinent Vitals/Pain Pain Assessment: 0-10 Pain Score: 4  Pain Location: reports none at rest, but experiences pain with movement/unable to rate Pain Descriptors / Indicators: Aching Pain Intervention(s): Limited  activity within patient's tolerance;Monitored during session;Repositioned    Home Living Family/patient expects to be discharged to:: Skilled nursing facility Living Arrangements: Spouse/significant other;Children Available Help at Discharge: Family Type of Home: House Home Access: Stairs to enter Entrance Stairs-Rails: Left Home Layout: One level Home Equipment: Environmental consultantWalker - 2 wheels;Tub bench;Cane - single point      Prior Function Level of Independence: Independent with assistive device(s)          PT Goals (current goals can now be found in the care plan section) Acute Rehab PT Goals Patient Stated Goal: none stated PT Goal Formulation: With patient/family Time For Goal Achievement: 12/31/15 Potential to Achieve Goals: Fair Progress towards PT goals: Progressing toward goals    Frequency  BID    PT Plan Current plan remains appropriate    Co-evaluation             End of Session           Time: 1410-1425 PT Time Calculation (min) (ACUTE ONLY): 15 min  Charges:  $Therapeutic Exercise: 8-22 mins  G Codes:      Adelene Idler, PT, DPT  12/17/2015, 2:50 PM [phone]

## 2015-12-18 ENCOUNTER — Inpatient Hospital Stay: Payer: PPO

## 2015-12-18 DIAGNOSIS — S72001D Fracture of unspecified part of neck of right femur, subsequent encounter for closed fracture with routine healing: Secondary | ICD-10-CM | POA: Diagnosis not present

## 2015-12-18 DIAGNOSIS — R488 Other symbolic dysfunctions: Secondary | ICD-10-CM | POA: Diagnosis not present

## 2015-12-18 DIAGNOSIS — H269 Unspecified cataract: Secondary | ICD-10-CM | POA: Diagnosis not present

## 2015-12-18 DIAGNOSIS — S72011D Unspecified intracapsular fracture of right femur, subsequent encounter for closed fracture with routine healing: Secondary | ICD-10-CM | POA: Diagnosis not present

## 2015-12-18 DIAGNOSIS — I4891 Unspecified atrial fibrillation: Secondary | ICD-10-CM | POA: Diagnosis not present

## 2015-12-18 DIAGNOSIS — R262 Difficulty in walking, not elsewhere classified: Secondary | ICD-10-CM | POA: Diagnosis not present

## 2015-12-18 DIAGNOSIS — F419 Anxiety disorder, unspecified: Secondary | ICD-10-CM | POA: Diagnosis not present

## 2015-12-18 DIAGNOSIS — I2510 Atherosclerotic heart disease of native coronary artery without angina pectoris: Secondary | ICD-10-CM | POA: Diagnosis not present

## 2015-12-18 DIAGNOSIS — J189 Pneumonia, unspecified organism: Secondary | ICD-10-CM | POA: Diagnosis not present

## 2015-12-18 DIAGNOSIS — E039 Hypothyroidism, unspecified: Secondary | ICD-10-CM | POA: Diagnosis not present

## 2015-12-18 DIAGNOSIS — S72091A Other fracture of head and neck of right femur, initial encounter for closed fracture: Secondary | ICD-10-CM | POA: Diagnosis not present

## 2015-12-18 DIAGNOSIS — K5900 Constipation, unspecified: Secondary | ICD-10-CM | POA: Diagnosis not present

## 2015-12-18 DIAGNOSIS — E785 Hyperlipidemia, unspecified: Secondary | ICD-10-CM | POA: Diagnosis not present

## 2015-12-18 DIAGNOSIS — M6281 Muscle weakness (generalized): Secondary | ICD-10-CM | POA: Diagnosis not present

## 2015-12-18 DIAGNOSIS — N3001 Acute cystitis with hematuria: Secondary | ICD-10-CM | POA: Diagnosis not present

## 2015-12-18 DIAGNOSIS — H409 Unspecified glaucoma: Secondary | ICD-10-CM | POA: Diagnosis not present

## 2015-12-18 DIAGNOSIS — G4700 Insomnia, unspecified: Secondary | ICD-10-CM | POA: Diagnosis not present

## 2015-12-18 DIAGNOSIS — N390 Urinary tract infection, site not specified: Secondary | ICD-10-CM | POA: Diagnosis not present

## 2015-12-18 DIAGNOSIS — M25559 Pain in unspecified hip: Secondary | ICD-10-CM | POA: Diagnosis not present

## 2015-12-18 DIAGNOSIS — I10 Essential (primary) hypertension: Secondary | ICD-10-CM | POA: Diagnosis not present

## 2015-12-18 DIAGNOSIS — S72001A Fracture of unspecified part of neck of right femur, initial encounter for closed fracture: Secondary | ICD-10-CM | POA: Diagnosis not present

## 2015-12-18 LAB — CBC
HCT: 27.5 % — ABNORMAL LOW (ref 40.0–52.0)
HEMOGLOBIN: 9.4 g/dL — AB (ref 13.0–18.0)
MCH: 32.3 pg (ref 26.0–34.0)
MCHC: 34.2 g/dL (ref 32.0–36.0)
MCV: 94.4 fL (ref 80.0–100.0)
PLATELETS: 163 10*3/uL (ref 150–440)
RBC: 2.92 MIL/uL — AB (ref 4.40–5.90)
RDW: 14.3 % (ref 11.5–14.5)
WBC: 8.6 10*3/uL (ref 3.8–10.6)

## 2015-12-18 LAB — URINE CULTURE: Culture: >=100000 — AB

## 2015-12-18 LAB — BASIC METABOLIC PANEL
ANION GAP: 7 (ref 5–15)
BUN: 43 mg/dL — ABNORMAL HIGH (ref 6–20)
CHLORIDE: 109 mmol/L (ref 101–111)
CO2: 26 mmol/L (ref 22–32)
Calcium: 8.8 mg/dL — ABNORMAL LOW (ref 8.9–10.3)
Creatinine, Ser: 1.68 mg/dL — ABNORMAL HIGH (ref 0.61–1.24)
GFR calc Af Amer: 38 mL/min — ABNORMAL LOW (ref >60)
GFR, EST NON AFRICAN AMERICAN: 32 mL/min — AB (ref >60)
Glucose, Bld: 105 mg/dL — ABNORMAL HIGH (ref 65–99)
POTASSIUM: 3.8 mmol/L (ref 3.5–5.1)
SODIUM: 142 mmol/L (ref 135–145)

## 2015-12-18 MED ORDER — SULFAMETHOXAZOLE-TRIMETHOPRIM 800-160 MG PO TABS: ORAL_TABLET | ORAL | Status: DC

## 2015-12-18 MED ORDER — ACETAMINOPHEN 325 MG PO TABS: 650.0000 mg | ORAL_TABLET | Freq: Four times a day (QID) | ORAL | Status: DC | PRN

## 2015-12-18 MED ORDER — FERROUS SULFATE 325 (65 FE) MG PO TABS: 325.0000 mg | ORAL_TABLET | Freq: Every day | ORAL | Status: DC

## 2015-12-18 MED ORDER — QUETIAPINE FUMARATE 25 MG PO TABS: 12.5000 mg | ORAL_TABLET | Freq: Every day | ORAL | Status: DC

## 2015-12-18 MED ORDER — MORPHINE SULFATE (PF) 2 MG/ML IV SOLN
2.0000 mg | Freq: Once | INTRAVENOUS | Status: AC
  Administered 2015-12-18: 2 mg via INTRAVENOUS
  Filled 2015-12-18: qty 1

## 2015-12-18 MED ORDER — HYDROCODONE-ACETAMINOPHEN 5-325 MG PO TABS: 1.0000 | ORAL_TABLET | ORAL | Status: DC | PRN

## 2015-12-18 MED ORDER — SULFAMETHOXAZOLE-TRIMETHOPRIM 800-160 MG PO TABS: 1.0000 | ORAL_TABLET | Freq: Every day | ORAL | Status: DC

## 2015-12-18 MED ORDER — SENNA 8.6 MG PO TABS: 1.0000 | ORAL_TABLET | Freq: Two times a day (BID) | ORAL | Status: DC

## 2015-12-18 NOTE — Discharge Summary (Signed)
[facility] - [place] at [facility]   PATIENT NAME: Jacob [name]    MR#:  [id number]  DATE OF BIRTH:  06-Oct-1916  DATE OF ADMISSION:  12/15/2015 ADMITTING PHYSICIAN: Houston SirenVivek J Sainani, MD  DATE OF DISCHARGE: 12/18/2015  PRIMARY CARE PHYSICIAN: Eustaquio BoydenJavier Gutierrez, MD    ADMISSION DIAGNOSIS:  Femoral neck fracture, right, closed, initial encounter [S72.001A] Urinary tract infection without hematuria, site unspecified [N39.0] Subcapital fracture of right femur (HCC) [S72.011A]  DISCHARGE DIAGNOSIS:  Active Problems:   Hip fracture (HCC)   SECONDARY DIAGNOSIS:   Past Medical History  Diagnosis Date  . CAD in native artery -->  1994    referred for CABG  . S/P CABG x 4 1994    a) 1994: LIMA-LAD, SVG-RI, SVG-OM, SVG-RCA; b) 09/2006, Persantine Myoview: fixed inferior defect consistent with infarct/scar without peri-infarct ischemia;; 2-D echo: Mild-mod concentric LVH. Normal EF.  Abnormal relaxation, aortic sclerosis with mild-mod AI  . Chronic atrial fibrillation (HCC) 12/15/2012  . Long term (current) use of anticoagulants 12/15/2012    warfarin  . Essential hypertension 05/23/2013  . Dyslipidemia, goal LDL below 70[272.4]      - on statin, followed by primary physician.   . Glaucoma   . History of kidney stones   . H/O seasonal allergies   . Diverticulitis   . C4 cervical fracture (HCC) 2015    with spinal cord contusion - stopped coumadin.  . Acute right MCA stroke (HCC) 09/23/2015  . Acute ischemic left MCA stroke (HCC) 10/2015    HOSPITAL COURSE:   1. Closed right hip fracture initial encounter requiring operative repair. This morning he had severe pain requiring an IV medication. Pain is better controlled at this point. Continue oral pain medications. Out to rehabilitation today. 2. Urine culture growing Escherichia coli resistant to the Rocephin that he is on. Switched to Bactrim. I will give a dose today and then 4 more days upon discharge the  rehabilitation. Then can stop this medication.  3. Atrial fibrillation with History of stroke. Patient on eliquis 4. Hypothyroidism unspecified on Synthroid 5. Hyperlipidemia unspecified on Zocor 6. Leukocytosis normalized 7. Check BMP and hemoglobin on Monday 8. Blood loss anemia. Hemoglobin stable. Iron once a day 9. Chronic kidney disease stage III stable 10. Elevated troponin demand ischemia from fall and pain 11. Seroquel started to help with sleep.   DISCHARGE CONDITIONS:   Satisfactory  CONSULTS OBTAINED:  Treatment Team:  Deeann SaintHoward Miller, MD  DRUG ALLERGIES:  No Known Allergies  DISCHARGE MEDICATIONS:   Current Discharge Medication List    START taking these medications   Details  acetaminophen (TYLENOL) 325 MG tablet Take 2 tablets (650 mg total) by mouth every 6 (six) hours as needed for mild pain (or Fever >/= 101).    ferrous sulfate 325 (65 FE) MG tablet Take 1 tablet (325 mg total) by mouth daily with breakfast. Qty: 30 tablet, Refills: 0    HYDROcodone-acetaminophen (NORCO/VICODIN) 5-325 MG tablet Take 1 tablet by mouth every 4 (four) hours as needed for moderate pain or severe pain. Qty: 30 tablet, Refills: 0    QUEtiapine (SEROQUEL) 25 MG tablet Take 0.5 tablets (12.5 mg total) by mouth at bedtime. Qty: 30 tablet, Refills: 0    senna (SENOKOT) 8.6 MG TABS tablet Take 1 tablet (8.6 mg total) by mouth 2 (two) times daily. Qty: 120 each, Refills: 0    sulfamethoxazole-trimethoprim (BACTRIM DS,SEPTRA DS) 800-160 MG tablet For four days then stop Qty: 4 tablet, Refills: 0  CONTINUE these medications which have NOT CHANGED   Details  apixaban (ELIQUIS) 2.5 MG TABS tablet Take 1 tablet (2.5 mg total) by mouth 2 (two) times daily. Qty: 60 tablet, Refills: 2    latanoprost (XALATAN) 0.005 % ophthalmic solution Place 1 drop into both eyes at bedtime.    levothyroxine (SYNTHROID, LEVOTHROID) 88 MCG tablet Take 1 tablet (88 mcg total) by mouth daily before  breakfast. Qty: 30 tablet, Refills: 6    simvastatin (ZOCOR) 20 MG tablet Take 20 mg by mouth at bedtime.      STOP taking these medications     carvedilol (COREG) 3.125 MG tablet          DISCHARGE INSTRUCTIONS:   Follow-up Dr. rehabilitation 1 day  If you experience worsening of your admission symptoms, develop shortness of breath, life threatening emergency, suicidal or homicidal thoughts you must seek medical attention immediately by calling 911 or calling your MD immediately  if symptoms less severe.  You Must read complete instructions/literature along with all the possible adverse reactions/side effects for all the Medicines you take and that have been prescribed to you. Take any new Medicines after you have completely understood and accept all the possible adverse reactions/side effects.   Please note  You were cared for by a hospitalist during your hospital stay. If you have any questions about your discharge medications or the care you received while you were in the hospital after you are discharged, you can call the unit and asked to speak with the hospitalist on call if the hospitalist that took care of you is not available. Once you are discharged, your primary care physician will handle any further medical issues. Please note that NO REFILLS for any discharge medications will be authorized once you are discharged, as it is imperative that you return to your primary care physician (or establish a relationship with a primary care physician if you do not have one) for your aftercare needs so that they can reassess your need for medications and monitor your lab values.    Today   CHIEF COMPLAINT:   Chief Complaint  Patient presents with  . Fall    HISTORY OF PRESENT ILLNESS:  Jacob [name]  is a 82 y.o. male presented after a fall and found to have a right hip fracture   VITAL SIGNS:  Blood pressure 132/65, pulse 83, temperature 98 F (36.7 C), temperature source  Oral, resp. rate 18, height  (1.702 m), weight 66.225 kg (146 lb), SpO2 98 %.    PHYSICAL EXAMINATION:  GENERAL:  82 y.o.-year-old patient lying in the bed with no acute distress.  EYES: Pupils equal, round, reactive to light and accommodation. No scleral icterus. Extraocular muscles intact.  HEENT: Head atraumatic, normocephalic. Oropharynx and nasopharynx clear.  NECK:  Supple, no jugular venous distention. No thyroid enlargement, no tenderness.  LUNGS: Normal breath sounds bilaterally, no wheezing, rales,rhonchi or crepitation. No use of accessory muscles of respiration.  CARDIOVASCULAR: S1, S2 normal. No murmurs, rubs, or gallops.  ABDOMEN: Soft, non-tender, non-distended. Bowel sounds present. No organomegaly or mass.  EXTREMITIES: No pedal edema, cyanosis, or clubbing.  NEUROLOGIC: Cranial nerves II through XII are intact. This morning patient unable to lift the right leg up off the bed. Sensation intact. Gait not checked.  PSYCHIATRIC: The patient is alert.  SKIN: No obvious rash, lesion, or ulcer.   DATA REVIEW:   CBC  Recent Labs Lab 12/18/15 0420  WBC 8.6  HGB 9.4*  HCT  27.5*  PLT 163    Chemistries   Recent Labs Lab 12/18/15 0420  NA 142  K 3.8  CL 109  CO2 26  GLUCOSE 105*  BUN 43*  CREATININE 1.68*  CALCIUM 8.8*    Cardiac Enzymes  Recent Labs Lab 12/15/15 1207  TROPONINI 0.07*    Microbiology Results  Results for orders placed or performed during the hospital encounter of 12/15/15  Urine culture     Status: Abnormal (Preliminary result)   Collection Time: 12/15/15 12:07 PM  Result Value Ref Range Status   Specimen Description URINE, RANDOM  Final   Special Requests NONE  Final   Culture (A)  Final    >=100,000 COLONIES/mL GRAM NEGATIVE RODS IDENTIFICATION AND SUSCEPTIBILITIES TO FOLLOW    Report Status PENDING  Incomplete  Culture, blood (routine x 2)     Status: None (Preliminary result)   Collection Time: 12/15/15  1:00 PM   Result Value Ref Range Status   Specimen Description BLOOD RIGHT ASSIST CONTROL  Final   Special Requests BOTTLES DRAWN AEROBIC AND ANAEROBIC  5CC  Final   Culture NO GROWTH 3 DAYS  Final   Report Status PENDING  Incomplete  Culture, blood (routine x 2)     Status: None (Preliminary result)   Collection Time: 12/15/15  1:05 PM  Result Value Ref Range Status   Specimen Description BLOOD LEFT ASSIST CONTROL  Final   Special Requests   Final    BOTTLES DRAWN AEROBIC AND ANAEROBIC  AERO 5CC ANA 1CC   Culture NO GROWTH 3 DAYS  Final   Report Status PENDING  Incomplete    RADIOLOGY:  Dg Hip Operative Unilat W Or W/o Pelvis Right  12/16/2015  CLINICAL DATA:  Hip pinning EXAM: OPERATIVE RIGHT HIP (WITH PELVIS IF PERFORMED) 2 VIEWS TECHNIQUE: Fluoroscopic spot image(s) were submitted for interpretation post-operatively. FLUOROSCOPY TIME:  59 seconds COMPARISON:  CT right hip dated 12/15/2015 FINDINGS: Intraoperative fluoroscopic radiographs during ORIF of the right hip, with 3 cannulated cancellous screws transfixing a subcapital right hip fracture, in near anatomic alignment and position. IMPRESSION: Intraoperative fluoroscopic radiographs during ORIF of the right hip, as above. Electronically Signed   By: Charline Bills M.D.   On: 12/16/2015 13:57   Dg Hip Unilat With Pelvis 2-3 Views Right  12/18/2015  CLINICAL DATA:  Evaluation of hardware placement of right hip post-op several days ago with continued pain at right hip. EXAM: DG HIP (WITH OR WITHOUT PELVIS) 2-3V RIGHT COMPARISON:  12/15/2015 FINDINGS: Interval placement of 4 orthopedic pins across the previously described right femoral neck fracture. Mild impaction of fracture fragments. No dislocation. No acute fracture. Mild diffuse osteopenia. Bilateral pelvic calcifications. Left femoral fixation hardware and fracture deformity of the proximal femur is partially seen, appears stable. IMPRESSION: 1. Internal fixation of right femoral neck  fracture without apparent complication. Electronically Signed   By: Corlis Leak M.D.   On: 12/18/2015 11:21     Management plans discussed with the patient (through interpreter), family and they are in agreement.  CODE STATUS:  Code Status History    Date Active Date Inactive Code Status Order ID Comments User Context   12/15/2015  3:47 PM 12/16/2015  3:28 PM Full Code [id number]  Houston Siren, MD Inpatient   11/20/2015  1:43 PM 11/24/2015  3:01 PM Full Code [id number]  Marcos Eke, PA-C ED   09/24/2015  1:26 AM 09/24/2015  7:51 PM Full Code [id number]  Arnaldo Natal, MD Inpatient  08/01/2014 11:59 PM 08/06/2014  7:41 PM DNR [id number]  Therisa Doyne, MD Inpatient      TOTAL TIME TAKING CARE OF THIS PATIENT: 35 minutes.    Alford Highland M.D on 12/18/2015 at 12:32 PM  Between 7am to 6pm - Pager - [phone]  After 6pm go to [email or web address] - password [email or web address]  [facility] Office  [phone]  CC: Primary care physician; Eustaquio Boyden, MD

## 2015-12-18 NOTE — Anesthesia Postprocedure Evaluation (Signed)
Anesthesia Post Note  Patient: Normand SloopRamon F Boultinghouse  Procedure(s) Performed: Procedure(s) (LRB): CANNULATED HIP PINNING (Right)  Patient location during evaluation: PACU Anesthesia Type: General Level of consciousness: awake and alert Pain management: pain level controlled Vital Signs Assessment: post-procedure vital signs reviewed and stable Respiratory status: spontaneous breathing, nonlabored ventilation, respiratory function stable and patient connected to nasal cannula oxygen Cardiovascular status: blood pressure returned to baseline and stable Postop Assessment: no signs of nausea or vomiting Anesthetic complications: no    Last Vitals:  Filed Vitals:   12/17/15 1934 12/18/15 0418  BP: 157/81 137/74  Pulse: 89 79  Temp: 36.9 C 36.7 C  Resp: 18 18    Last Pain:  Filed Vitals:   12/18/15 0436  PainSc: Asleep                 Lenard SimmerAndrew Masen Salvas

## 2015-12-18 NOTE — Progress Notes (Signed)
Subjective: 2 Days Post-Op Procedure(s) (LRB): CANNULATED HIP PINNING (Right)    Patient reports pain as mild.  Objective:   VITALS:   Filed Vitals:   12/18/15 0418 12/18/15 0820  BP: 137/74 132/65  Pulse: 79 83  Temp: 98.1 F (36.7 C) 98 F (36.7 C)  Resp: 18 18    Neurologically intact ABD soft Neurovascular intact Sensation intact distally Intact pulses distally Dorsiflexion/Plantar flexion intact dressing dry.  rom of hip good with mild pain.  Xrays of right hip today show no change from surgery. Good position of all screws  LABS  Recent Labs  12/16/15 0632 12/17/15 0601 12/18/15 0420  HGB 10.2* 9.4* 9.4*  HCT 29.3* 26.8* 27.5*  WBC 11.0* 8.8 8.6  PLT 170 154 163     Recent Labs  12/16/15 0632 12/17/15 0601 12/18/15 0420  NA 138 140 142  K 3.6 3.7 3.8  BUN 35* 41* 43*  CREATININE 1.62* 1.84* 1.68*  GLUCOSE 100* 93 105*     Recent Labs  12/15/15 2059  INR 1.47     Assessment/Plan: 2 Days Post-Op Procedure(s) (LRB): CANNULATED HIP PINNING (Right)   Advance diet Up with therapy Discharge to SNF

## 2015-12-18 NOTE — Progress Notes (Signed)
Patient is medically stable for D/C to [facility] today. [facility] will go to a private room today. RN will call report and arrange EMS for transport. Health Team authorization has been received. Auth # [id number]. Clinical Child psychotherapistocial Worker (CSW) sent D/C Summary, FL2 and D/C Packet to [facility] via [employer]. Patient's daughter Kathie RhodesBetty is at bedside and aware of above. Please reconsult if future social work needs arise. CSW signing off.   Jetta LoutBailey Morgan, LCSW [phone]

## 2015-12-18 NOTE — Progress Notes (Addendum)
Called report to Summer at [facility]. Patient's belongings packed, IV removed & patient made ready for transport by nurse tech. EMS called.

## 2015-12-18 NOTE — Consult Note (Signed)
   [facility]   12/18/2015  [name] 1917/04/04 [id number]   Patient screened for potential [facility] Care Management services. Patient is eligible for [facility] Care [facility] Services. Epic reveals patient's discharge plan is SNF. [facility] Care [facility] services not appropriate at this time. If patient's post hospital needs change please place a [facility] Care Management consult. For questions please contact:   Cassady Stanczak RN, BSN [facility]  Hospital Liaison  [phone]) Business Mobile [phone]) Toll free office

## 2015-12-18 NOTE — Clinical Social Work Placement (Signed)
   CLINICAL SOCIAL WORK PLACEMENT  NOTE  Date:  12/18/2015  Patient Details  Name: Jacob [name] MRN: [id number] Date of Birth: October 14, 1916  Clinical Social Work is seeking post-discharge placement for this patient at the Skilled  Nursing Facility level of care (*CSW will initial, date and re-position this form in  chart as items are completed):  Yes   Patient/family provided with [facility] Clinical Social Work Department's list of facilities offering this level of care within the geographic area requested by the patient (or if unable, by the patient's family).  Yes   Patient/family informed of their freedom to choose among providers that offer the needed level of care, that participate in Medicare, Medicaid or managed care program needed by the patient, have an available bed and are willing to accept the patient.  Yes   Patient/family informed of [facility]'s ownership interest in [facility] and [facility], as well as of the fact that they are under no obligation to receive care at these facilities.  PASRR submitted to EDS on       PASRR number received on       Existing PASRR number confirmed on 12/16/15     FL2 transmitted to all facilities in geographic area requested by pt/family on 12/16/15     FL2 transmitted to all facilities within larger geographic area on       Patient informed that his/her managed care company has contracts with or will negotiate with certain facilities, including the following:        Yes   Patient/family informed of bed offers received.  Patient chooses bed at  [facility])     Physician recommends and patient chooses bed at      Patient to be transferred to  [facility] ) on 12/18/15.  Patient to be transferred to facility by  [facility] )     Patient family notified on 12/18/15 of transfer.  Name of family member notified:   (Patient's daughter is at bedside and aware of D/C today. )     PHYSICIAN      Additional Comment:    _______________________________________________ Haig ProphetMorgan, Breely Panik G, LCSW 12/18/2015, 2:15 PM

## 2015-12-18 NOTE — Progress Notes (Signed)
Physical Therapy Treatment Patient Details Name: Jacob [name] MRN: [id number] DOB: [birth year] Today's Date: 12/18/2015    History of Present Illness 82 yo M presented to ED after a fall with R hip pain found to have R femoral neck fx s/p ORIF. He recently had a CVA in January and in March. PMH includes CAD, CABG, R and L MCA.    PT Comments    PT awake and ready for session.  Interpreter used.  Pt reported 10/10 pain prior to medication but now 4/10.  To edge of bed with mod a x 2.  Sitting without loss of balance.  Stood with min a x 2 with assist to maintain toe touch weight bearing right leg.  He was able to maintain well during transfer to chair with verbal and tactile cues.  Participated in seated A/AAROM x 10.  Pt was pleased with his progress this morning.    Follow Up Recommendations  SNF     Equipment Recommendations  None recommended by PT    Recommendations for Other Services       Precautions / Restrictions Restrictions Weight Bearing Restrictions: Yes RLE Weight Bearing: Touchdown weight bearing    Mobility  Bed Mobility Overal bed mobility: +2 for physical assistance Bed Mobility: Supine to Sit     Supine to sit: +2 for physical assistance;Mod assist        Transfers Overall transfer level: Needs assistance Equipment used: Rolling walker (2 wheeled) Transfers: Sit to/from Stand Sit to Stand: Min assist;+2 physical assistance;+2 safety/equipment Stand pivot transfers: Min assist;+2 physical assistance;+2 safety/equipment       General transfer comment: did well  Ambulation/Gait Ambulation/Gait assistance: +2 physical assistance;Min assist Ambulation Distance (Feet): 2 Feet Assistive device: Rolling walker (2 wheeled)           Stairs            Wheelchair Mobility    Modified [facility] (Stroke Patients Only)       Balance   Sitting-balance support: Feet supported Sitting balance-Leahy Scale: Fair     Standing balance  support: Bilateral upper extremity supported Standing balance-Leahy Scale: Poor                      Cognition Arousal/Alertness: Awake/alert Behavior During Therapy: WFL for tasks assessed/performed Overall Cognitive Status: History of cognitive impairments - at baseline                      Exercises General Exercises - Lower Extremity Ankle Circles/Pumps: AAROM;10 reps;Right;Seated Long Arc Quad: AAROM;Right;10 reps;Seated Hip ABduction/ADduction: AROM;Both;10 reps;Seated Hip Flexion/Marching: AAROM;Both;10 reps;Seated Toe Raises: AROM;Both;10 reps;Seated    General Comments        Pertinent Vitals/Pain Pain Assessment: 0-10 Pain Score: 4  Pain Location: right hip Pain Descriptors / Indicators: Aching Pain Intervention(s): Premedicated before session    Home Living                      Prior Function            PT Goals (current goals can now be found in the care plan section) Acute Rehab PT Goals Patient Stated Goal: none stated Progress towards PT goals: Progressing toward goals    Frequency  BID    PT Plan Current plan remains appropriate    Co-evaluation             End of Session  Time: [id number] PT Time Calculation (min) (ACUTE ONLY): 21 min  Charges:  $Therapeutic Activity: 8-22 mins                    G Codes:      Danielle DessSarah Socorro Ebron, PTA 12/18/2015, 9:06 AM

## 2015-12-20 LAB — CULTURE, BLOOD (ROUTINE X 2)
CULTURE: NO GROWTH
Culture: NO GROWTH

## 2015-12-25 ENCOUNTER — Ambulatory Visit: Payer: PPO | Admitting: Family Medicine

## 2015-12-28 ENCOUNTER — Ambulatory Visit: Payer: PPO | Admitting: Cardiology

## 2015-12-28 DIAGNOSIS — K5900 Constipation, unspecified: Secondary | ICD-10-CM | POA: Diagnosis not present

## 2015-12-28 DIAGNOSIS — H409 Unspecified glaucoma: Secondary | ICD-10-CM | POA: Diagnosis not present

## 2015-12-28 DIAGNOSIS — E785 Hyperlipidemia, unspecified: Secondary | ICD-10-CM | POA: Diagnosis not present

## 2015-12-28 DIAGNOSIS — F419 Anxiety disorder, unspecified: Secondary | ICD-10-CM | POA: Diagnosis not present

## 2015-12-28 DIAGNOSIS — I2510 Atherosclerotic heart disease of native coronary artery without angina pectoris: Secondary | ICD-10-CM | POA: Diagnosis not present

## 2015-12-28 DIAGNOSIS — N390 Urinary tract infection, site not specified: Secondary | ICD-10-CM | POA: Diagnosis not present

## 2015-12-28 DIAGNOSIS — S72001D Fracture of unspecified part of neck of right femur, subsequent encounter for closed fracture with routine healing: Secondary | ICD-10-CM | POA: Diagnosis not present

## 2015-12-28 DIAGNOSIS — R488 Other symbolic dysfunctions: Secondary | ICD-10-CM | POA: Diagnosis not present

## 2015-12-28 DIAGNOSIS — I10 Essential (primary) hypertension: Secondary | ICD-10-CM | POA: Diagnosis not present

## 2015-12-28 DIAGNOSIS — H269 Unspecified cataract: Secondary | ICD-10-CM | POA: Diagnosis not present

## 2015-12-28 DIAGNOSIS — J189 Pneumonia, unspecified organism: Secondary | ICD-10-CM | POA: Diagnosis not present

## 2015-12-28 DIAGNOSIS — S72011D Unspecified intracapsular fracture of right femur, subsequent encounter for closed fracture with routine healing: Secondary | ICD-10-CM | POA: Diagnosis not present

## 2015-12-28 DIAGNOSIS — M6281 Muscle weakness (generalized): Secondary | ICD-10-CM | POA: Diagnosis not present

## 2015-12-28 DIAGNOSIS — E039 Hypothyroidism, unspecified: Secondary | ICD-10-CM | POA: Diagnosis not present

## 2015-12-28 DIAGNOSIS — G4700 Insomnia, unspecified: Secondary | ICD-10-CM | POA: Diagnosis not present

## 2015-12-28 DIAGNOSIS — R262 Difficulty in walking, not elsewhere classified: Secondary | ICD-10-CM | POA: Diagnosis not present

## 2015-12-28 DIAGNOSIS — I4891 Unspecified atrial fibrillation: Secondary | ICD-10-CM | POA: Diagnosis not present

## 2016-01-01 ENCOUNTER — Ambulatory Visit: Payer: PPO | Admitting: Family Medicine

## 2016-01-26 ENCOUNTER — Ambulatory Visit: Payer: PPO | Admitting: Cardiology

## 2016-01-27 DIAGNOSIS — S72001D Fracture of unspecified part of neck of right femur, subsequent encounter for closed fracture with routine healing: Secondary | ICD-10-CM | POA: Diagnosis not present

## 2016-01-28 DIAGNOSIS — I4891 Unspecified atrial fibrillation: Secondary | ICD-10-CM | POA: Diagnosis not present

## 2016-01-28 DIAGNOSIS — R488 Other symbolic dysfunctions: Secondary | ICD-10-CM | POA: Diagnosis not present

## 2016-01-28 DIAGNOSIS — E039 Hypothyroidism, unspecified: Secondary | ICD-10-CM | POA: Diagnosis not present

## 2016-01-28 DIAGNOSIS — I10 Essential (primary) hypertension: Secondary | ICD-10-CM | POA: Diagnosis not present

## 2016-01-28 DIAGNOSIS — F419 Anxiety disorder, unspecified: Secondary | ICD-10-CM | POA: Diagnosis not present

## 2016-01-28 DIAGNOSIS — S72011D Unspecified intracapsular fracture of right femur, subsequent encounter for closed fracture with routine healing: Secondary | ICD-10-CM | POA: Diagnosis not present

## 2016-01-28 DIAGNOSIS — H269 Unspecified cataract: Secondary | ICD-10-CM | POA: Diagnosis not present

## 2016-01-28 DIAGNOSIS — E785 Hyperlipidemia, unspecified: Secondary | ICD-10-CM | POA: Diagnosis not present

## 2016-01-28 DIAGNOSIS — M6281 Muscle weakness (generalized): Secondary | ICD-10-CM | POA: Diagnosis not present

## 2016-01-28 DIAGNOSIS — S72001D Fracture of unspecified part of neck of right femur, subsequent encounter for closed fracture with routine healing: Secondary | ICD-10-CM | POA: Diagnosis not present

## 2016-01-28 DIAGNOSIS — G4700 Insomnia, unspecified: Secondary | ICD-10-CM | POA: Diagnosis not present

## 2016-01-28 DIAGNOSIS — H409 Unspecified glaucoma: Secondary | ICD-10-CM | POA: Diagnosis not present

## 2016-01-28 DIAGNOSIS — N390 Urinary tract infection, site not specified: Secondary | ICD-10-CM | POA: Diagnosis not present

## 2016-01-28 DIAGNOSIS — J189 Pneumonia, unspecified organism: Secondary | ICD-10-CM | POA: Diagnosis not present

## 2016-01-28 DIAGNOSIS — K5900 Constipation, unspecified: Secondary | ICD-10-CM | POA: Diagnosis not present

## 2016-01-28 DIAGNOSIS — I2510 Atherosclerotic heart disease of native coronary artery without angina pectoris: Secondary | ICD-10-CM | POA: Diagnosis not present

## 2016-01-28 DIAGNOSIS — R262 Difficulty in walking, not elsewhere classified: Secondary | ICD-10-CM | POA: Diagnosis not present

## 2016-02-03 DIAGNOSIS — S72001D Fracture of unspecified part of neck of right femur, subsequent encounter for closed fracture with routine healing: Secondary | ICD-10-CM | POA: Diagnosis not present

## 2016-02-03 DIAGNOSIS — E785 Hyperlipidemia, unspecified: Secondary | ICD-10-CM | POA: Diagnosis not present

## 2016-02-03 DIAGNOSIS — I4891 Unspecified atrial fibrillation: Secondary | ICD-10-CM | POA: Diagnosis not present

## 2016-02-03 DIAGNOSIS — I10 Essential (primary) hypertension: Secondary | ICD-10-CM | POA: Diagnosis not present

## 2016-02-03 DIAGNOSIS — I2510 Atherosclerotic heart disease of native coronary artery without angina pectoris: Secondary | ICD-10-CM | POA: Diagnosis not present

## 2016-03-14 DIAGNOSIS — S72001D Fracture of unspecified part of neck of right femur, subsequent encounter for closed fracture with routine healing: Secondary | ICD-10-CM | POA: Diagnosis not present

## 2016-05-11 ENCOUNTER — Other Ambulatory Visit: Payer: Self-pay | Admitting: *Deleted

## 2016-05-11 MED ORDER — APIXABAN 2.5 MG PO TABS: 2.5000 mg | ORAL_TABLET | Freq: Two times a day (BID) | ORAL | 1 refills | Status: DC

## 2016-06-11 ENCOUNTER — Other Ambulatory Visit: Payer: Self-pay | Admitting: Neurology

## 2016-06-21 DIAGNOSIS — H401133 Primary open-angle glaucoma, bilateral, severe stage: Secondary | ICD-10-CM | POA: Diagnosis not present

## 2016-07-25 DIAGNOSIS — H401133 Primary open-angle glaucoma, bilateral, severe stage: Secondary | ICD-10-CM | POA: Diagnosis not present

## 2016-07-27 ENCOUNTER — Telehealth: Payer: Self-pay | Admitting: Family Medicine

## 2016-07-27 NOTE — Telephone Encounter (Signed)
Spoke to pt daughter. She will call back to schedule pt for AWV + labs with Virl AxeLesia and CPE with PCP

## 2016-08-30 ENCOUNTER — Encounter: Payer: Self-pay | Admitting: Family Medicine

## 2016-08-30 ENCOUNTER — Ambulatory Visit (INDEPENDENT_AMBULATORY_CARE_PROVIDER_SITE_OTHER): Payer: PPO | Admitting: Family Medicine

## 2016-08-30 VITALS — BP 122/78 | HR 66 | Temp 97.4°F

## 2016-08-30 DIAGNOSIS — I482 Chronic atrial fibrillation, unspecified: Secondary | ICD-10-CM

## 2016-08-30 DIAGNOSIS — Z8673 Personal history of transient ischemic attack (TIA), and cerebral infarction without residual deficits: Secondary | ICD-10-CM

## 2016-08-30 DIAGNOSIS — E039 Hypothyroidism, unspecified: Secondary | ICD-10-CM

## 2016-08-30 DIAGNOSIS — Z23 Encounter for immunization: Secondary | ICD-10-CM

## 2016-08-30 DIAGNOSIS — E785 Hyperlipidemia, unspecified: Secondary | ICD-10-CM | POA: Diagnosis not present

## 2016-08-30 DIAGNOSIS — I10 Essential (primary) hypertension: Secondary | ICD-10-CM

## 2016-08-30 DIAGNOSIS — R441 Visual hallucinations: Secondary | ICD-10-CM

## 2016-08-30 DIAGNOSIS — R351 Nocturia: Secondary | ICD-10-CM

## 2016-08-30 DIAGNOSIS — N401 Enlarged prostate with lower urinary tract symptoms: Secondary | ICD-10-CM

## 2016-08-30 LAB — POC URINALSYSI DIPSTICK (AUTOMATED)
BILIRUBIN UA: NEGATIVE
Glucose, UA: NEGATIVE
KETONES UA: NEGATIVE
LEUKOCYTES UA: NEGATIVE
Nitrite, UA: NEGATIVE
PH UA: 5.5
PROTEIN UA: NEGATIVE
Urobilinogen, UA: 0.2

## 2016-08-30 MED ORDER — TAMSULOSIN HCL 0.4 MG PO CAPS: 0.4000 mg | ORAL_CAPSULE | Freq: Every day | ORAL | 3 refills | Status: DC

## 2016-08-30 NOTE — Progress Notes (Signed)
BP 122/78 (BP Location: Right Arm, Patient Position: Sitting, Cuff Size: Normal)   Pulse 66   Temp 97.4 F (36.3 C) (Oral)   SpO2 97%    CC: ?hallucinations Subjective:    Patient ID: Jacob [name], male    DOB: [birth year], 83 y.o.   MRN: [id number]  HPI: Jacob [name] is a 83 y.o. male presenting on 08/30/2016 for Hallucinations (Daughter says he is having visual hallucinations. Would like him checked for a UTI. )   Here with wife and daughter.  Several day history of visual hallucinations. No auditory or tactile hallucinations.  H/o macular degeneration and glaucoma. Sees ophtho regularly, last 06/2016.  Daughter asks about Maureen Ralphs Syndrome.  Wife concerned about ongoing nocturia affecting patient and wife's quality of sleep.   No fevers/chills, denies chest or abd pain, palpitations, headache.  No recent falls Good appetite Normal stools  Pt and family notice slight worsening of memory. No personality or behavioral changes. Denies depression/anxiety.   Relevant past medical, surgical, family and social history reviewed and updated as indicated. Interim medical history since our last visit reviewed. Allergies and medications reviewed and updated. Current Outpatient Prescriptions on File Prior to Visit  Medication Sig  . apixaban (ELIQUIS) 2.5 MG TABS tablet Take 1 tablet (2.5 mg total) by mouth 2 (two) times daily.  Marland Kitchen latanoprost (XALATAN) 0.005 % ophthalmic solution Place 1 drop into both eyes at bedtime.  Marland Kitchen levothyroxine (SYNTHROID, LEVOTHROID) 88 MCG tablet Take 1 tablet (88 mcg total) by mouth daily before breakfast.  . simvastatin (ZOCOR) 20 MG tablet TAKE 1 TABLET (20 MG TOTAL) BY MOUTH DAILY.   No current facility-administered medications on file prior to visit.     Review of Systems Per HPI unless specifically indicated in ROS section     Objective:    BP 122/78 (BP Location: Right Arm, Patient Position: Sitting, Cuff Size: Normal)   Pulse 66    Temp 97.4 F (36.3 C) (Oral)   SpO2 97%   Wt Readings from Last 3 Encounters:  12/15/15 146 lb (66.2 kg)  12/02/15 141 lb (64 kg)  11/30/15 143 lb 4 oz (65 kg)    Physical Exam  Constitutional: He is oriented to person, place, and time. He appears well-developed and well-nourished. No distress.  HENT:  Mouth/Throat: Oropharynx is clear and moist. No oropharyngeal exudate.  HOH  Eyes: Conjunctivae and EOM are normal. Pupils are equal, round, and reactive to light. No scleral icterus.  Neck: Normal range of motion. Neck supple.  Cardiovascular: Normal rate, regular rhythm, normal heart sounds and intact distal pulses.   No murmur heard. Pulmonary/Chest: Effort normal and breath sounds normal. No respiratory distress. He has no wheezes. He has no rales.  Abdominal: Soft. Bowel sounds are normal. He exhibits no distension and no mass. There is no tenderness. There is no rebound and no guarding.  Musculoskeletal: He exhibits no edema.  Lymphadenopathy:    He has no cervical adenopathy.  Neurological: He is alert and oriented to person, place, and time.  Skin: Skin is warm and dry. No rash noted.  Psychiatric: He has a normal mood and affect.  Nursing note and vitals reviewed.  No results found for: PSA     Assessment & Plan:   Problem List Items Addressed This Visit      Chronic   Chronic atrial fibrillation (HCC) (Chronic)    Sounds regular today. Compliant with eliquis 2.5mg  bid.  Other   Dyslipidemia, goal LDL below 70 - on statin, followed by primary physician. (Chronic)    Chronic, tolerating simvastatin - update LDL. Goal <70.      Relevant Orders   LDL Cholesterol, Direct   Essential hypertension (Chronic)    Chronic, stable off meds.       History of cardioembolic cerebrovascular accident (CVA)   Hypothyroidism    Update TSH. Wife gives him levothyroxine with am meds and breakfast. Discussed dosing of thyroid replacement - suggested right when he wakes up  in am with glass of water, if feasible.       Relevant Orders   TSH   Nocturia associated with benign prostatic hyperplasia    Anticipate BPH related. This is bothersome to patient and his wife - will trial flomax 0.4mg  nightly. No upcoming eye surgery. Discussed monitoring for hypotension symptoms.       Relevant Medications   tamsulosin (FLOMAX) 0.4 MG CAPS capsule   Visual hallucinations - Primary    New visual hallucinations over last few days. Update labs (CMP, CBC, TSH). UA with 1+ blood, but micro WNL. UCx not sent.  Not currently bothersome to patient - will monitor for now.  No signs of progressive dementia, parkinsonisms, or infection. ?visual release hallucinations. Consider trial aricept or lexapro.       Relevant Orders   Comprehensive metabolic panel   TSH   CBC with Differential/Platelet   POCT Urinalysis Dipstick (Automated) (Completed)    Other Visit Diagnoses    Need for influenza vaccination       Relevant Orders   Flu Vaccine QUAD 36+ mos PF IM (Fluarix & Fluzone Quad PF) (Completed)       Follow up plan: Return in about 4 weeks (around 09/27/2016) for follow up visit.  Eustaquio BoydenJavier Nicoles Sedlacek, MD

## 2016-08-30 NOTE — Patient Instructions (Addendum)
Trate tamsulosin para orinar menos cada noche.  Examen de orina hoy. laboratorios de sangre hoy para revisar niveles de tyroide, Customer service managerelectrolitos, etc.  Gusto verlos hoy. Llamenos con preguntas. Regresar en 1 mes.  Flu shot today.

## 2016-08-30 NOTE — Assessment & Plan Note (Signed)
Sounds regular today. Compliant with eliquis 2.5mg  bid.

## 2016-08-30 NOTE — Assessment & Plan Note (Signed)
Chronic, tolerating simvastatin - update LDL. Goal <70.

## 2016-08-30 NOTE — Assessment & Plan Note (Signed)
Chronic, stable off meds.  

## 2016-08-30 NOTE — Progress Notes (Signed)
Pre visit review using our clinic review tool, if applicable. No additional management support is needed unless otherwise documented below in the visit note. 

## 2016-08-30 NOTE — Assessment & Plan Note (Signed)
L anterior MCA territory embolic stroke due to afib early 2017

## 2016-08-30 NOTE — Assessment & Plan Note (Addendum)
Update TSH. Wife gives him levothyroxine with am meds and breakfast. Discussed dosing of thyroid replacement - suggested right when he wakes up in am with glass of water, if feasible.

## 2016-08-30 NOTE — Assessment & Plan Note (Signed)
Anticipate BPH related. This is bothersome to patient and his wife - will trial flomax 0.4mg  nightly. No upcoming eye surgery. Discussed monitoring for hypotension symptoms.

## 2016-08-30 NOTE — Assessment & Plan Note (Signed)
New visual hallucinations over last few days. Update labs (CMP, CBC, TSH). UA with 1+ blood, but micro WNL. UCx not sent.  Not currently bothersome to patient - will monitor for now.  No signs of progressive dementia, parkinsonisms, or infection. ?visual release hallucinations. Consider trial aricept or lexapro.

## 2016-08-31 LAB — CBC WITH DIFFERENTIAL/PLATELET
Basophils Absolute: 0 10*3/uL (ref 0.0–0.1)
Basophils Relative: 0.2 % (ref 0.0–3.0)
EOS PCT: 2.2 % (ref 0.0–5.0)
Eosinophils Absolute: 0.2 10*3/uL (ref 0.0–0.7)
HCT: 34 % — ABNORMAL LOW (ref 39.0–52.0)
Hemoglobin: 11.5 g/dL — ABNORMAL LOW (ref 13.0–17.0)
Lymphocytes Relative: 13.3 % (ref 12.0–46.0)
Lymphs Abs: 1.1 10*3/uL (ref 0.7–4.0)
MCHC: 33.7 g/dL (ref 30.0–36.0)
MCV: 93.4 fl (ref 78.0–100.0)
MONO ABS: 0.3 10*3/uL (ref 0.1–1.0)
MONOS PCT: 3.5 % (ref 3.0–12.0)
Neutro Abs: 6.5 10*3/uL (ref 1.4–7.7)
Neutrophils Relative %: 80.8 % — ABNORMAL HIGH (ref 43.0–77.0)
Platelets: 238 10*3/uL (ref 150.0–400.0)
RBC: 3.64 Mil/uL — ABNORMAL LOW (ref 4.22–5.81)
RDW: 14.3 % (ref 11.5–15.5)
WBC: 8.1 10*3/uL (ref 4.0–10.5)

## 2016-08-31 LAB — COMPREHENSIVE METABOLIC PANEL
ALBUMIN: 3.9 g/dL (ref 3.5–5.2)
ALT: 13 U/L (ref 0–53)
AST: 20 U/L (ref 0–37)
Alkaline Phosphatase: 94 U/L (ref 39–117)
BUN: 26 mg/dL — ABNORMAL HIGH (ref 6–23)
CHLORIDE: 104 meq/L (ref 96–112)
CO2: 30 mEq/L (ref 19–32)
CREATININE: 1.12 mg/dL (ref 0.40–1.50)
Calcium: 9.4 mg/dL (ref 8.4–10.5)
GFR: 64.35 mL/min (ref >60.00)
Glucose, Bld: 134 mg/dL — ABNORMAL HIGH (ref 70–99)
POTASSIUM: 4.4 meq/L (ref 3.5–5.1)
Sodium: 140 mEq/L (ref 135–145)
Total Bilirubin: 0.3 mg/dL (ref 0.2–1.2)
Total Protein: 7.8 g/dL (ref 6.0–8.3)

## 2016-08-31 LAB — TSH: TSH: 0.63 u[IU]/mL (ref 0.35–4.50)

## 2016-08-31 LAB — LDL CHOLESTEROL, DIRECT: Direct LDL: 68 mg/dL

## 2016-09-02 ENCOUNTER — Encounter: Payer: Self-pay | Admitting: *Deleted

## 2016-09-21 ENCOUNTER — Other Ambulatory Visit: Payer: Self-pay | Admitting: Family Medicine

## 2016-10-01 ENCOUNTER — Telehealth: Payer: Self-pay | Admitting: Family Medicine

## 2016-10-01 NOTE — Telephone Encounter (Signed)
[facility] - Client TELEPHONE ADVICE RECORD [facility] Patient Name: Jacob [name] DOB: 1916/09/14 Initial Comment Caller states, her father has become very incontinent. He can hold his urine at all. Flo-max was prescribed. The rx. stopped working. Did not take the rx. yesterday at all. Verified Nurse Assessment Nurse: Debera Latalston, RN, Tinnie GensJeffrey Date/Time Lamount Cohen(Eastern Time): 10/01/2016 11:43:59 AM Confirm and document reason for call. If symptomatic, describe symptoms. ---Caller states, her father has become very incontinent. He can hold his urine at all. Flo-max was prescribed. The rx. stopped working. Did not take the rx. yesterday at all. Does the patient have any new or worsening symptoms? ---Yes Will a triage be completed? ---Yes Related visit to physician within the last 2 weeks? ---No Does the PT have any chronic conditions? (i.e. diabetes, asthma, etc.) ---Yes List chronic conditions. ---Afib, hypothyroid, hyperlipidemia Is this a behavioral health or substance abuse call? ---No Guidelines Guideline Title Affirmed Question Affirmed Notes Urinary Symptoms [1] Can't control passage of urine (i.e., urinary incontinence) AND [2] new onset (< 2 weeks) or worsening Final Disposition User See Physician within 24 Hours [place]Ralston, RN, Tinnie GensJeffrey Comments [facility]: Symptoms have not changed. Referrals [facility] Disagree/Comply: Comply

## 2016-10-03 NOTE — Telephone Encounter (Signed)
Has appt this week.

## 2016-10-03 NOTE — Telephone Encounter (Signed)
Unable to reach [place] for more info.

## 2016-10-06 ENCOUNTER — Ambulatory Visit (INDEPENDENT_AMBULATORY_CARE_PROVIDER_SITE_OTHER): Payer: PPO | Admitting: Family Medicine

## 2016-10-06 ENCOUNTER — Encounter: Payer: Self-pay | Admitting: Family Medicine

## 2016-10-06 VITALS — BP 138/60 | HR 68 | Temp 97.6°F | Wt 150.0 lb

## 2016-10-06 DIAGNOSIS — R351 Nocturia: Secondary | ICD-10-CM

## 2016-10-06 DIAGNOSIS — N3941 Urge incontinence: Secondary | ICD-10-CM | POA: Diagnosis not present

## 2016-10-06 DIAGNOSIS — R441 Visual hallucinations: Secondary | ICD-10-CM | POA: Diagnosis not present

## 2016-10-06 DIAGNOSIS — R350 Frequency of micturition: Secondary | ICD-10-CM

## 2016-10-06 DIAGNOSIS — N401 Enlarged prostate with lower urinary tract symptoms: Secondary | ICD-10-CM

## 2016-10-06 LAB — POC URINALSYSI DIPSTICK (AUTOMATED)
BILIRUBIN UA: NEGATIVE
Glucose, UA: NEGATIVE
KETONES UA: NEGATIVE
LEUKOCYTES UA: NEGATIVE
Nitrite, UA: NEGATIVE
PROTEIN UA: NEGATIVE
Spec Grav, UA: >=1.03
Urobilinogen, UA: NEGATIVE
pH, UA: 6

## 2016-10-06 MED ORDER — MIRABEGRON ER 25 MG PO TB24: 25.0000 mg | ORAL_TABLET | Freq: Every day | ORAL | 3 refills | Status: AC

## 2016-10-06 NOTE — Assessment & Plan Note (Addendum)
Persistent trouble now with isolated episodes of full incontinence.  UA today stable.  No stress incontinence sxs.  Discussed anticipated combination of overflow incontinence and urge incontinence.  Flomax not helpful. Will stop flomax, start myrbetriq. Avoid anticholinergics due to age and side effect profile.  Discussed bladder training, rec bathroom Q3 hrs on schedule, and at night prior to bed. Discussed limiting fluids after 7pm.  RTC 14mo if no improvement, o/w RTC 4 mo f/u visit. Pt and family agree with plan.

## 2016-10-06 NOTE — Assessment & Plan Note (Signed)
This has improved.

## 2016-10-06 NOTE — Assessment & Plan Note (Signed)
No significant change with flomax x 1 month - will stop and trial myrbetriq. See below.

## 2016-10-06 NOTE — Patient Instructions (Signed)
Pare flomax  Comineze myrbetriq 25mg  diarios. Dejeme saber como le Clear Channel Communicationsayuda esta medicina.  Siga usando depens pampers. Trate de usar bao en un horario - cada 2-3 horas durante el dia. Pare de tomar liquid despues de las 7pm.

## 2016-10-06 NOTE — Progress Notes (Addendum)
BP 138/60   Pulse 68   Temp 97.6 F (36.4 C)   Wt 150 lb (68 kg)   SpO2 98%   BMI 23.49 kg/m    CC: f/u urine incontinence Subjective:    Patient ID: Jacob [name], male    DOB: 06/15/1917, 83 y.o.   MRN: [id number]  HPI: Jacob [name] is a 83 y.o. male presenting on 10/06/2016 for Follow-up and Urinary Incontinence   See prior note for details. Last visit started on flomax for presumed nocturia associated with BPH.   Worsening incontinence over the weekend - with 3 episodes of frank incontinence in 1 night.   No stress incontinence sxs.  Using depens diaper.  Overall flomax not helpful.   Relevant past medical, surgical, family and social history reviewed and updated as indicated. Interim medical history since our last visit reviewed. Allergies and medications reviewed and updated. Current Outpatient Prescriptions on File Prior to Visit  Medication Sig  . apixaban (ELIQUIS) 2.5 MG TABS tablet Take 1 tablet (2.5 mg total) by mouth 2 (two) times daily.  . dorzolamide (TRUSOPT) 2 % ophthalmic solution Place 1 drop into both eyes daily.  Marland Kitchen. latanoprost (XALATAN) 0.005 % ophthalmic solution Place 1 drop into both eyes at bedtime.  Marland Kitchen. levothyroxine (SYNTHROID, LEVOTHROID) 88 MCG tablet TAKE 1 TABLET (88 MCG TOTAL) BY MOUTH DAILY BEFORE BREAKFAST.  Marland Kitchen. simvastatin (ZOCOR) 20 MG tablet TAKE 1 TABLET (20 MG TOTAL) BY MOUTH DAILY.   No current facility-administered medications on file prior to visit.     Review of Systems Per HPI unless specifically indicated in ROS section     Objective:    BP 138/60   Pulse 68   Temp 97.6 F (36.4 C)   Wt 150 lb (68 kg)   SpO2 98%   BMI 23.49 kg/m   Wt Readings from Last 3 Encounters:  10/06/16 150 lb (68 kg)  12/15/15 146 lb (66.2 kg)  12/02/15 141 lb (64 kg)    Physical Exam  Constitutional: He appears well-developed and well-nourished. No distress.  HENT:  Mouth/Throat: Oropharynx is clear and moist. No oropharyngeal exudate.    Cardiovascular: Normal rate, regular rhythm, normal heart sounds and intact distal pulses.   No murmur heard. Pulmonary/Chest: Effort normal and breath sounds normal. No respiratory distress. He has no wheezes. He has no rales.  Abdominal: Soft. Normal appearance and bowel sounds are normal. He exhibits no distension and no mass. There is no hepatosplenomegaly. There is no tenderness. There is no rebound, no guarding and no CVA tenderness.  Musculoskeletal: He exhibits edema (tr).  Nursing note and vitals reviewed.  Results for orders placed or performed in visit on 10/06/16  POCT Urinalysis Dipstick (Automated)  Result Value Ref Range   Color, UA YELLOW    Clarity, UA CLEAR    Glucose, UA NEG    Bilirubin, UA NEG    Ketones, UA NEG    Spec Grav, UA >=1.030    Blood, UA tr    pH, UA 6.0    Protein, UA NEG    Urobilinogen, UA negative    Nitrite, UA NEG    Leukocytes, UA Negative Negative      Assessment & Plan:   Problem List Items Addressed This Visit    Nocturia associated with benign prostatic hyperplasia    No significant change with flomax x 1 month - will stop and trial myrbetriq. See below.       Urge urinary incontinence -  Primary    Persistent trouble now with isolated episodes of full incontinence.  UA today stable.  No stress incontinence sxs.  Discussed anticipated combination of overflow incontinence and urge incontinence.  Flomax not helpful. Will stop flomax, start myrbetriq. Avoid anticholinergics due to age and side effect profile.  Discussed bladder training, rec bathroom Q3 hrs on schedule, and at night prior to bed. Discussed limiting fluids after 7pm.  RTC 91mo if no improvement, o/w RTC 4 mo f/u visit. Pt and family agree with plan.      Relevant Medications   mirabegron ER (MYRBETRIQ) 25 MG TB24 tablet   Visual hallucinations    This has improved.        Other Visit Diagnoses    Urinary frequency       Relevant Orders   POCT Urinalysis  Dipstick (Automated) (Completed)       Follow up plan: Return for follow up visit.  Eustaquio Boyden, MD

## 2016-11-03 ENCOUNTER — Telehealth: Payer: Self-pay | Admitting: Family Medicine

## 2016-11-03 NOTE — Telephone Encounter (Signed)
Patient Name: Sherre LainRAMON Rief  DOB: 1917/03/16    Initial Comment Caller states her Dad is having bi lateral knee pain. No visible swelling. Pain on both knees. Would like something called in for comfort of the knee pain. 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   Nurse Assessment      Guidelines    Guideline Title Affirmed Question Affirmed Notes       Final Disposition User   FINAL ATTEMPT MADE - message left Stefano GaulStringer, Charity fundraiserN, Dwana CurdVera

## 2016-11-04 NOTE — Telephone Encounter (Signed)
Lm on Jacob [name]'s vm requesting a call back

## 2016-11-04 NOTE — Telephone Encounter (Signed)
Spoke with daughter. He has tried tylenol 500mg  BID. Will increase to 1000mg  TID and call back in 1 week if no help.

## 2016-11-04 NOTE — Telephone Encounter (Signed)
What has he tried?  Has he tried 1 gram of tylenol up to 3 times a day?  If not, then try that first.  If so then let me know so we can consider options.  Thanks.

## 2016-11-08 ENCOUNTER — Other Ambulatory Visit: Payer: Self-pay | Admitting: Cardiology

## 2016-11-09 ENCOUNTER — Other Ambulatory Visit: Payer: Self-pay | Admitting: Cardiology

## 2016-11-14 ENCOUNTER — Telehealth: Payer: Self-pay | Admitting: Family Medicine

## 2016-11-14 MED ORDER — TRAZODONE HCL 50 MG PO TABS: 25.0000 mg | ORAL_TABLET | Freq: Every evening | ORAL | 3 refills | Status: DC | PRN

## 2016-11-14 NOTE — Telephone Encounter (Signed)
[facility] - Client TELEPHONE ADVICE RECORD [facility] Patient Name: Jacob [name] DOB: April 29, 1917 Initial Comment Caller states father waking up every 2 hours at night, mom takes care of him, she is showing signs of sleep deprivation, they need something for him to sleep at night Nurse Assessment Nurse: Lane HackerHarley, RN, Elvin SoWindy Date/Time (Eastern Time): 11/14/2016 10:44:33 AM Confirm and document reason for call. If symptomatic, describe symptoms. ---Caller states father waking up every 2 hours at night, and her mom takes care of him, she is showing signs of sleep deprivation. They need something for him to sleep at night. They've tried bladder control meds as he was waking up to urinate and it is not helping. "They can handle a wet bed, but can't handle not sleeping." Does the patient have any new or worsening symptoms? ---Yes Will a triage be completed? ---Yes Related visit to physician within the last 2 weeks? ---No Does the PT have any chronic conditions? (i.e. diabetes, asthma, etc.) ---Yes List chronic conditions. ---taking 2 Tylenol for knee pain TID which is working well, hypothyroid, Afib, CVA x 2 last year, and expressive aphasia at times with 1 or 2 words Is this a behavioral health or substance abuse call? ---No Guidelines Guideline Title Affirmed Question Affirmed Notes Insomnia Requesting medication for sleep ("sleeping pill") Final Disposition User Call PCP within 24 Hours [place], Charity fundraiserN, MedtronicWindy Comments Uses [facility] on [address] [place]. Referrals REFERRED TO PCP OFFICE Disagree/Comply: Comply

## 2016-11-14 NOTE — Telephone Encounter (Addendum)
Noted. Would try melatonin 5mg  at night. If ineffective, may try trazodone 25-50mg  PRN insomnia. Sent to pharmacy.  Update us with effect.

## 2016-11-14 NOTE — Addendum Note (Signed)
Addended by: Eustaquio BoydenGUTIERREZ, Shayn Madole on: 11/14/2016 02:01 PM   Modules accepted: Orders

## 2016-11-14 NOTE — Telephone Encounter (Signed)
Message left advising patient's daughter.  

## 2016-11-18 ENCOUNTER — Telehealth: Payer: Self-pay | Admitting: *Deleted

## 2016-11-18 NOTE — Telephone Encounter (Signed)
Kathie RhodesBetty (daughter) [phone] Requesting a call back today before you leave

## 2016-11-18 NOTE — Telephone Encounter (Signed)
Spoke with patient's daughter. She said that they did try the full dose of the trazodone and it didn't help. Lorazepam called in as directed. Appt scheduled and notified of recommendations.

## 2016-11-18 NOTE — Telephone Encounter (Signed)
pts daughter, Kathie RhodesBetty contacted office regarding pts sleep habits. She states she has given him both melatonin and trazodone with no relief. She reports pt was up all night and is requesting a new medication to help him sleep. pls advise

## 2016-11-18 NOTE — Telephone Encounter (Signed)
Did we try full trazodone 50mg  dose?  If so, may stop trazodone and phone in lorazepam 1mg  nightly PRN sleep #30 RF0. plz add to med list if done. Start at 1/2 tab to monitor effect.  plz schedule f/u in office over next 1-2 wks.  Recommend no daytime naps and having bedtime routine.

## 2016-11-24 ENCOUNTER — Telehealth: Payer: Self-pay | Admitting: *Deleted

## 2016-11-24 MED ORDER — FINASTERIDE 5 MG PO TABS: 5.0000 mg | ORAL_TABLET | Freq: Every day | ORAL | 3 refills | Status: AC

## 2016-11-24 MED ORDER — TAMSULOSIN HCL 0.4 MG PO CAPS: 0.8000 mg | ORAL_CAPSULE | Freq: Every day | ORAL | 3 refills | Status: AC

## 2016-11-24 NOTE — Telephone Encounter (Signed)
Spoke w/ daughter. Sounds like predominant issue keeping him up is nocturia. Recommend continue myrbetriq in am, add flomax at night 2 tab, add finasteride. Discussed mechanism of action of all meds.  Update us with effect over next few weeks.

## 2016-11-24 NOTE — Telephone Encounter (Signed)
pts daughter contacted office and states they have given pt lorazepam as directed and pt is still not sleeping. He had an upcoming appt sched for 4/5 but was cancelled due to Dr Reece AgarG being out of office. Kathie RhodesBetty is requesting a call back to discuss what can be done. They are not wanting to endure the weekend without him sleeping and is wanting a resolution. Offered earlier appt that day, but Kathie RhodesBetty has to work and is unable to reschedule. Requesting a call back to discuss

## 2016-12-01 ENCOUNTER — Emergency Department (HOSPITAL_COMMUNITY): Payer: PPO

## 2016-12-01 ENCOUNTER — Ambulatory Visit: Payer: PPO | Admitting: Family Medicine

## 2016-12-01 ENCOUNTER — Inpatient Hospital Stay (HOSPITAL_COMMUNITY)
Admission: EM | Admit: 2016-12-01 | Discharge: 2016-12-07 | DRG: 562 | Disposition: A | Discharge status: Skilled Nursing Facility | Payer: PPO | Attending: General Surgery | Admitting: General Surgery

## 2016-12-01 ENCOUNTER — Encounter (HOSPITAL_COMMUNITY): Payer: Self-pay

## 2016-12-01 DIAGNOSIS — W1830XA Fall on same level, unspecified, initial encounter: Secondary | ICD-10-CM | POA: Diagnosis not present

## 2016-12-01 DIAGNOSIS — N179 Acute kidney failure, unspecified: Secondary | ICD-10-CM | POA: Diagnosis not present

## 2016-12-01 DIAGNOSIS — T148XXA Other injury of unspecified body region, initial encounter: Secondary | ICD-10-CM | POA: Diagnosis not present

## 2016-12-01 DIAGNOSIS — Z87442 Personal history of urinary calculi: Secondary | ICD-10-CM | POA: Diagnosis not present

## 2016-12-01 DIAGNOSIS — I482 Chronic atrial fibrillation: Secondary | ICD-10-CM | POA: Diagnosis present

## 2016-12-01 DIAGNOSIS — R9431 Abnormal electrocardiogram [ECG] [EKG]: Secondary | ICD-10-CM | POA: Diagnosis not present

## 2016-12-01 DIAGNOSIS — Z8042 Family history of malignant neoplasm of prostate: Secondary | ICD-10-CM

## 2016-12-01 DIAGNOSIS — H409 Unspecified glaucoma: Secondary | ICD-10-CM | POA: Diagnosis not present

## 2016-12-01 DIAGNOSIS — Z66 Do not resuscitate: Secondary | ICD-10-CM | POA: Diagnosis present

## 2016-12-01 DIAGNOSIS — S0990XA Unspecified injury of head, initial encounter: Secondary | ICD-10-CM | POA: Diagnosis not present

## 2016-12-01 DIAGNOSIS — Z951 Presence of aortocoronary bypass graft: Secondary | ICD-10-CM

## 2016-12-01 DIAGNOSIS — S52611A Displaced fracture of right ulna styloid process, initial encounter for closed fracture: Secondary | ICD-10-CM | POA: Diagnosis not present

## 2016-12-01 DIAGNOSIS — S199XXA Unspecified injury of neck, initial encounter: Secondary | ICD-10-CM | POA: Diagnosis not present

## 2016-12-01 DIAGNOSIS — I2510 Atherosclerotic heart disease of native coronary artery without angina pectoris: Secondary | ICD-10-CM | POA: Diagnosis present

## 2016-12-01 DIAGNOSIS — Z7901 Long term (current) use of anticoagulants: Secondary | ICD-10-CM | POA: Diagnosis not present

## 2016-12-01 DIAGNOSIS — Y92009 Unspecified place in unspecified non-institutional (private) residence as the place of occurrence of the external cause: Secondary | ICD-10-CM | POA: Diagnosis not present

## 2016-12-01 DIAGNOSIS — L89312 Pressure ulcer of right buttock, stage 2: Secondary | ICD-10-CM | POA: Diagnosis not present

## 2016-12-01 DIAGNOSIS — E039 Hypothyroidism, unspecified: Secondary | ICD-10-CM | POA: Diagnosis present

## 2016-12-01 DIAGNOSIS — N400 Enlarged prostate without lower urinary tract symptoms: Secondary | ICD-10-CM | POA: Diagnosis present

## 2016-12-01 DIAGNOSIS — W19XXXA Unspecified fall, initial encounter: Secondary | ICD-10-CM

## 2016-12-01 DIAGNOSIS — S3993XA Unspecified injury of pelvis, initial encounter: Secondary | ICD-10-CM | POA: Diagnosis not present

## 2016-12-01 DIAGNOSIS — I351 Nonrheumatic aortic (valve) insufficiency: Secondary | ICD-10-CM | POA: Diagnosis not present

## 2016-12-01 DIAGNOSIS — I10 Essential (primary) hypertension: Secondary | ICD-10-CM | POA: Diagnosis not present

## 2016-12-01 DIAGNOSIS — Z800 Family history of malignant neoplasm of digestive organs: Secondary | ICD-10-CM

## 2016-12-01 DIAGNOSIS — L8990 Pressure ulcer of unspecified site, unspecified stage: Secondary | ICD-10-CM | POA: Insufficient documentation

## 2016-12-01 DIAGNOSIS — D649 Anemia, unspecified: Secondary | ICD-10-CM | POA: Diagnosis present

## 2016-12-01 DIAGNOSIS — S3991XA Unspecified injury of abdomen, initial encounter: Secondary | ICD-10-CM | POA: Diagnosis not present

## 2016-12-01 DIAGNOSIS — E785 Hyperlipidemia, unspecified: Secondary | ICD-10-CM | POA: Diagnosis not present

## 2016-12-01 DIAGNOSIS — S52501A Unspecified fracture of the lower end of right radius, initial encounter for closed fracture: Secondary | ICD-10-CM | POA: Diagnosis not present

## 2016-12-01 DIAGNOSIS — S79911A Unspecified injury of right hip, initial encounter: Secondary | ICD-10-CM | POA: Diagnosis not present

## 2016-12-01 DIAGNOSIS — Z8673 Personal history of transient ischemic attack (TIA), and cerebral infarction without residual deficits: Secondary | ICD-10-CM

## 2016-12-01 DIAGNOSIS — S32431A Displaced fracture of anterior column [iliopubic] of right acetabulum, initial encounter for closed fracture: Secondary | ICD-10-CM | POA: Diagnosis not present

## 2016-12-01 DIAGNOSIS — R079 Chest pain, unspecified: Secondary | ICD-10-CM | POA: Diagnosis not present

## 2016-12-01 DIAGNOSIS — M25551 Pain in right hip: Secondary | ICD-10-CM | POA: Diagnosis not present

## 2016-12-01 DIAGNOSIS — R109 Unspecified abdominal pain: Secondary | ICD-10-CM | POA: Diagnosis not present

## 2016-12-01 DIAGNOSIS — S32409A Unspecified fracture of unspecified acetabulum, initial encounter for closed fracture: Secondary | ICD-10-CM | POA: Diagnosis present

## 2016-12-01 DIAGNOSIS — F0390 Unspecified dementia without behavioral disturbance: Secondary | ICD-10-CM | POA: Diagnosis not present

## 2016-12-01 DIAGNOSIS — S299XXA Unspecified injury of thorax, initial encounter: Secondary | ICD-10-CM | POA: Diagnosis not present

## 2016-12-01 DIAGNOSIS — M25531 Pain in right wrist: Secondary | ICD-10-CM | POA: Diagnosis not present

## 2016-12-01 HISTORY — DX: Fracture of neck, unspecified, initial encounter: S12.9XXA

## 2016-12-01 HISTORY — DX: Hypothyroidism, unspecified: E03.9

## 2016-12-01 HISTORY — DX: Unspecified fracture of unspecified acetabulum, initial encounter for closed fracture: S32.409A

## 2016-12-01 HISTORY — DX: Personal history of other diseases of the digestive system: Z87.19

## 2016-12-01 HISTORY — DX: Personal history of peptic ulcer disease: Z87.11

## 2016-12-01 HISTORY — DX: Unspecified osteoarthritis, unspecified site: M19.90

## 2016-12-01 HISTORY — DX: Anemia, unspecified: D64.9

## 2016-12-01 LAB — COMPREHENSIVE METABOLIC PANEL
ALBUMIN: 3.4 g/dL — AB (ref 3.5–5.0)
ALK PHOS: 88 U/L (ref 38–126)
ALT: 22 U/L (ref 17–63)
ANION GAP: 10 (ref 5–15)
AST: 29 U/L (ref 15–41)
BILIRUBIN TOTAL: 0.6 mg/dL (ref 0.3–1.2)
BUN: 26 mg/dL — AB (ref 6–20)
CO2: 24 mmol/L (ref 22–32)
CREATININE: 1.4 mg/dL — AB (ref 0.61–1.24)
Calcium: 9 mg/dL (ref 8.9–10.3)
Chloride: 103 mmol/L (ref 101–111)
GFR calc Af Amer: 47 mL/min — ABNORMAL LOW (ref >60)
GFR calc non Af Amer: 40 mL/min — ABNORMAL LOW (ref >60)
Glucose, Bld: 140 mg/dL — ABNORMAL HIGH (ref 65–99)
POTASSIUM: 3.8 mmol/L (ref 3.5–5.1)
SODIUM: 137 mmol/L (ref 135–145)
TOTAL PROTEIN: 7.4 g/dL (ref 6.5–8.1)

## 2016-12-01 LAB — I-STAT CHEM 8, ED
BUN: 29 mg/dL — ABNORMAL HIGH (ref 6–20)
Calcium, Ion: 1.14 mmol/L — ABNORMAL LOW (ref 1.15–1.40)
Chloride: 105 mmol/L (ref 101–111)
Creatinine, Ser: 1.4 mg/dL — ABNORMAL HIGH (ref 0.61–1.24)
GLUCOSE: 141 mg/dL — AB (ref 65–99)
HEMATOCRIT: 33 % — AB (ref 39.0–52.0)
HEMOGLOBIN: 11.2 g/dL — AB (ref 13.0–17.0)
POTASSIUM: 3.8 mmol/L (ref 3.5–5.1)
Sodium: 139 mmol/L (ref 135–145)
TCO2: 24 mmol/L (ref 0–100)

## 2016-12-01 LAB — CBC WITH DIFFERENTIAL/PLATELET
BASOS ABS: 0 10*3/uL (ref 0.0–0.1)
Basophils Relative: 0 %
Eosinophils Absolute: 0 10*3/uL (ref 0.0–0.7)
Eosinophils Relative: 0 %
HCT: 31.5 % — ABNORMAL LOW (ref 39.0–52.0)
Hemoglobin: 10.7 g/dL — ABNORMAL LOW (ref 13.0–17.0)
Lymphocytes Relative: 6 %
Lymphs Abs: 0.8 10*3/uL (ref 0.7–4.0)
MCH: 32 pg (ref 26.0–34.0)
MCHC: 34 g/dL (ref 30.0–36.0)
MCV: 94.3 fL (ref 78.0–100.0)
MONO ABS: 0.1 10*3/uL (ref 0.1–1.0)
Monocytes Relative: 1 %
Neutro Abs: 13 10*3/uL — ABNORMAL HIGH (ref 1.7–7.7)
Neutrophils Relative %: 93 %
PLATELETS: 229 10*3/uL (ref 150–400)
RBC: 3.34 MIL/uL — AB (ref 4.22–5.81)
RDW: 14 % (ref 11.5–15.5)
WBC: 13.9 10*3/uL — AB (ref 4.0–10.5)

## 2016-12-01 MED ORDER — IOPAMIDOL (ISOVUE-300) INJECTION 61%
INTRAVENOUS | Status: AC
  Administered 2016-12-01: 75 mL
  Filled 2016-12-01: qty 100

## 2016-12-01 MED ORDER — FENTANYL CITRATE (PF) 100 MCG/2ML IJ SOLN
50.0000 ug | INTRAMUSCULAR | Status: DC | PRN
  Administered 2016-12-01 – 2016-12-02 (×2): 50 ug via INTRAVENOUS
  Filled 2016-12-01 (×2): qty 2

## 2016-12-01 NOTE — ED Provider Notes (Signed)
[facility]ENCY DEPT Provider Note   CSN: [id number] Arrival date & time: 12/01/16  2125     History   Chief Complaint Chief Complaint  Patient presents with  . Fall    HPI Jacob [name] is a 83 y.o. male.  The history is provided by the patient and the EMS personnel. A language interpreter was used.  Fall    Jacob [name] is a 83 y.o. male who presents to the Emergency Department complaining of fall.  He presents forearm home for evaluation of injuries following a fall. He was getting changed when he fell, striking his right wrist, right hip. It is unclear if he hit his head but he reports pain to the right side of his head, right chest and abdomen. No known loss of consciousness. He does take Eliquis for atrial fibrillation. His most significant pain is in his right side and his right wrist. He lives at home with his wife. Past Medical History:  Diagnosis Date  . Acute ischemic left MCA stroke (HCC) 10/2015  . Acute right MCA stroke (HCC) 09/23/2015  . C4 cervical fracture (HCC) 2015   with spinal cord contusion - stopped coumadin.  Marland Kitchen CAD in native artery -->  1994   referred for CABG  . Chronic atrial fibrillation (HCC) 12/15/2012  . Diverticulitis   . Dyslipidemia, goal LDL below 70[272.4]     - on statin, followed by primary physician.   . Essential hypertension 05/23/2013  . Glaucoma   . H/O seasonal allergies   . Hip fracture (HCC) 12/15/2015  . History of cardioembolic cerebrovascular accident (CVA) 10/28/2015  . History of kidney stones   . Long term (current) use of anticoagulants 12/15/2012   warfarin  . S/P CABG x 4 1994   a) 1994: LIMA-LAD, SVG-RI, SVG-OM, SVG-RCA; b) 09/2006, Persantine Myoview: fixed inferior defect consistent with infarct/scar without peri-infarct ischemia;; 2-D echo: Mild-mod concentric LVH. Normal EF.  Abnormal relaxation, aortic sclerosis with mild-mod AI    Patient Active Problem List   Diagnosis Date Noted  . Urge urinary incontinence  10/06/2016  . Visual hallucinations 08/30/2016  . History of cardioembolic cerebrovascular accident (CVA) 10/28/2015  . Anemia 09/24/2015  . Nocturia associated with benign prostatic hyperplasia 02/24/2015  . Hypothyroidism 08/02/2014  . Dysphagia 08/02/2014  . Essential hypertension 05/23/2013  . CAD in native artery -->  CABG x4 in 1994   . 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 Dyslipidemia, goal LDL below 70 - on statin, followed by primary physician.   . Chronic atrial fibrillation (HCC) 12/15/2012    Class: Chronic    Past Surgical History:  Procedure Laterality Date  . APPENDECTOMY    . CORONARY ARTERY BYPASS GRAFT  1994   LIMA-LAD, SVG-RI, SVG-OM, SVG-RCA   . FEMUR SURGERY Left 2000   replacement- due to fall  . HAND SURGERY Left 1985  . HIP PINNING,CANNULATED Right 12/16/2015   Procedure: CANNULATED HIP PINNING;  Surgeon: Deeann Saint, MD;  Location: [facility] ORS;  Service: Orthopedics;  Laterality: Right;  . Persantine Myoview  February 2008   Fixed inferior wall defect-consistent with infarct/scar without peri-infarct ischemia  . TRANSTHORACIC ECHOCARDIOGRAM  February 2008   Mild/mod conc LVH, Nl Size & Fxn, Gr 1 DD(abnormal relaxation), mild to moderate aortic regurgitation and mildly sclerotic aortic valve.       Home Medications    Prior to Admission medications   Medication Sig Start  Date End Date Taking? Authorizing Provider  dorzolamide (TRUSOPT) 2 % ophthalmic solution Place 1 drop into both eyes daily. 06/22/16   Historical Provider, MD  ELIQUIS 2.5 MG TABS tablet TAKE 1 TABLET (2.5 MG TOTAL) BY MOUTH 2 (TWO) TIMES DAILY. 11/10/16   Marykay Lex, MD  finasteride (PROSCAR) 5 MG tablet Take 1 tablet (5 mg total) by mouth daily. 11/24/16   Eustaquio Boyden, MD  latanoprost (XALATAN) 0.005 % ophthalmic solution Place 1 drop into both eyes at bedtime.    Historical Provider, MD  levothyroxine (SYNTHROID, LEVOTHROID) 88  MCG tablet TAKE 1 TABLET (88 MCG TOTAL) BY MOUTH DAILY BEFORE BREAKFAST. 09/21/16   Eustaquio Boyden, MD  LORazepam (ATIVAN) 1 MG tablet Take 1 mg by mouth at bedtime.    Historical Provider, MD  mirabegron ER (MYRBETRIQ) 25 MG TB24 tablet Take 1 tablet (25 mg total) by mouth daily. 10/06/16   Eustaquio Boyden, MD  simvastatin (ZOCOR) 20 MG tablet TAKE 1 TABLET (20 MG TOTAL) BY MOUTH DAILY. 06/13/16   Drema Dallas, DO  tamsulosin (FLOMAX) 0.4 MG CAPS capsule Take 2 capsules (0.8 mg total) by mouth daily after supper. 11/24/16   Eustaquio Boyden, MD    Family History Family History  Problem Relation Age of Onset  . Liver cancer Mother   . Prostate cancer Father     Social History Social History  Substance Use Topics  . Smoking status: Never Smoker  . Smokeless tobacco: Never Used  . Alcohol use No     Allergies   Patient has no known allergies.   Review of Systems Review of Systems  All other systems reviewed and are negative.    Physical Exam Updated Vital Signs SpO2 98%   Physical Exam  Constitutional: He appears well-developed and well-nourished.  HENT:  Head: Normocephalic and atraumatic.  Cardiovascular: Normal rate and regular rhythm.   No murmur heard. Pulmonary/Chest: Effort normal and breath sounds normal. No respiratory distress.  Abdominal: Soft. There is no tenderness. There is no rebound and no guarding.  Musculoskeletal: He exhibits no edema.  Swelling, tenderness, deformity to the right wrist. No tenderness to the forearm, elbow, upper arm, shoulder. There is some mild right-sided chest wall tenderness and moderate right midabdominal tenderness. There is tenderness throughout the right hip. No tenderness throughout the thigh, knee, distal leg.  Neurological: He is alert.  Mildly confused  Skin: Skin is warm and dry.  Psychiatric: He has a normal mood and affect. His behavior is normal.  Nursing note and vitals reviewed.    ED Treatments / Results    Labs (all labs ordered are listed, but only abnormal results are displayed) Labs Reviewed  COMPREHENSIVE METABOLIC PANEL  CBC WITH DIFFERENTIAL/PLATELET  URINALYSIS, ROUTINE W REFLEX MICROSCOPIC  I-STAT CHEM 8, ED    EKG  EKG Interpretation None       Radiology No results found.  Procedures Procedures (including critical care time)  Medications Ordered in ED Medications  fentaNYL (SUBLIMAZE) injection 50 mcg (not administered)     Initial Impression / Assessment and Plan / ED Course  I have reviewed the triage vital signs and the nursing notes.  Pertinent labs & imaging results that were available during my care of the patient were reviewed by me and considered in my medical decision making (see chart for details).     Patient here for evaluation of injuries following a fall. He does have a deformity to the right wrist. He has significant tenderness along  his right chest wall, abdominal wall and right hip. Plan to obtain CT to further evaluate for intra-abdominal or intrathoracic injuries. Patient care transferred pending imaging.  Final Clinical Impressions(s) / ED Diagnoses   Final diagnoses:  None    New Prescriptions New Prescriptions   No medications on file     Tilden Fossa, MD 12/02/16 1610

## 2016-12-01 NOTE — ED Triage Notes (Signed)
Pt from home with ems c/o fall. Pt was standing with his wife changing his breif when he fell. Pt family denies pt hitting his head or LOC. Pt c/o 10/10 pain in his right arm, obvious deformity noted, splint place by EMS. 18G in RAC Fentanyl total given by EMS with o2 dropping to 88% so 2L [place] placed on pt.  Pt a fib on the monitor with hx of same. BP 168/80 Hr 70 98% 2L [place]. Nad at this time.

## 2016-12-01 NOTE — ED Notes (Signed)
Pt in CT.

## 2016-12-01 NOTE — ED Notes (Signed)
Patient transported to X-ray 

## 2016-12-02 ENCOUNTER — Emergency Department (HOSPITAL_COMMUNITY): Payer: PPO

## 2016-12-02 DIAGNOSIS — R079 Chest pain, unspecified: Secondary | ICD-10-CM | POA: Diagnosis not present

## 2016-12-02 DIAGNOSIS — S3991XA Unspecified injury of abdomen, initial encounter: Secondary | ICD-10-CM | POA: Diagnosis not present

## 2016-12-02 DIAGNOSIS — D649 Anemia, unspecified: Secondary | ICD-10-CM | POA: Diagnosis present

## 2016-12-02 DIAGNOSIS — H409 Unspecified glaucoma: Secondary | ICD-10-CM | POA: Diagnosis present

## 2016-12-02 DIAGNOSIS — S299XXA Unspecified injury of thorax, initial encounter: Secondary | ICD-10-CM | POA: Diagnosis not present

## 2016-12-02 DIAGNOSIS — R109 Unspecified abdominal pain: Secondary | ICD-10-CM | POA: Diagnosis not present

## 2016-12-02 DIAGNOSIS — S32401A Unspecified fracture of right acetabulum, initial encounter for closed fracture: Secondary | ICD-10-CM | POA: Diagnosis not present

## 2016-12-02 DIAGNOSIS — W19XXXA Unspecified fall, initial encounter: Secondary | ICD-10-CM

## 2016-12-02 DIAGNOSIS — Z8042 Family history of malignant neoplasm of prostate: Secondary | ICD-10-CM | POA: Diagnosis not present

## 2016-12-02 DIAGNOSIS — D6832 Hemorrhagic disorder due to extrinsic circulating anticoagulants: Secondary | ICD-10-CM | POA: Diagnosis not present

## 2016-12-02 DIAGNOSIS — I10 Essential (primary) hypertension: Secondary | ICD-10-CM | POA: Diagnosis present

## 2016-12-02 DIAGNOSIS — Z5189 Encounter for other specified aftercare: Secondary | ICD-10-CM | POA: Diagnosis not present

## 2016-12-02 DIAGNOSIS — S52501A Unspecified fracture of the lower end of right radius, initial encounter for closed fracture: Secondary | ICD-10-CM | POA: Diagnosis not present

## 2016-12-02 DIAGNOSIS — F0390 Unspecified dementia without behavioral disturbance: Secondary | ICD-10-CM | POA: Diagnosis not present

## 2016-12-02 DIAGNOSIS — Z8673 Personal history of transient ischemic attack (TIA), and cerebral infarction without residual deficits: Secondary | ICD-10-CM | POA: Diagnosis not present

## 2016-12-02 DIAGNOSIS — Z7901 Long term (current) use of anticoagulants: Secondary | ICD-10-CM | POA: Diagnosis not present

## 2016-12-02 DIAGNOSIS — N400 Enlarged prostate without lower urinary tract symptoms: Secondary | ICD-10-CM | POA: Diagnosis present

## 2016-12-02 DIAGNOSIS — S52531A Colles' fracture of right radius, initial encounter for closed fracture: Secondary | ICD-10-CM

## 2016-12-02 DIAGNOSIS — E785 Hyperlipidemia, unspecified: Secondary | ICD-10-CM | POA: Diagnosis present

## 2016-12-02 DIAGNOSIS — S52501S Unspecified fracture of the lower end of right radius, sequela: Secondary | ICD-10-CM | POA: Diagnosis not present

## 2016-12-02 DIAGNOSIS — S52611A Displaced fracture of right ulna styloid process, initial encounter for closed fracture: Secondary | ICD-10-CM | POA: Diagnosis present

## 2016-12-02 DIAGNOSIS — R1312 Dysphagia, oropharyngeal phase: Secondary | ICD-10-CM | POA: Diagnosis not present

## 2016-12-02 DIAGNOSIS — S3993XA Unspecified injury of pelvis, initial encounter: Secondary | ICD-10-CM | POA: Diagnosis not present

## 2016-12-02 DIAGNOSIS — M25531 Pain in right wrist: Secondary | ICD-10-CM | POA: Diagnosis present

## 2016-12-02 DIAGNOSIS — S32409A Unspecified fracture of unspecified acetabulum, initial encounter for closed fracture: Secondary | ICD-10-CM | POA: Diagnosis present

## 2016-12-02 DIAGNOSIS — Y92009 Unspecified place in unspecified non-institutional (private) residence as the place of occurrence of the external cause: Secondary | ICD-10-CM | POA: Diagnosis not present

## 2016-12-02 DIAGNOSIS — N179 Acute kidney failure, unspecified: Secondary | ICD-10-CM | POA: Diagnosis present

## 2016-12-02 DIAGNOSIS — E039 Hypothyroidism, unspecified: Secondary | ICD-10-CM | POA: Diagnosis present

## 2016-12-02 DIAGNOSIS — I2510 Atherosclerotic heart disease of native coronary artery without angina pectoris: Secondary | ICD-10-CM | POA: Diagnosis present

## 2016-12-02 DIAGNOSIS — I482 Chronic atrial fibrillation: Secondary | ICD-10-CM | POA: Diagnosis present

## 2016-12-02 DIAGNOSIS — R41841 Cognitive communication deficit: Secondary | ICD-10-CM | POA: Diagnosis not present

## 2016-12-02 DIAGNOSIS — L89312 Pressure ulcer of right buttock, stage 2: Secondary | ICD-10-CM | POA: Diagnosis present

## 2016-12-02 DIAGNOSIS — S32431A Displaced fracture of anterior column [iliopubic] of right acetabulum, initial encounter for closed fracture: Secondary | ICD-10-CM | POA: Diagnosis present

## 2016-12-02 DIAGNOSIS — S92153A Displaced avulsion fracture (chip fracture) of unspecified talus, initial encounter for closed fracture: Secondary | ICD-10-CM | POA: Diagnosis not present

## 2016-12-02 DIAGNOSIS — Z800 Family history of malignant neoplasm of digestive organs: Secondary | ICD-10-CM | POA: Diagnosis not present

## 2016-12-02 DIAGNOSIS — S5291XA Unspecified fracture of right forearm, initial encounter for closed fracture: Secondary | ICD-10-CM | POA: Diagnosis not present

## 2016-12-02 DIAGNOSIS — M6281 Muscle weakness (generalized): Secondary | ICD-10-CM | POA: Diagnosis not present

## 2016-12-02 DIAGNOSIS — Z951 Presence of aortocoronary bypass graft: Secondary | ICD-10-CM | POA: Diagnosis not present

## 2016-12-02 DIAGNOSIS — S32401S Unspecified fracture of right acetabulum, sequela: Secondary | ICD-10-CM | POA: Diagnosis not present

## 2016-12-02 DIAGNOSIS — I351 Nonrheumatic aortic (valve) insufficiency: Secondary | ICD-10-CM | POA: Diagnosis present

## 2016-12-02 DIAGNOSIS — S79911A Unspecified injury of right hip, initial encounter: Secondary | ICD-10-CM | POA: Diagnosis not present

## 2016-12-02 DIAGNOSIS — Z87442 Personal history of urinary calculi: Secondary | ICD-10-CM | POA: Diagnosis not present

## 2016-12-02 DIAGNOSIS — S32401D Unspecified fracture of right acetabulum, subsequent encounter for fracture with routine healing: Secondary | ICD-10-CM | POA: Diagnosis not present

## 2016-12-02 DIAGNOSIS — I4891 Unspecified atrial fibrillation: Secondary | ICD-10-CM | POA: Diagnosis not present

## 2016-12-02 DIAGNOSIS — S199XXA Unspecified injury of neck, initial encounter: Secondary | ICD-10-CM | POA: Diagnosis not present

## 2016-12-02 DIAGNOSIS — M25551 Pain in right hip: Secondary | ICD-10-CM | POA: Diagnosis not present

## 2016-12-02 DIAGNOSIS — W1830XA Fall on same level, unspecified, initial encounter: Secondary | ICD-10-CM | POA: Diagnosis present

## 2016-12-02 DIAGNOSIS — Z66 Do not resuscitate: Secondary | ICD-10-CM | POA: Diagnosis present

## 2016-12-02 DIAGNOSIS — R4182 Altered mental status, unspecified: Secondary | ICD-10-CM | POA: Diagnosis not present

## 2016-12-02 DIAGNOSIS — S0990XA Unspecified injury of head, initial encounter: Secondary | ICD-10-CM | POA: Diagnosis not present

## 2016-12-02 LAB — CBC
HCT: 26.8 % — ABNORMAL LOW (ref 39.0–52.0)
Hemoglobin: 9 g/dL — ABNORMAL LOW (ref 13.0–17.0)
MCH: 31.6 pg (ref 26.0–34.0)
MCHC: 33.6 g/dL (ref 30.0–36.0)
MCV: 94 fL (ref 78.0–100.0)
PLATELETS: 163 10*3/uL (ref 150–400)
RBC: 2.85 MIL/uL — AB (ref 4.22–5.81)
RDW: 13.8 % (ref 11.5–15.5)
WBC: 16.3 10*3/uL — AB (ref 4.0–10.5)

## 2016-12-02 LAB — URINALYSIS, ROUTINE W REFLEX MICROSCOPIC
Bilirubin Urine: NEGATIVE
Glucose, UA: NEGATIVE mg/dL
KETONES UR: NEGATIVE mg/dL
Leukocytes, UA: NEGATIVE
Nitrite: NEGATIVE
PH: 5 (ref 5.0–8.0)
PROTEIN: 30 mg/dL — AB
SQUAMOUS EPITHELIAL / LPF: NONE SEEN
Specific Gravity, Urine: 1.017 (ref 1.005–1.030)

## 2016-12-02 MED ORDER — LEVOTHYROXINE SODIUM 88 MCG PO TABS
88.0000 ug | ORAL_TABLET | Freq: Every day | ORAL | Status: DC
  Administered 2016-12-02 – 2016-12-07 (×6): 88 ug via ORAL
  Filled 2016-12-02 (×7): qty 1

## 2016-12-02 MED ORDER — ACETAMINOPHEN 325 MG PO TABS
650.0000 mg | ORAL_TABLET | ORAL | Status: DC | PRN
  Administered 2016-12-02 – 2016-12-04 (×5): 650 mg via ORAL
  Filled 2016-12-02 (×5): qty 2

## 2016-12-02 MED ORDER — MIRABEGRON ER 25 MG PO TB24
25.0000 mg | ORAL_TABLET | Freq: Every day | ORAL | Status: DC
  Administered 2016-12-02 – 2016-12-07 (×6): 25 mg via ORAL
  Filled 2016-12-02 (×6): qty 1

## 2016-12-02 MED ORDER — ONDANSETRON HCL 4 MG/2ML IJ SOLN
4.0000 mg | Freq: Four times a day (QID) | INTRAMUSCULAR | Status: DC | PRN
  Administered 2016-12-05: 4 mg via INTRAVENOUS
  Filled 2016-12-02: qty 2

## 2016-12-02 MED ORDER — ORAL CARE MOUTH RINSE
15.0000 mL | Freq: Two times a day (BID) | OROMUCOSAL | Status: DC
  Administered 2016-12-02 – 2016-12-06 (×7): 15 mL via OROMUCOSAL

## 2016-12-02 MED ORDER — LORAZEPAM 1 MG PO TABS
1.0000 mg | ORAL_TABLET | Freq: Every day | ORAL | Status: DC
  Administered 2016-12-02 – 2016-12-06 (×5): 1 mg via ORAL
  Filled 2016-12-02 (×5): qty 1

## 2016-12-02 MED ORDER — SIMVASTATIN 20 MG PO TABS
20.0000 mg | ORAL_TABLET | Freq: Every day | ORAL | Status: DC
  Administered 2016-12-02 – 2016-12-06 (×5): 20 mg via ORAL
  Filled 2016-12-02 (×5): qty 1

## 2016-12-02 MED ORDER — PANTOPRAZOLE SODIUM 40 MG IV SOLR: 40.0000 mg | Freq: Every day | INTRAVENOUS | Status: DC

## 2016-12-02 MED ORDER — DORZOLAMIDE HCL 2 % OP SOLN
1.0000 [drp] | Freq: Two times a day (BID) | OPHTHALMIC | Status: DC
  Administered 2016-12-02 – 2016-12-07 (×11): 1 [drp] via OPHTHALMIC
  Filled 2016-12-02: qty 10

## 2016-12-02 MED ORDER — TRAMADOL HCL 50 MG PO TABS
50.0000 mg | ORAL_TABLET | Freq: Four times a day (QID) | ORAL | Status: DC | PRN
  Administered 2016-12-02 – 2016-12-07 (×8): 50 mg via ORAL
  Filled 2016-12-02 (×8): qty 1

## 2016-12-02 MED ORDER — LATANOPROST 0.005 % OP SOLN
1.0000 [drp] | Freq: Every day | OPHTHALMIC | Status: DC
  Administered 2016-12-02 – 2016-12-06 (×5): 1 [drp] via OPHTHALMIC
  Filled 2016-12-02: qty 2.5

## 2016-12-02 MED ORDER — POTASSIUM CHLORIDE IN NACL 20-0.9 MEQ/L-% IV SOLN
INTRAVENOUS | Status: DC
  Administered 2016-12-02 – 2016-12-04 (×3): via INTRAVENOUS
  Administered 2016-12-05: 1 mL via INTRAVENOUS
  Administered 2016-12-05 – 2016-12-07 (×2): via INTRAVENOUS
  Filled 2016-12-02 (×9): qty 1000

## 2016-12-02 MED ORDER — MORPHINE SULFATE (PF) 2 MG/ML IV SOLN
2.0000 mg | INTRAVENOUS | Status: DC | PRN
  Administered 2016-12-04 – 2016-12-05 (×3): 2 mg via INTRAVENOUS
  Filled 2016-12-02 (×3): qty 1

## 2016-12-02 MED ORDER — PANTOPRAZOLE SODIUM 40 MG PO TBEC
40.0000 mg | DELAYED_RELEASE_TABLET | Freq: Every day | ORAL | Status: DC
  Administered 2016-12-02 – 2016-12-07 (×6): 40 mg via ORAL
  Filled 2016-12-02 (×6): qty 1

## 2016-12-02 MED ORDER — ONDANSETRON HCL 4 MG PO TABS: 4.0000 mg | ORAL_TABLET | Freq: Four times a day (QID) | ORAL | Status: DC | PRN

## 2016-12-02 MED ORDER — FINASTERIDE 5 MG PO TABS
5.0000 mg | ORAL_TABLET | Freq: Every day | ORAL | Status: DC
  Administered 2016-12-02 – 2016-12-07 (×6): 5 mg via ORAL
  Filled 2016-12-02 (×6): qty 1

## 2016-12-02 NOTE — Progress Notes (Signed)
Telesitter report given  ?

## 2016-12-02 NOTE — ED Notes (Signed)
Pt returned from CT and connected to the monitor 

## 2016-12-02 NOTE — H&P (Signed)
Jacob [name] is an 83 y.o. male.   Chief Complaint: fall HPI: Jacob [name] presented to the emergency department after a ground-level fall. He was changing his clothes at home when he fell onto his right wrist and hip. He had pain in those areas and was evaluated in the emergency department. He was found to have a right acetabular fracture and a right radius fracture. He also takes Eliquis for atrial fibrillation. His family has left but they made it clear to the emergency department physician that he is a DO NOT RESUSCITATE and they do not want him to have any surgery. I was asked to see him for admission. He is a very poor historian due to dementia.  Past Medical History:  Diagnosis Date  . Acute ischemic left MCA stroke ([place]) 10/2015  . Acute right MCA stroke ([place]) 09/23/2015  . C4 cervical fracture ([place]) 2015   with spinal cord contusion - stopped coumadin.  Marland Kitchen CAD in native artery -->  1994   referred for CABG  . Chronic atrial fibrillation ([place]) 12/15/2012  . Diverticulitis   . Dyslipidemia, goal LDL below 70[272.4]     - on statin, followed by primary physician.   . Essential hypertension 05/23/2013  . Glaucoma   . H/O seasonal allergies   . Hip fracture ([place]) 12/15/2015  . History of cardioembolic cerebrovascular accident (CVA) 10/28/2015  . History of kidney stones   . Long term (current) use of anticoagulants 12/15/2012   warfarin  . S/P CABG x 4 1994   a) 1994: LIMA-LAD, SVG-RI, SVG-OM, SVG-RCA; b) 09/2006, Persantine Myoview: fixed inferior defect consistent with infarct/scar without peri-infarct ischemia;; 2-D echo: Mild-mod concentric LVH. Normal EF.  Abnormal relaxation, aortic sclerosis with mild-mod AI    Past Surgical History:  Procedure Laterality Date  . APPENDECTOMY    . CORONARY ARTERY BYPASS GRAFT  1994   LIMA-LAD, SVG-RI, SVG-OM, SVG-RCA   . FEMUR SURGERY Left 2000   replacement- due to fall  . HAND SURGERY Left 1985  . HIP PINNING,CANNULATED Right 12/16/2015    Procedure: CANNULATED HIP PINNING;  Surgeon: Earnestine Leys, MD;  Location: [facility] ORS;  Service: Orthopedics;  Laterality: Right;  . Persantine Myoview  February 2008   Fixed inferior wall defect-consistent with infarct/scar without peri-infarct ischemia  . TRANSTHORACIC ECHOCARDIOGRAM  February 2008   Mild/mod conc LVH, Nl Size & Fxn, Gr 1 DD(abnormal relaxation), mild to moderate aortic regurgitation and mildly sclerotic aortic valve.    Family History  Problem Relation Age of Onset  . Liver cancer Mother   . Prostate cancer Father    Social History:  reports that he has never smoked. He has never used smokeless tobacco. He reports that he does not drink alcohol or use drugs.  Allergies: No Known Allergies   (Not in a hospital admission)  Results for orders placed or performed during the hospital encounter of 12/01/16 (from the past 48 hour(s))  Comprehensive metabolic panel     Status: Abnormal   Collection Time: 12/01/16 10:23 PM  Result Value Ref Range   Sodium 137 135 - 145 mmol/L   Potassium 3.8 3.5 - 5.1 mmol/L   Chloride 103 101 - 111 mmol/L   CO2 24 22 - 32 mmol/L   Glucose, Bld 140 (H) 65 - 99 mg/dL   BUN 26 (H) 6 - 20 mg/dL   Creatinine, Ser 1.40 (H) 0.61 - 1.24 mg/dL   Calcium 9.0 8.9 - 10.3 mg/dL   Total Protein 7.4 6.5 -  8.1 g/dL   Albumin 3.4 (L) 3.5 - 5.0 g/dL   AST 29 15 - 41 U/L   ALT 22 17 - 63 U/L   Alkaline Phosphatase 88 38 - 126 U/L   Total Bilirubin 0.6 0.3 - 1.2 mg/dL   GFR calc non Af Amer 40 (L) >60 mL/min   GFR calc Af Amer 47 (L) >60 mL/min    Comment: (NOTE) The eGFR has been calculated using the CKD EPI equation. This calculation has not been validated in all clinical situations. eGFR's persistently <60 mL/min signify possible Chronic Kidney Disease.    Anion gap 10 5 - 15  CBC with Differential     Status: Abnormal   Collection Time: 12/01/16 10:23 PM  Result Value Ref Range   WBC 13.9 (H) 4.0 - 10.5 K/uL   RBC 3.34 (L) 4.22 - 5.81  MIL/uL   Hemoglobin 10.7 (L) 13.0 - 17.0 g/dL   HCT 31.5 (L) 39.0 - 52.0 %   MCV 94.3 78.0 - 100.0 fL   MCH 32.0 26.0 - 34.0 pg   MCHC 34.0 30.0 - 36.0 g/dL   RDW 14.0 11.5 - 15.5 %   Platelets 229 150 - 400 K/uL   Neutrophils Relative % 93 %   Lymphocytes Relative 6 %   Monocytes Relative 1 %   Eosinophils Relative 0 %   Basophils Relative 0 %   Neutro Abs 13.0 (H) 1.7 - 7.7 K/uL   Lymphs Abs 0.8 0.7 - 4.0 K/uL   Monocytes Absolute 0.1 0.1 - 1.0 K/uL   Eosinophils Absolute 0.0 0.0 - 0.7 K/uL   Basophils Absolute 0.0 0.0 - 0.1 K/uL   Smear Review MORPHOLOGY UNREMARKABLE   Urinalysis, Routine w reflex microscopic     Status: Abnormal   Collection Time: 12/01/16 10:23 PM  Result Value Ref Range   Color, Urine YELLOW YELLOW   APPearance CLEAR CLEAR   Specific Gravity, Urine 1.017 1.005 - 1.030   pH 5.0 5.0 - 8.0   Glucose, UA NEGATIVE NEGATIVE mg/dL   Hgb urine dipstick MODERATE (A) NEGATIVE   Bilirubin Urine NEGATIVE NEGATIVE   Ketones, ur NEGATIVE NEGATIVE mg/dL   Protein, ur 30 (A) NEGATIVE mg/dL   Nitrite NEGATIVE NEGATIVE   Leukocytes, UA NEGATIVE NEGATIVE   RBC / HPF 0-5 0 - 5 RBC/hpf   WBC, UA 0-5 0 - 5 WBC/hpf   Bacteria, UA RARE (A) NONE SEEN   Squamous Epithelial / LPF NONE SEEN NONE SEEN   Mucous PRESENT   I-stat Chem 8, ED     Status: Abnormal   Collection Time: 12/01/16 10:37 PM  Result Value Ref Range   Sodium 139 135 - 145 mmol/L   Potassium 3.8 3.5 - 5.1 mmol/L   Chloride 105 101 - 111 mmol/L   BUN 29 (H) 6 - 20 mg/dL   Creatinine, Ser 1.40 (H) 0.61 - 1.24 mg/dL   Glucose, Bld 141 (H) 65 - 99 mg/dL   Calcium, Ion 1.14 (L) 1.15 - 1.40 mmol/L   TCO2 24 0 - 100 mmol/L   Hemoglobin 11.2 (L) 13.0 - 17.0 g/dL   HCT 33.0 (L) 39.0 - 52.0 %   Dg Chest 1 View  Result Date: 12/01/2016 CLINICAL DATA:  Ground level fall at home tonight, at 19:40. EXAM: CHEST 1 VIEW COMPARISON:  11/20/2015 FINDINGS: Stable moderate cardiomegaly. Prior sternotomy and CABG. The lungs  are clear. The pulmonary vasculature is normal. There is no pneumothorax. No effusion. Hilar and mediastinal contours  are unremarkable and unchanged. The bones appear osteopenic, but no displaced fractures are evident. Stable healed fracture deformity of the left fifth rib. IMPRESSION: No acute findings. Electronically Signed   By: Andreas Newport M.D.   On: 12/01/2016 22:36   Dg Wrist Complete Right  Result Date: 12/02/2016 CLINICAL DATA:  [place] at home tonight EXAM: RIGHT WRIST - COMPLETE 3+ VIEW COMPARISON:  None. FINDINGS: There is a transverse fracture of the distal radius, intra-articular at the ulnar aspect of the radiocarpal articulation. There is a transverse fracture of the ulnar styloid. No dislocation. IMPRESSION: Fractures of the distal radius and ulnar styloid. Electronically Signed   By: Andreas Newport M.D.   On: 12/02/2016 00:56   Ct Head Wo Contrast  Result Date: 12/02/2016 CLINICAL DATA:  [place] at home tonight. EXAM: CT HEAD WITHOUT CONTRAST CT CERVICAL SPINE WITHOUT CONTRAST TECHNIQUE: Multidetector CT imaging of the head and cervical spine was performed following the standard protocol without intravenous contrast. Multiplanar CT image reconstructions of the cervical spine were also generated. COMPARISON:  12/15/2015 FINDINGS: CT HEAD FINDINGS Brain: There is no intracranial hemorrhage, mass or evidence of acute infarction. There is prior left frontal infarction with encephalomalacia. There is moderate generalized atrophy. There is moderate chronic microvascular ischemic change. There is no significant extra-axial fluid collection. No acute intracranial findings are evident. Vascular: No hyperdense vessel or unexpected calcification. Skull: Normal. Negative for fracture or focal lesion. Sinuses/Orbits: No acute finding. Other: None. CT CERVICAL SPINE FINDINGS Alignment: Normal. Skull base and vertebrae: No acute fracture. No primary bone lesion or focal pathologic process. Soft tissues  and spinal canal: No prevertebral fluid or swelling. No visible canal hematoma. Disc levels:  Moderate cervical degenerative disc and facet changes. Upper chest: Negative. Other: None IMPRESSION: 1. No acute intracranial findings. Prior left frontal infarction with encephalomalacia. There is moderate generalized atrophy and chronic appearing white matter hypodensities which likely represent small vessel ischemic disease. 2. Negative for acute cervical spine fracture. Electronically Signed   By: Andreas Newport M.D.   On: 12/02/2016 00:37   Ct Chest W Contrast  Result Date: 12/02/2016 CLINICAL DATA:  Pain after fall EXAM: CT CHEST, ABDOMEN, AND PELVIS WITH CONTRAST TECHNIQUE: Multidetector CT imaging of the chest, abdomen and pelvis was performed following the standard protocol during bolus administration of intravenous contrast. CONTRAST:  91m ISOVUE-300 IOPAMIDOL (ISOVUE-300) INJECTION 61% COMPARISON:  Pelvic CT from 12/15/2015, chest radiograph from 11/20/2015 FINDINGS: CT CHEST FINDINGS Cardiovascular: Cardiomegaly without pericardial effusion. Three-vessel coronary arteriosclerosis is visualized. There is aortic atherosclerosis with ectasia of the ascending aorta measuring up to 3.8 cm. No dissection is noted. The patient is status post CABG. Atherosclerosis at the origins of the great vessels. Mediastinum/Nodes: No enlarged mediastinal, hilar, or axillary lymph nodes. Thyroid gland, trachea, and esophagus demonstrate no significant findings. Lungs/Pleura: There is dependent bibasilar atelectasis. No pulmonary consolidation or pneumothorax. No pleural effusion. Musculoskeletal: Rotator cuff calcific tendinopathy about the right shoulder. No acute fracture of the ribs. Chronic moderate compression of T4 without retropulsion. Mild inferior endplate compression of T7 also chronic in appearance. CT ABDOMEN PELVIS FINDINGS Hepatobiliary: Cholelithiasis without complication. Homogeneous enhancement of the liver  without biliary dilatation. Pancreas: Atrophic pancreas without ductal dilatation or mass. Spleen: No splenomegaly or mass. Adrenals/Urinary Tract: Normal bilateral adrenal glands. Variable size cysts of both kidneys some are too small to further characterize. No obstructive uropathy. Cortical scarring of left kidney. Bladder stone is noted along the dependent wall measuring 15 mm. There is mural thickening  of the bladder on the right, query inflammation or thickening due to mass effect from adjacent small pelvic side wall hemorrhage and edema. Ectasia of the left ureter without definite obstruction. Bilateral extrarenal pelves are noted. Stomach/Bowel: Stomach is within normal limits. Appendix appears normal. No evidence of bowel wall thickening, distention, or inflammatory changes. Vascular/Lymphatic: Aortic and side branch atherosclerosis. No enlarged abdominal or pelvic lymph nodes. Reproductive: Prostate is unremarkable. Other: No abdominal wall hernia or abnormality. No abdominopelvic ascites. Musculoskeletal: Coronal fracture through the acetabular roof, anteromedial wall of the acetabulum extending into the right iliac bone consistent with an anterior column fracture of the acetabulum. Associated small amount extraperitoneal hemorrhage and edema along the right obturator internus and retro pubic space of Retzius causing slight mass effect on the adjacent bladder. Chronic moderate L2 fracture. ORIF of both proximal femora. IMPRESSION: 1. New anterior column fracture of the right acetabulum with associated small amount of rightsided hemorrhage and edema in the extraperitoneal retropubic space of Retzius. 2. Cardiomegaly with 3 vessel coronary arteriosclerosis and ectasia of the ascending aorta. Aortic atherosclerosis. 3. Chronic T4 and T7 compression fractures. 4. Uncomplicated cholelithiasis. 5. Bilateral renal cysts and cortical scarring of the left kidney. 6. Nonobstructing bladder calculus. Eccentric  right-sided bladder wall thickening possibly from underdistention and mass-effect from the extraperitoneal hemorrhage fluid. Asymmetric cystitis/mucosal inflammation is not excluded. Electronically Signed   By: Ashley Royalty M.D.   On: 12/02/2016 01:25   Ct Cervical Spine Wo Contrast  Result Date: 12/02/2016 CLINICAL DATA:  [place] at home tonight. EXAM: CT HEAD WITHOUT CONTRAST CT CERVICAL SPINE WITHOUT CONTRAST TECHNIQUE: Multidetector CT imaging of the head and cervical spine was performed following the standard protocol without intravenous contrast. Multiplanar CT image reconstructions of the cervical spine were also generated. COMPARISON:  12/15/2015 FINDINGS: CT HEAD FINDINGS Brain: There is no intracranial hemorrhage, mass or evidence of acute infarction. There is prior left frontal infarction with encephalomalacia. There is moderate generalized atrophy. There is moderate chronic microvascular ischemic change. There is no significant extra-axial fluid collection. No acute intracranial findings are evident. Vascular: No hyperdense vessel or unexpected calcification. Skull: Normal. Negative for fracture or focal lesion. Sinuses/Orbits: No acute finding. Other: None. CT CERVICAL SPINE FINDINGS Alignment: Normal. Skull base and vertebrae: No acute fracture. No primary bone lesion or focal pathologic process. Soft tissues and spinal canal: No prevertebral fluid or swelling. No visible canal hematoma. Disc levels:  Moderate cervical degenerative disc and facet changes. Upper chest: Negative. Other: None IMPRESSION: 1. No acute intracranial findings. Prior left frontal infarction with encephalomalacia. There is moderate generalized atrophy and chronic appearing white matter hypodensities which likely represent small vessel ischemic disease. 2. Negative for acute cervical spine fracture. Electronically Signed   By: Andreas Newport M.D.   On: 12/02/2016 00:37   Ct Abdomen Pelvis W Contrast  Result Date:  12/02/2016 CLINICAL DATA:  Pain after fall EXAM: CT CHEST, ABDOMEN, AND PELVIS WITH CONTRAST TECHNIQUE: Multidetector CT imaging of the chest, abdomen and pelvis was performed following the standard protocol during bolus administration of intravenous contrast. CONTRAST:  18m ISOVUE-300 IOPAMIDOL (ISOVUE-300) INJECTION 61% COMPARISON:  Pelvic CT from 12/15/2015, chest radiograph from 11/20/2015 FINDINGS: CT CHEST FINDINGS Cardiovascular: Cardiomegaly without pericardial effusion. Three-vessel coronary arteriosclerosis is visualized. There is aortic atherosclerosis with ectasia of the ascending aorta measuring up to 3.8 cm. No dissection is noted. The patient is status post CABG. Atherosclerosis at the origins of the great vessels. Mediastinum/Nodes: No enlarged mediastinal, hilar, or axillary lymph nodes.  Thyroid gland, trachea, and esophagus demonstrate no significant findings. Lungs/Pleura: There is dependent bibasilar atelectasis. No pulmonary consolidation or pneumothorax. No pleural effusion. Musculoskeletal: Rotator cuff calcific tendinopathy about the right shoulder. No acute fracture of the ribs. Chronic moderate compression of T4 without retropulsion. Mild inferior endplate compression of T7 also chronic in appearance. CT ABDOMEN PELVIS FINDINGS Hepatobiliary: Cholelithiasis without complication. Homogeneous enhancement of the liver without biliary dilatation. Pancreas: Atrophic pancreas without ductal dilatation or mass. Spleen: No splenomegaly or mass. Adrenals/Urinary Tract: Normal bilateral adrenal glands. Variable size cysts of both kidneys some are too small to further characterize. No obstructive uropathy. Cortical scarring of left kidney. Bladder stone is noted along the dependent wall measuring 15 mm. There is mural thickening of the bladder on the right, query inflammation or thickening due to mass effect from adjacent small pelvic side wall hemorrhage and edema. Ectasia of the left ureter without  definite obstruction. Bilateral extrarenal pelves are noted. Stomach/Bowel: Stomach is within normal limits. Appendix appears normal. No evidence of bowel wall thickening, distention, or inflammatory changes. Vascular/Lymphatic: Aortic and side branch atherosclerosis. No enlarged abdominal or pelvic lymph nodes. Reproductive: Prostate is unremarkable. Other: No abdominal wall hernia or abnormality. No abdominopelvic ascites. Musculoskeletal: Coronal fracture through the acetabular roof, anteromedial wall of the acetabulum extending into the right iliac bone consistent with an anterior column fracture of the acetabulum. Associated small amount extraperitoneal hemorrhage and edema along the right obturator internus and retro pubic space of Retzius causing slight mass effect on the adjacent bladder. Chronic moderate L2 fracture. ORIF of both proximal femora. IMPRESSION: 1. New anterior column fracture of the right acetabulum with associated small amount of rightsided hemorrhage and edema in the extraperitoneal retropubic space of Retzius. 2. Cardiomegaly with 3 vessel coronary arteriosclerosis and ectasia of the ascending aorta. Aortic atherosclerosis. 3. Chronic T4 and T7 compression fractures. 4. Uncomplicated cholelithiasis. 5. Bilateral renal cysts and cortical scarring of the left kidney. 6. Nonobstructing bladder calculus. Eccentric right-sided bladder wall thickening possibly from underdistention and mass-effect from the extraperitoneal hemorrhage fluid. Asymmetric cystitis/mucosal inflammation is not excluded. Electronically Signed   By: Ashley Royalty M.D.   On: 12/02/2016 01:25   Dg Hip Unilat W Or Wo Pelvis 2-3 Views Right  Result Date: 12/01/2016 CLINICAL DATA:  Persistent pain after ground level fall at home tonight at 19:40 EXAM: DG HIP (WITH OR WITHOUT PELVIS) 2-3V RIGHT COMPARISON:  12/18/2015 FINDINGS: Four parallel fixation screws appear unchanged in the right hip. Unchanged intramedullary left  femoral nail with interlocking dynamic hip screw. No evidence of hardware failure. No evidence of acute fracture. No dislocation. The bones are osteopenic, decreasing sensitivity for nondisplaced fractures. IMPRESSION: Negative for acute fracture or dislocation. Fixation hardware appears intact. Electronically Signed   By: Andreas Newport M.D.   On: 12/01/2016 22:38    ROS  Blood pressure 131/86, pulse (!) 36, temperature 97.4 F (36.3 C), temperature source Oral, resp. rate (!) 26, height '5\' 6"'  (1.676 m), weight 68 kg (150 lb), SpO2 96 %. Physical Exam  Constitutional: He appears well-developed. No distress.  HENT:  Head: Normocephalic.  Right Ear: Hearing, external ear and ear canal normal.  Left Ear: Hearing, external ear and ear canal normal.  Nose: No sinus tenderness.  Mouth/Throat: Uvula is midline and oropharynx is clear and moist.  Cerumen B external auditory canals  Eyes: EOM are normal. Pupils are equal, round, and reactive to light.  Neck: No tracheal deviation present. No thyromegaly present.  No posterior midline  tenderness  Cardiovascular:  Irregularly irregular rate 110  Respiratory: Effort normal and breath sounds normal. No stridor. No respiratory distress. He has no wheezes. He has no rales.  GI: Soft. He exhibits no distension. There is no tenderness. There is no rebound and no guarding.  Musculoskeletal:  Tender around right hip, tender deformity right distal radius  Neurological: He is alert. He displays no tremor. He exhibits normal muscle tone. He displays no seizure activity. GCS eye subscore is 4. GCS verbal subscore is 5. GCS motor subscore is 6.  Some confused speech, moving extremities to command  Skin: Skin is warm.  Psychiatric: He has a normal mood and affect.     Assessment/Plan Fall Right distal radius and ulnar styloid fracture - being splinted in ED, Dr. Lorin Mercy to consult Right acetabular fracture with small associated hematoma in the pelvis -  Dr. Lorin Mercy to consult Hold Eliquis DNR/no surgery per family  Admit to [place], [place] 12/02/2016, 2:33 AM

## 2016-12-02 NOTE — Progress Notes (Signed)
Unable to complete admission as patient is confused and no family present.

## 2016-12-02 NOTE — [place] FL2 (Signed)
[place] MEDICAID FL2 LEVEL OF CARE SCREENING TOOL     IDENTIFICATION  Patient Name: Jacob [name] Birthdate: [birth year] Sex: male Admission Date (Current Location): 12/01/2016  [facility] and [place] Number:  [employer] and Address:  [facility] [place], [address] [place], [place] [address]      Provider Number: [id number]  Attending Physician Name and Address:  Trauma Md, MD  Relative Name and Phone Number:       Current Level of Care: Hospital Recommended Level of Care: Skilled Nursing Facility Prior Approval Number:    Date Approved/Denied:   PASRR Number: [id number]  Discharge Plan: SNF    Current Diagnoses: Patient Active Problem List   Diagnosis Date Noted  . Acetabular fracture (HCC) 12/02/2016  . Distal radius fracture, right 12/02/2016  . Fall   . Urge urinary incontinence 10/06/2016  . Visual hallucinations 08/30/2016  . History of cardioembolic cerebrovascular accident (CVA) 10/28/2015  . Anemia 09/24/2015  . Nocturia associated with benign prostatic hyperplasia 02/24/2015  . Hypothyroidism 08/02/2014  . Dysphagia 08/02/2014  . Essential hypertension 05/23/2013  . CAD in native artery -->  CABG x4 in 1994   . 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 Dyslipidemia, goal LDL below 70 - on statin, followed by primary physician.   . Chronic atrial fibrillation (HCC) 12/15/2012    Class: Chronic    Orientation RESPIRATION BLADDER Height & Weight     Self  Normal Incontinent Weight: 150 lb (68 kg) Height:   (167.6 cm)  BEHAVIORAL SYMPTOMS/MOOD NEUROLOGICAL BOWEL NUTRITION STATUS      Continent Diet (see DC summary)  AMBULATORY STATUS COMMUNICATION OF NEEDS Skin   Extensive Assist Verbally PU Stage and Appropriate Care   PU Stage 2 Dressing:  (located on buttocks, foam dressing)                   Personal Care Assistance Level of Assistance  Bathing, Dressing Bathing  Assistance: Maximum assistance   Dressing Assistance: Maximum assistance     Functional Limitations Info             SPECIAL CARE FACTORS FREQUENCY  PT (By licensed PT), OT (By licensed OT)     PT Frequency: 5/wk OT Frequency: 5/wk            Contractures      Additional Factors Info  Code Status, Allergies, Psychotropic, Isolation Precautions Code Status Info: DNR Allergies Info: NKA Psychotropic Info: ativan   Isolation Precautions Info: ESBL     Current Medications (12/02/2016):  This is the current hospital active medication list Current Facility-Administered Medications  Medication Dose Route Frequency Provider Last Rate Last Dose  . 0.9 % NaCl with KCl 20 mEq/ L  infusion   Intravenous Continuous Violeta Gelinas, MD 50 mL/hr at 12/02/16 0554    . acetaminophen (TYLENOL) tablet 650 mg  650 mg Oral Q4H PRN Violeta Gelinas, MD   650 mg at 12/02/16 1312  . dorzolamide (TRUSOPT) 2 % ophthalmic solution 1 drop  1 drop Both Eyes BID Violeta Gelinas, MD   1 drop at 12/02/16 1105  . fentaNYL (SUBLIMAZE) injection 50 mcg  50 mcg Intravenous Q2H PRN Tilden Fossa, MD   50 mcg at 12/02/16 0442  . finasteride (PROSCAR) tablet 5 mg  5 mg Oral Daily Violeta Gelinas, MD   5 mg at 12/02/16 0856  . latanoprost (XALATAN)  0.005 % ophthalmic solution 1 drop  1 drop Both Eyes QHS Violeta Gelinas, MD      . levothyroxine (SYNTHROID, LEVOTHROID) tablet 88 mcg  88 mcg Oral QAC breakfast Violeta Gelinas, MD   88 mcg at 12/02/16 0856  . LORazepam (ATIVAN) tablet 1 mg  1 mg Oral QHS Violeta Gelinas, MD      . MEDLINE mouth rinse  15 mL Mouth Rinse BID Violeta Gelinas, MD      . mirabegron ER [facility]) tablet 25 mg  25 mg Oral Daily Violeta Gelinas, MD   25 mg at 12/02/16 0856  . morphine 2 MG/ML injection 2-4 mg  2-4 mg Intravenous Q2H PRN Violeta Gelinas, MD      . ondansetron [facility]) tablet 4 mg  4 mg Oral Q6H PRN Violeta Gelinas, MD       Or  . ondansetron [facility]) injection 4 mg  4 mg  Intravenous Q6H PRN Violeta Gelinas, MD      . pantoprazole (PROTONIX) EC tablet 40 mg  40 mg Oral Daily Violeta Gelinas, MD   40 mg at 12/02/16 0856   Or  . pantoprazole (PROTONIX) injection 40 mg  40 mg Intravenous Daily Violeta Gelinas, MD      . simvastatin (ZOCOR) tablet 20 mg  20 mg Oral q1800 Violeta Gelinas, MD      . traMADol Janean Sark) tablet 50 mg  50 mg Oral Q6H PRN Violeta Gelinas, MD         Discharge Medications: Please see discharge summary for a list of discharge medications.  Relevant Imaging Results:  Relevant Lab Results:   Additional Information SS # [id number]  Burna Sis, LCSW

## 2016-12-02 NOTE — Consult Note (Signed)
Reason for Consult: Right anterior column acetabular fracture Referring Physician: Georganna Skeans M.D. trauma service/Stephen Reading core M.D./ER  Jacob [name] is an 83 y.o. male.  HPI: 83 year old male presented to the emergency room after a fall. CT scan showed a right anterior column acetabular fracture without dislocation of the hip. Plain radiographs did not show the acetabular fracture well. He has osteopenia. Complex history with old right MCA stroke. History C4 fracture. He's had coronary artery disease, chronic atrial fibrillation, hypertension, glaucoma. Previous bilateral hip fractures with surgical stabilization. History is kidney stones, CABG 4 history kidney stones and dementia. Patient is a poor historian confused and history is to be obtained from chart review.  Review of systems is not able be obtained from this confused patient post CVA and also dementia. Review of systems is obtained from chart review and previous provider notes over the last year. Extensive multisystem problems as listed above. Family is not wishing any operative intervention.  Past Medical History:  Diagnosis Date  . Acute ischemic left MCA stroke ([place]) 10/2015  . Acute right MCA stroke ([place]) 09/23/2015  . C4 cervical fracture ([place]) 2015   with spinal cord contusion - stopped coumadin.  Marland Kitchen CAD in native artery -->  1994   referred for CABG  . Chronic atrial fibrillation ([place]) 12/15/2012  . Diverticulitis   . Dyslipidemia, goal LDL below 70[272.4]     - on statin, followed by primary physician.   . Essential hypertension 05/23/2013  . Glaucoma   . H/O seasonal allergies   . Hip fracture ([place]) 12/15/2015  . History of cardioembolic cerebrovascular accident (CVA) 10/28/2015  . History of kidney stones   . Long term (current) use of anticoagulants 12/15/2012   warfarin  . S/P CABG x 4 1994   a) 1994: LIMA-LAD, SVG-RI, SVG-OM, SVG-RCA; b) 09/2006, Persantine Myoview: fixed inferior defect consistent with  infarct/scar without peri-infarct ischemia;; 2-D echo: Mild-mod concentric LVH. Normal EF.  Abnormal relaxation, aortic sclerosis with mild-mod AI    Past Surgical History:  Procedure Laterality Date  . APPENDECTOMY    . CORONARY ARTERY BYPASS GRAFT  1994   LIMA-LAD, SVG-RI, SVG-OM, SVG-RCA   . FEMUR SURGERY Left 2000   replacement- due to fall  . HAND SURGERY Left 1985  . HIP PINNING,CANNULATED Right 12/16/2015   Procedure: CANNULATED HIP PINNING;  Surgeon: Earnestine Leys, MD;  Location: [facility] ORS;  Service: Orthopedics;  Laterality: Right;  . Persantine Myoview  February 2008   Fixed inferior wall defect-consistent with infarct/scar without peri-infarct ischemia  . TRANSTHORACIC ECHOCARDIOGRAM  February 2008   Mild/mod conc LVH, Nl Size & Fxn, Gr 1 DD(abnormal relaxation), mild to moderate aortic regurgitation and mildly sclerotic aortic valve.    Family History  Problem Relation Age of Onset  . Liver cancer Mother   . Prostate cancer Father     Social History:  reports that he has never smoked. He has never used smokeless tobacco. He reports that he does not drink alcohol or use drugs.  Allergies: No Known Allergies  Medications: I have reviewed the patient's current medications.  Results for orders placed or performed during the hospital encounter of 12/01/16 (from the past 48 hour(s))  Comprehensive metabolic panel     Status: Abnormal   Collection Time: 12/01/16 10:23 PM  Result Value Ref Range   Sodium 137 135 - 145 mmol/L   Potassium 3.8 3.5 - 5.1 mmol/L   Chloride 103 101 - 111 mmol/L   CO2 24  22 - 32 mmol/L   Glucose, Bld 140 (H) 65 - 99 mg/dL   BUN 26 (H) 6 - 20 mg/dL   Creatinine, Ser 1.40 (H) 0.61 - 1.24 mg/dL   Calcium 9.0 8.9 - 10.3 mg/dL   Total Protein 7.4 6.5 - 8.1 g/dL   Albumin 3.4 (L) 3.5 - 5.0 g/dL   AST 29 15 - 41 U/L   ALT 22 17 - 63 U/L   Alkaline Phosphatase 88 38 - 126 U/L   Total Bilirubin 0.6 0.3 - 1.2 mg/dL   GFR calc non Af Amer 40 (L) >60  mL/min   GFR calc Af Amer 47 (L) >60 mL/min    Comment: (NOTE) The eGFR has been calculated using the CKD EPI equation. This calculation has not been validated in all clinical situations. eGFR's persistently <60 mL/min signify possible Chronic Kidney Disease.    Anion gap 10 5 - 15  CBC with Differential     Status: Abnormal   Collection Time: 12/01/16 10:23 PM  Result Value Ref Range   WBC 13.9 (H) 4.0 - 10.5 K/uL   RBC 3.34 (L) 4.22 - 5.81 MIL/uL   Hemoglobin 10.7 (L) 13.0 - 17.0 g/dL   HCT 31.5 (L) 39.0 - 52.0 %   MCV 94.3 78.0 - 100.0 fL   MCH 32.0 26.0 - 34.0 pg   MCHC 34.0 30.0 - 36.0 g/dL   RDW 14.0 11.5 - 15.5 %   Platelets 229 150 - 400 K/uL   Neutrophils Relative % 93 %   Lymphocytes Relative 6 %   Monocytes Relative 1 %   Eosinophils Relative 0 %   Basophils Relative 0 %   Neutro Abs 13.0 (H) 1.7 - 7.7 K/uL   Lymphs Abs 0.8 0.7 - 4.0 K/uL   Monocytes Absolute 0.1 0.1 - 1.0 K/uL   Eosinophils Absolute 0.0 0.0 - 0.7 K/uL   Basophils Absolute 0.0 0.0 - 0.1 K/uL   Smear Review MORPHOLOGY UNREMARKABLE   Urinalysis, Routine w reflex microscopic     Status: Abnormal   Collection Time: 12/01/16 10:23 PM  Result Value Ref Range   Color, Urine YELLOW YELLOW   APPearance CLEAR CLEAR   Specific Gravity, Urine 1.017 1.005 - 1.030   pH 5.0 5.0 - 8.0   Glucose, UA NEGATIVE NEGATIVE mg/dL   Hgb urine dipstick MODERATE (A) NEGATIVE   Bilirubin Urine NEGATIVE NEGATIVE   Ketones, ur NEGATIVE NEGATIVE mg/dL   Protein, ur 30 (A) NEGATIVE mg/dL   Nitrite NEGATIVE NEGATIVE   Leukocytes, UA NEGATIVE NEGATIVE   RBC / HPF 0-5 0 - 5 RBC/hpf   WBC, UA 0-5 0 - 5 WBC/hpf   Bacteria, UA RARE (A) NONE SEEN   Squamous Epithelial / LPF NONE SEEN NONE SEEN   Mucous PRESENT   I-stat Chem 8, ED     Status: Abnormal   Collection Time: 12/01/16 10:37 PM  Result Value Ref Range   Sodium 139 135 - 145 mmol/L   Potassium 3.8 3.5 - 5.1 mmol/L   Chloride 105 101 - 111 mmol/L   BUN 29 (H) 6 -  20 mg/dL   Creatinine, Ser 1.40 (H) 0.61 - 1.24 mg/dL   Glucose, Bld 141 (H) 65 - 99 mg/dL   Calcium, Ion 1.14 (L) 1.15 - 1.40 mmol/L   TCO2 24 0 - 100 mmol/L   Hemoglobin 11.2 (L) 13.0 - 17.0 g/dL   HCT 33.0 (L) 39.0 - 52.0 %  CBC     Status:  Abnormal   Collection Time: 12/02/16  7:14 AM  Result Value Ref Range   WBC 16.3 (H) 4.0 - 10.5 K/uL   RBC 2.85 (L) 4.22 - 5.81 MIL/uL   Hemoglobin 9.0 (L) 13.0 - 17.0 g/dL   HCT 26.8 (L) 39.0 - 52.0 %   MCV 94.0 78.0 - 100.0 fL   MCH 31.6 26.0 - 34.0 pg   MCHC 33.6 30.0 - 36.0 g/dL   RDW 13.8 11.5 - 15.5 %   Platelets 163 150 - 400 K/uL    Dg Chest 1 View  Result Date: 12/01/2016 CLINICAL DATA:  Ground level fall at home tonight, at 19:40. EXAM: CHEST 1 VIEW COMPARISON:  11/20/2015 FINDINGS: Stable moderate cardiomegaly. Prior sternotomy and CABG. The lungs are clear. The pulmonary vasculature is normal. There is no pneumothorax. No effusion. Hilar and mediastinal contours are unremarkable and unchanged. The bones appear osteopenic, but no displaced fractures are evident. Stable healed fracture deformity of the left fifth rib. IMPRESSION: No acute findings. Electronically Signed   By: Andreas Newport M.D.   On: 12/01/2016 22:36   Dg Wrist Complete Right  Result Date: 12/02/2016 CLINICAL DATA:  [place] at home tonight EXAM: RIGHT WRIST - COMPLETE 3+ VIEW COMPARISON:  None. FINDINGS: There is a transverse fracture of the distal radius, intra-articular at the ulnar aspect of the radiocarpal articulation. There is a transverse fracture of the ulnar styloid. No dislocation. IMPRESSION: Fractures of the distal radius and ulnar styloid. Electronically Signed   By: Andreas Newport M.D.   On: 12/02/2016 00:56   Ct Head Wo Contrast  Result Date: 12/02/2016 CLINICAL DATA:  [place] at home tonight. EXAM: CT HEAD WITHOUT CONTRAST CT CERVICAL SPINE WITHOUT CONTRAST TECHNIQUE: Multidetector CT imaging of the head and cervical spine was performed following the  standard protocol without intravenous contrast. Multiplanar CT image reconstructions of the cervical spine were also generated. COMPARISON:  12/15/2015 FINDINGS: CT HEAD FINDINGS Brain: There is no intracranial hemorrhage, mass or evidence of acute infarction. There is prior left frontal infarction with encephalomalacia. There is moderate generalized atrophy. There is moderate chronic microvascular ischemic change. There is no significant extra-axial fluid collection. No acute intracranial findings are evident. Vascular: No hyperdense vessel or unexpected calcification. Skull: Normal. Negative for fracture or focal lesion. Sinuses/Orbits: No acute finding. Other: None. CT CERVICAL SPINE FINDINGS Alignment: Normal. Skull base and vertebrae: No acute fracture. No primary bone lesion or focal pathologic process. Soft tissues and spinal canal: No prevertebral fluid or swelling. No visible canal hematoma. Disc levels:  Moderate cervical degenerative disc and facet changes. Upper chest: Negative. Other: None IMPRESSION: 1. No acute intracranial findings. Prior left frontal infarction with encephalomalacia. There is moderate generalized atrophy and chronic appearing white matter hypodensities which likely represent small vessel ischemic disease. 2. Negative for acute cervical spine fracture. Electronically Signed   By: Andreas Newport M.D.   On: 12/02/2016 00:37   Ct Chest W Contrast  Result Date: 12/02/2016 CLINICAL DATA:  Pain after fall EXAM: CT CHEST, ABDOMEN, AND PELVIS WITH CONTRAST TECHNIQUE: Multidetector CT imaging of the chest, abdomen and pelvis was performed following the standard protocol during bolus administration of intravenous contrast. CONTRAST:  77m ISOVUE-300 IOPAMIDOL (ISOVUE-300) INJECTION 61% COMPARISON:  Pelvic CT from 12/15/2015, chest radiograph from 11/20/2015 FINDINGS: CT CHEST FINDINGS Cardiovascular: Cardiomegaly without pericardial effusion. Three-vessel coronary arteriosclerosis is  visualized. There is aortic atherosclerosis with ectasia of the ascending aorta measuring up to 3.8 cm. No dissection is noted. The patient is  status post CABG. Atherosclerosis at the origins of the great vessels. Mediastinum/Nodes: No enlarged mediastinal, hilar, or axillary lymph nodes. Thyroid gland, trachea, and esophagus demonstrate no significant findings. Lungs/Pleura: There is dependent bibasilar atelectasis. No pulmonary consolidation or pneumothorax. No pleural effusion. Musculoskeletal: Rotator cuff calcific tendinopathy about the right shoulder. No acute fracture of the ribs. Chronic moderate compression of T4 without retropulsion. Mild inferior endplate compression of T7 also chronic in appearance. CT ABDOMEN PELVIS FINDINGS Hepatobiliary: Cholelithiasis without complication. Homogeneous enhancement of the liver without biliary dilatation. Pancreas: Atrophic pancreas without ductal dilatation or mass. Spleen: No splenomegaly or mass. Adrenals/Urinary Tract: Normal bilateral adrenal glands. Variable size cysts of both kidneys some are too small to further characterize. No obstructive uropathy. Cortical scarring of left kidney. Bladder stone is noted along the dependent wall measuring 15 mm. There is mural thickening of the bladder on the right, query inflammation or thickening due to mass effect from adjacent small pelvic side wall hemorrhage and edema. Ectasia of the left ureter without definite obstruction. Bilateral extrarenal pelves are noted. Stomach/Bowel: Stomach is within normal limits. Appendix appears normal. No evidence of bowel wall thickening, distention, or inflammatory changes. Vascular/Lymphatic: Aortic and side branch atherosclerosis. No enlarged abdominal or pelvic lymph nodes. Reproductive: Prostate is unremarkable. Other: No abdominal wall hernia or abnormality. No abdominopelvic ascites. Musculoskeletal: Coronal fracture through the acetabular roof, anteromedial wall of the  acetabulum extending into the right iliac bone consistent with an anterior column fracture of the acetabulum. Associated small amount extraperitoneal hemorrhage and edema along the right obturator internus and retro pubic space of Retzius causing slight mass effect on the adjacent bladder. Chronic moderate L2 fracture. ORIF of both proximal femora. IMPRESSION: 1. New anterior column fracture of the right acetabulum with associated small amount of rightsided hemorrhage and edema in the extraperitoneal retropubic space of Retzius. 2. Cardiomegaly with 3 vessel coronary arteriosclerosis and ectasia of the ascending aorta. Aortic atherosclerosis. 3. Chronic T4 and T7 compression fractures. 4. Uncomplicated cholelithiasis. 5. Bilateral renal cysts and cortical scarring of the left kidney. 6. Nonobstructing bladder calculus. Eccentric right-sided bladder wall thickening possibly from underdistention and mass-effect from the extraperitoneal hemorrhage fluid. Asymmetric cystitis/mucosal inflammation is not excluded. Electronically Signed   By: Ashley Royalty M.D.   On: 12/02/2016 01:25   Ct Cervical Spine Wo Contrast  Result Date: 12/02/2016 CLINICAL DATA:  [place] at home tonight. EXAM: CT HEAD WITHOUT CONTRAST CT CERVICAL SPINE WITHOUT CONTRAST TECHNIQUE: Multidetector CT imaging of the head and cervical spine was performed following the standard protocol without intravenous contrast. Multiplanar CT image reconstructions of the cervical spine were also generated. COMPARISON:  12/15/2015 FINDINGS: CT HEAD FINDINGS Brain: There is no intracranial hemorrhage, mass or evidence of acute infarction. There is prior left frontal infarction with encephalomalacia. There is moderate generalized atrophy. There is moderate chronic microvascular ischemic change. There is no significant extra-axial fluid collection. No acute intracranial findings are evident. Vascular: No hyperdense vessel or unexpected calcification. Skull: Normal.  Negative for fracture or focal lesion. Sinuses/Orbits: No acute finding. Other: None. CT CERVICAL SPINE FINDINGS Alignment: Normal. Skull base and vertebrae: No acute fracture. No primary bone lesion or focal pathologic process. Soft tissues and spinal canal: No prevertebral fluid or swelling. No visible canal hematoma. Disc levels:  Moderate cervical degenerative disc and facet changes. Upper chest: Negative. Other: None IMPRESSION: 1. No acute intracranial findings. Prior left frontal infarction with encephalomalacia. There is moderate generalized atrophy and chronic appearing white matter hypodensities which likely represent  small vessel ischemic disease. 2. Negative for acute cervical spine fracture. Electronically Signed   By: Andreas Newport M.D.   On: 12/02/2016 00:37   Ct Abdomen Pelvis W Contrast  Result Date: 12/02/2016 CLINICAL DATA:  Pain after fall EXAM: CT CHEST, ABDOMEN, AND PELVIS WITH CONTRAST TECHNIQUE: Multidetector CT imaging of the chest, abdomen and pelvis was performed following the standard protocol during bolus administration of intravenous contrast. CONTRAST:  70m ISOVUE-300 IOPAMIDOL (ISOVUE-300) INJECTION 61% COMPARISON:  Pelvic CT from 12/15/2015, chest radiograph from 11/20/2015 FINDINGS: CT CHEST FINDINGS Cardiovascular: Cardiomegaly without pericardial effusion. Three-vessel coronary arteriosclerosis is visualized. There is aortic atherosclerosis with ectasia of the ascending aorta measuring up to 3.8 cm. No dissection is noted. The patient is status post CABG. Atherosclerosis at the origins of the great vessels. Mediastinum/Nodes: No enlarged mediastinal, hilar, or axillary lymph nodes. Thyroid gland, trachea, and esophagus demonstrate no significant findings. Lungs/Pleura: There is dependent bibasilar atelectasis. No pulmonary consolidation or pneumothorax. No pleural effusion. Musculoskeletal: Rotator cuff calcific tendinopathy about the right shoulder. No acute fracture of  the ribs. Chronic moderate compression of T4 without retropulsion. Mild inferior endplate compression of T7 also chronic in appearance. CT ABDOMEN PELVIS FINDINGS Hepatobiliary: Cholelithiasis without complication. Homogeneous enhancement of the liver without biliary dilatation. Pancreas: Atrophic pancreas without ductal dilatation or mass. Spleen: No splenomegaly or mass. Adrenals/Urinary Tract: Normal bilateral adrenal glands. Variable size cysts of both kidneys some are too small to further characterize. No obstructive uropathy. Cortical scarring of left kidney. Bladder stone is noted along the dependent wall measuring 15 mm. There is mural thickening of the bladder on the right, query inflammation or thickening due to mass effect from adjacent small pelvic side wall hemorrhage and edema. Ectasia of the left ureter without definite obstruction. Bilateral extrarenal pelves are noted. Stomach/Bowel: Stomach is within normal limits. Appendix appears normal. No evidence of bowel wall thickening, distention, or inflammatory changes. Vascular/Lymphatic: Aortic and side branch atherosclerosis. No enlarged abdominal or pelvic lymph nodes. Reproductive: Prostate is unremarkable. Other: No abdominal wall hernia or abnormality. No abdominopelvic ascites. Musculoskeletal: Coronal fracture through the acetabular roof, anteromedial wall of the acetabulum extending into the right iliac bone consistent with an anterior column fracture of the acetabulum. Associated small amount extraperitoneal hemorrhage and edema along the right obturator internus and retro pubic space of Retzius causing slight mass effect on the adjacent bladder. Chronic moderate L2 fracture. ORIF of both proximal femora. IMPRESSION: 1. New anterior column fracture of the right acetabulum with associated small amount of rightsided hemorrhage and edema in the extraperitoneal retropubic space of Retzius. 2. Cardiomegaly with 3 vessel coronary arteriosclerosis and  ectasia of the ascending aorta. Aortic atherosclerosis. 3. Chronic T4 and T7 compression fractures. 4. Uncomplicated cholelithiasis. 5. Bilateral renal cysts and cortical scarring of the left kidney. 6. Nonobstructing bladder calculus. Eccentric right-sided bladder wall thickening possibly from underdistention and mass-effect from the extraperitoneal hemorrhage fluid. Asymmetric cystitis/mucosal inflammation is not excluded. Electronically Signed   By: DAshley RoyaltyM.D.   On: 12/02/2016 01:25   Dg Hip Unilat W Or Wo Pelvis 2-3 Views Right  Result Date: 12/01/2016 CLINICAL DATA:  Persistent pain after ground level fall at home tonight at 19:40 EXAM: DG HIP (WITH OR WITHOUT PELVIS) 2-3V RIGHT COMPARISON:  12/18/2015 FINDINGS: Four parallel fixation screws appear unchanged in the right hip. Unchanged intramedullary left femoral nail with interlocking dynamic hip screw. No evidence of hardware failure. No evidence of acute fracture. No dislocation. The bones are osteopenic, decreasing sensitivity  for nondisplaced fractures. IMPRESSION: Negative for acute fracture or dislocation. Fixation hardware appears intact. Electronically Signed   By: Andreas Newport M.D.   On: 12/01/2016 22:38    ROS Blood pressure (!) 120/55, pulse 90, temperature 98.7 F (37.1 C), temperature source Axillary, resp. rate 20, height '5\' 6"'  (1.676 m), weight 150 lb (68 kg), SpO2 95 %. Physical Exam  Constitutional:  Alert talkative confused and talking in Spanish. He verbally denied pain to me.  HENT:  Head: Normocephalic and atraumatic.  Eyes: Pupils are equal, round, and reactive to light.  Neck: Neck supple. No tracheal deviation present. No thyromegaly present.  Respiratory: Effort normal. He has no wheezes. He has no rales.  GI: Soft. He exhibits distension.  Musculoskeletal: He exhibits tenderness. He exhibits no edema or deformity.  Right anterior column acetabular fracture is a closed injury. He recognizes sensation to  his foot. He can move his toes and ankles.  Neurological:  Confused talkative. He moves his upper extremities and uses his hands when he talks.  Skin: Skin is warm and dry.  Psychiatric:  Confused talking in [place]. Patient is very vocal.    Assessment/Plan: Right anterior column fracture without significant displacement. Plan will be waiting 2 days and then begin bed to recliner transfers. He'll need to be nonweightbearing on the right hip and abdomen x-ray repeated in one week. I'll be glad to follow him and nonoperative treatment will be followed.  Marybelle Killings 12/02/2016, 12:35 PM

## 2016-12-02 NOTE — Clinical Social Work Note (Signed)
Clinical Social Work Assessment  Patient Details  Name: Jacob [name] MRN: [id number] Date of Birth: Jan 26, 1917  Date of referral:  12/02/16               Reason for consult:  Facility Placement                Permission sought to share information with:  Oceanographer granted to share information::  Yes, Verbal Permission Granted  Name::     TEFL teacher::  SNF  Relationship::  dtr  Contact Information:     Housing/Transportation Living arrangements for the past 2 months:  Single Family Home Source of Information:  Adult Children Patient Interpreter Needed:  None Criminal Activity/Legal Involvement Pertinent to Current Situation/Hospitalization:  No - Comment as needed Significant Relationships:  Adult Children, Spouse Lives with:  Spouse Do you feel safe going back to the place where you live?  No Need for family participation in patient care:  Yes (Comment) (decision making)  Care giving concerns:  Pt lives at home with spouse- per pt dtr pt care has become too much for spouse at home.  Now with pt fractures after fall spouse would be unable to care for pt needs at this time.   Social Worker assessment / plan:  CSW spoke with pt dtr concerning likely need for SNF at time of DC.  Dtr reports pt has been to SNF twice in the past after fractures and they are familiar with the process.  Dtr also has concerns for pt care after rehab- worried about pt returning home and believes pt might need to transition for long term care at SNF- states she discussed this with [facility] SN[facility] when pt was there in the past.  Her concerns are that facility would kick pt out once insurance stopped paying.  CSW helped to investigate this for family- spoke with [facility] who states they can assist with medicaid application and would be flexible with pt stay after insurance stops covering as long as Medicaid had been applied for.  Employment status:  Retired Automotive engineer PT Recommendations:  Not assessed at this time Information / Referral to community resources:  Skilled Nursing Facility  Patient/Family's Response to care:  Pt dtr is agreeable to SNF and relieved that she will get help in planning pt longer term plan.  Patient/Family's Understanding of and Emotional Response to Diagnosis, Current Treatment, and Prognosis:  Pt dtr very involved in care and has good understanding- hopeful pt will recover but realistic about his long term needs at this time.  Emotional Assessment Appearance:  Appears stated age Attitude/Demeanor/Rapport:  Unable to Assess Affect (typically observed):  Unable to Assess Orientation:  Oriented to Self Alcohol / Substance use:  Not Applicable Psych involvement (Current and /or in the community):  No (Comment)  Discharge Needs  Concerns to be addressed:  Care Coordination, Home Safety Concerns Readmission within the last 30 days:  No Current discharge risk:  Physical Impairment Barriers to Discharge:  Continued Medical Work up   Burna Sis, LCSW 12/02/2016, 3:30 PM

## 2016-12-02 NOTE — Progress Notes (Signed)
Patient ID: Jacob [name], male   DOB: Dec 21, 1916, 83 y.o.   MRN: [id number] Dr. Delford Field asked me to review and provide care for the right distal radius fracture which is impacted with also ulnar styloid fracture. There is no significant angulation or shortening of the radius. He was placed in a splint in the emergency room. Will treat this conservatively in a splint and then proceed with cast immobilization after week when the swelling is decreased.

## 2016-12-02 NOTE — ED Provider Notes (Signed)
Signout from Dr. Madilyn Hook. Patient has dementiat with mechanical fall sustaining a right wrist fracture and possible pelvic fracture. He is DO NOT RESUSCITATE. He is on Eliquis for history of atrial fibrillation. His family has made it clear that they do not want him to have any kind of surgery.  Radius fracture splinted. Radial pulse intact. CT imaging remarkable for right anterior column acetabular fracture. Discussed with Dr. Ophelia Charter who will consult on patient.  D/w Dr. Janee Morn of trauma surgery who will evaluate.  Vital stable. Will not reverse anticoagulation at this time. Discussed with Dr. Janee Morn.  CRITICAL CARE Performed by: Glynn Octave Total critical care time: 35 minutes Critical care time was exclusive of separately billable procedures and treating other patients. Critical care was necessary to treat or prevent imminent or life-threatening deterioration. Critical care was time spent personally by me on the following activities: development of treatment plan with patient and/or surrogate as well as nursing, discussions with consultants, evaluation of patient's response to treatment, examination of patient, obtaining history from patient or surrogate, ordering and performing treatments and interventions, ordering and review of laboratory studies, ordering and review of radiographic studies, pulse oximetry and re-evaluation of patient's condition.    Glynn Octave, MD 12/02/16 [phone]

## 2016-12-02 NOTE — Progress Notes (Signed)
Patient bladder scanned 70ml. Patient has not voided since 0900. Will continue to monitor. Dr. Luisa Hart verbal order to in and out cath if bladder scan resulted >235ml.

## 2016-12-02 NOTE — Care Management Note (Signed)
Case Management Note  Patient Details  Name: ETHEL VERONICA MRN: [id number] Date of Birth: 01/02/1917  Subjective/Objective:   Pt admitted on 12/02/16 s/p fall with Rt acetabular fracture and a Rt radius fracture.  PTA, pt resided at home with wife and daughter/son in [place].                  Action/Plan: Met with pt's family at bedside.  Spoke with daughter, Inez Catalina. She is awaiting discussion with CSW to determine long term options for pt, as she states they will not be able to take pt home.  Pt has been at [facility] in the past, and family open to him going back there.  CSW has been consulted.    Expected Discharge Date:                  Expected Discharge Plan:  Skilled Nursing Facility  In-House Referral:  Clinical Social Work  Discharge planning Services  CM Consult  Post Acute Care Choice:    Choice offered to:     DME Arranged:    DME Agency:     HH Arranged:    [place] Agency:     Status of Service:  In process, will continue to follow  If discussed at Long Length of Stay Meetings, dates discussed:    Additional Comments:  Reinaldo Raddle, RN, BSN  Trauma/Neuro ICU Case Manager [phone]

## 2016-12-02 NOTE — ED Notes (Signed)
Ortho at bedside.

## 2016-12-02 NOTE — ED Notes (Signed)
The ortho tech  Has placed a sugar tong splint on the rt arm

## 2016-12-03 LAB — BASIC METABOLIC PANEL
ANION GAP: 11 (ref 5–15)
BUN: 44 mg/dL — ABNORMAL HIGH (ref 6–20)
CO2: 21 mmol/L — ABNORMAL LOW (ref 22–32)
Calcium: 8.9 mg/dL (ref 8.9–10.3)
Chloride: 107 mmol/L (ref 101–111)
Creatinine, Ser: 1.71 mg/dL — ABNORMAL HIGH (ref 0.61–1.24)
GFR, EST AFRICAN AMERICAN: 37 mL/min — AB (ref >60)
GFR, EST NON AFRICAN AMERICAN: 32 mL/min — AB (ref >60)
GLUCOSE: 103 mg/dL — AB (ref 65–99)
POTASSIUM: 4.1 mmol/L (ref 3.5–5.1)
Sodium: 139 mmol/L (ref 135–145)

## 2016-12-03 LAB — CBC
HCT: 24.3 % — ABNORMAL LOW (ref 39.0–52.0)
Hemoglobin: 8.1 g/dL — ABNORMAL LOW (ref 13.0–17.0)
MCH: 30.9 pg (ref 26.0–34.0)
MCHC: 33.3 g/dL (ref 30.0–36.0)
MCV: 92.7 fL (ref 78.0–100.0)
PLATELETS: 139 10*3/uL — AB (ref 150–400)
RBC: 2.62 MIL/uL — AB (ref 4.22–5.81)
RDW: 14.2 % (ref 11.5–15.5)
WBC: 11.5 10*3/uL — ABNORMAL HIGH (ref 4.0–10.5)

## 2016-12-03 MED ORDER — SODIUM CHLORIDE 0.9 % IV BOLUS (SEPSIS)
250.0000 mL | Freq: Once | INTRAVENOUS | Status: AC
  Administered 2016-12-03: 250 mL via INTRAVENOUS

## 2016-12-03 NOTE — Progress Notes (Signed)
Subjective: Sleeping but arousable  Objective: Vital signs in last 24 hours: Temp:  [99 F (37.2 C)-99.2 F (37.3 C)] 99 F (37.2 C) (04/06 1900) Pulse Rate:  [84-88] 88 (04/06 1900) Resp:  [19] 19 (04/06 1900) BP: (93-100)/(61-72) 100/61 (04/06 1900) SpO2:  [95 %-96 %] 96 % (04/06 1900) Last BM Date:  (PTA)  Intake/Output from previous day: 04/06 0701 - 04/07 0700 In: 820.8 [P.O.:440; I.V.:380.8] Out: -  Intake/Output this shift: No intake/output data recorded.  Exam: Lungs clear CV irregular Abdomen soft, non -distended  Lab Results:   Recent Labs  12/02/16 0714 12/03/16 0427  WBC 16.3* 11.5*  HGB 9.0* 8.1*  HCT 26.8* 24.3*  PLT 163 139*   BMET  Recent Labs  12/01/16 2223 12/01/16 2237 12/03/16 0427  NA 137 139 139  K 3.8 3.8 4.1  CL 103 105 107  CO2 24  --  21*  GLUCOSE 140* 141* 103*  BUN 26* 29* 44*  CREATININE 1.40* 1.40* 1.71*  CALCIUM 9.0  --  8.9   PT/INR No results for input(s): LABPROT, INR in the last 72 hours. ABG No results for input(s): PHART, HCO3 in the last 72 hours.  Invalid input(s): PCO2, PO2  Studies/Results: Dg Chest 1 View  Result Date: 12/01/2016 CLINICAL DATA:  Ground level fall at home tonight, at 19:40. EXAM: CHEST 1 VIEW COMPARISON:  11/20/2015 FINDINGS: Stable moderate cardiomegaly. Prior sternotomy and CABG. The lungs are clear. The pulmonary vasculature is normal. There is no pneumothorax. No effusion. Hilar and mediastinal contours are unremarkable and unchanged. The bones appear osteopenic, but no displaced fractures are evident. Stable healed fracture deformity of the left fifth rib. IMPRESSION: No acute findings. Electronically Signed   By: Ellery Plunk M.D.   On: 12/01/2016 22:36   Dg Wrist Complete Right  Result Date: 12/02/2016 CLINICAL DATA:  Larey Seat at home tonight EXAM: RIGHT WRIST - COMPLETE 3+ VIEW COMPARISON:  None. FINDINGS: There is a transverse fracture of the distal radius, intra-articular at the  ulnar aspect of the radiocarpal articulation. There is a transverse fracture of the ulnar styloid. No dislocation. IMPRESSION: Fractures of the distal radius and ulnar styloid. Electronically Signed   By: Ellery Plunk M.D.   On: 12/02/2016 00:56   Ct Head Wo Contrast  Result Date: 12/02/2016 CLINICAL DATA:  Larey Seat at home tonight. EXAM: CT HEAD WITHOUT CONTRAST CT CERVICAL SPINE WITHOUT CONTRAST TECHNIQUE: Multidetector CT imaging of the head and cervical spine was performed following the standard protocol without intravenous contrast. Multiplanar CT image reconstructions of the cervical spine were also generated. COMPARISON:  12/15/2015 FINDINGS: CT HEAD FINDINGS Brain: There is no intracranial hemorrhage, mass or evidence of acute infarction. There is prior left frontal infarction with encephalomalacia. There is moderate generalized atrophy. There is moderate chronic microvascular ischemic change. There is no significant extra-axial fluid collection. No acute intracranial findings are evident. Vascular: No hyperdense vessel or unexpected calcification. Skull: Normal. Negative for fracture or focal lesion. Sinuses/Orbits: No acute finding. Other: None. CT CERVICAL SPINE FINDINGS Alignment: Normal. Skull base and vertebrae: No acute fracture. No primary bone lesion or focal pathologic process. Soft tissues and spinal canal: No prevertebral fluid or swelling. No visible canal hematoma. Disc levels:  Moderate cervical degenerative disc and facet changes. Upper chest: Negative. Other: None IMPRESSION: 1. No acute intracranial findings. Prior left frontal infarction with encephalomalacia. There is moderate generalized atrophy and chronic appearing white matter hypodensities which likely represent small vessel ischemic disease. 2. Negative for acute  cervical spine fracture. Electronically Signed   By: Ellery Plunk M.D.   On: 12/02/2016 00:37   Ct Chest W Contrast  Result Date: 12/02/2016 CLINICAL DATA:   Pain after fall EXAM: CT CHEST, ABDOMEN, AND PELVIS WITH CONTRAST TECHNIQUE: Multidetector CT imaging of the chest, abdomen and pelvis was performed following the standard protocol during bolus administration of intravenous contrast. CONTRAST:  75mL ISOVUE-300 IOPAMIDOL (ISOVUE-300) INJECTION 61% COMPARISON:  Pelvic CT from 12/15/2015, chest radiograph from 11/20/2015 FINDINGS: CT CHEST FINDINGS Cardiovascular: Cardiomegaly without pericardial effusion. Three-vessel coronary arteriosclerosis is visualized. There is aortic atherosclerosis with ectasia of the ascending aorta measuring up to 3.8 cm. No dissection is noted. The patient is status post CABG. Atherosclerosis at the origins of the great vessels. Mediastinum/Nodes: No enlarged mediastinal, hilar, or axillary lymph nodes. Thyroid gland, trachea, and esophagus demonstrate no significant findings. Lungs/Pleura: There is dependent bibasilar atelectasis. No pulmonary consolidation or pneumothorax. No pleural effusion. Musculoskeletal: Rotator cuff calcific tendinopathy about the right shoulder. No acute fracture of the ribs. Chronic moderate compression of T4 without retropulsion. Mild inferior endplate compression of T7 also chronic in appearance. CT ABDOMEN PELVIS FINDINGS Hepatobiliary: Cholelithiasis without complication. Homogeneous enhancement of the liver without biliary dilatation. Pancreas: Atrophic pancreas without ductal dilatation or mass. Spleen: No splenomegaly or mass. Adrenals/Urinary Tract: Normal bilateral adrenal glands. Variable size cysts of both kidneys some are too small to further characterize. No obstructive uropathy. Cortical scarring of left kidney. Bladder stone is noted along the dependent wall measuring 15 mm. There is mural thickening of the bladder on the right, query inflammation or thickening due to mass effect from adjacent small pelvic side wall hemorrhage and edema. Ectasia of the left ureter without definite obstruction.  Bilateral extrarenal pelves are noted. Stomach/Bowel: Stomach is within normal limits. Appendix appears normal. No evidence of bowel wall thickening, distention, or inflammatory changes. Vascular/Lymphatic: Aortic and side branch atherosclerosis. No enlarged abdominal or pelvic lymph nodes. Reproductive: Prostate is unremarkable. Other: No abdominal wall hernia or abnormality. No abdominopelvic ascites. Musculoskeletal: Coronal fracture through the acetabular roof, anteromedial wall of the acetabulum extending into the right iliac bone consistent with an anterior column fracture of the acetabulum. Associated small amount extraperitoneal hemorrhage and edema along the right obturator internus and retro pubic space of Retzius causing slight mass effect on the adjacent bladder. Chronic moderate L2 fracture. ORIF of both proximal femora. IMPRESSION: 1. New anterior column fracture of the right acetabulum with associated small amount of rightsided hemorrhage and edema in the extraperitoneal retropubic space of Retzius. 2. Cardiomegaly with 3 vessel coronary arteriosclerosis and ectasia of the ascending aorta. Aortic atherosclerosis. 3. Chronic T4 and T7 compression fractures. 4. Uncomplicated cholelithiasis. 5. Bilateral renal cysts and cortical scarring of the left kidney. 6. Nonobstructing bladder calculus. Eccentric right-sided bladder wall thickening possibly from underdistention and mass-effect from the extraperitoneal hemorrhage fluid. Asymmetric cystitis/mucosal inflammation is not excluded. Electronically Signed   By: Tollie Eth M.D.   On: 12/02/2016 01:25   Ct Cervical Spine Wo Contrast  Result Date: 12/02/2016 CLINICAL DATA:  Larey Seat at home tonight. EXAM: CT HEAD WITHOUT CONTRAST CT CERVICAL SPINE WITHOUT CONTRAST TECHNIQUE: Multidetector CT imaging of the head and cervical spine was performed following the standard protocol without intravenous contrast. Multiplanar CT image reconstructions of the cervical  spine were also generated. COMPARISON:  12/15/2015 FINDINGS: CT HEAD FINDINGS Brain: There is no intracranial hemorrhage, mass or evidence of acute infarction. There is prior left frontal infarction with encephalomalacia. There is moderate generalized  atrophy. There is moderate chronic microvascular ischemic change. There is no significant extra-axial fluid collection. No acute intracranial findings are evident. Vascular: No hyperdense vessel or unexpected calcification. Skull: Normal. Negative for fracture or focal lesion. Sinuses/Orbits: No acute finding. Other: None. CT CERVICAL SPINE FINDINGS Alignment: Normal. Skull base and vertebrae: No acute fracture. No primary bone lesion or focal pathologic process. Soft tissues and spinal canal: No prevertebral fluid or swelling. No visible canal hematoma. Disc levels:  Moderate cervical degenerative disc and facet changes. Upper chest: Negative. Other: None IMPRESSION: 1. No acute intracranial findings. Prior left frontal infarction with encephalomalacia. There is moderate generalized atrophy and chronic appearing white matter hypodensities which likely represent small vessel ischemic disease. 2. Negative for acute cervical spine fracture. Electronically Signed   By: Ellery Plunk M.D.   On: 12/02/2016 00:37   Ct Abdomen Pelvis W Contrast  Result Date: 12/02/2016 CLINICAL DATA:  Pain after fall EXAM: CT CHEST, ABDOMEN, AND PELVIS WITH CONTRAST TECHNIQUE: Multidetector CT imaging of the chest, abdomen and pelvis was performed following the standard protocol during bolus administration of intravenous contrast. CONTRAST:  75mL ISOVUE-300 IOPAMIDOL (ISOVUE-300) INJECTION 61% COMPARISON:  Pelvic CT from 12/15/2015, chest radiograph from 11/20/2015 FINDINGS: CT CHEST FINDINGS Cardiovascular: Cardiomegaly without pericardial effusion. Three-vessel coronary arteriosclerosis is visualized. There is aortic atherosclerosis with ectasia of the ascending aorta measuring up to  3.8 cm. No dissection is noted. The patient is status post CABG. Atherosclerosis at the origins of the great vessels. Mediastinum/Nodes: No enlarged mediastinal, hilar, or axillary lymph nodes. Thyroid gland, trachea, and esophagus demonstrate no significant findings. Lungs/Pleura: There is dependent bibasilar atelectasis. No pulmonary consolidation or pneumothorax. No pleural effusion. Musculoskeletal: Rotator cuff calcific tendinopathy about the right shoulder. No acute fracture of the ribs. Chronic moderate compression of T4 without retropulsion. Mild inferior endplate compression of T7 also chronic in appearance. CT ABDOMEN PELVIS FINDINGS Hepatobiliary: Cholelithiasis without complication. Homogeneous enhancement of the liver without biliary dilatation. Pancreas: Atrophic pancreas without ductal dilatation or mass. Spleen: No splenomegaly or mass. Adrenals/Urinary Tract: Normal bilateral adrenal glands. Variable size cysts of both kidneys some are too small to further characterize. No obstructive uropathy. Cortical scarring of left kidney. Bladder stone is noted along the dependent wall measuring 15 mm. There is mural thickening of the bladder on the right, query inflammation or thickening due to mass effect from adjacent small pelvic side wall hemorrhage and edema. Ectasia of the left ureter without definite obstruction. Bilateral extrarenal pelves are noted. Stomach/Bowel: Stomach is within normal limits. Appendix appears normal. No evidence of bowel wall thickening, distention, or inflammatory changes. Vascular/Lymphatic: Aortic and side branch atherosclerosis. No enlarged abdominal or pelvic lymph nodes. Reproductive: Prostate is unremarkable. Other: No abdominal wall hernia or abnormality. No abdominopelvic ascites. Musculoskeletal: Coronal fracture through the acetabular roof, anteromedial wall of the acetabulum extending into the right iliac bone consistent with an anterior column fracture of the  acetabulum. Associated small amount extraperitoneal hemorrhage and edema along the right obturator internus and retro pubic space of Retzius causing slight mass effect on the adjacent bladder. Chronic moderate L2 fracture. ORIF of both proximal femora. IMPRESSION: 1. New anterior column fracture of the right acetabulum with associated small amount of rightsided hemorrhage and edema in the extraperitoneal retropubic space of Retzius. 2. Cardiomegaly with 3 vessel coronary arteriosclerosis and ectasia of the ascending aorta. Aortic atherosclerosis. 3. Chronic T4 and T7 compression fractures. 4. Uncomplicated cholelithiasis. 5. Bilateral renal cysts and cortical scarring of the left kidney. 6. Nonobstructing  bladder calculus. Eccentric right-sided bladder wall thickening possibly from underdistention and mass-effect from the extraperitoneal hemorrhage fluid. Asymmetric cystitis/mucosal inflammation is not excluded. Electronically Signed   By: Tollie Eth M.D.   On: 12/02/2016 01:25   Dg Hip Unilat W Or Wo Pelvis 2-3 Views Right  Result Date: 12/01/2016 CLINICAL DATA:  Persistent pain after ground level fall at home tonight at 19:40 EXAM: DG HIP (WITH OR WITHOUT PELVIS) 2-3V RIGHT COMPARISON:  12/18/2015 FINDINGS: Four parallel fixation screws appear unchanged in the right hip. Unchanged intramedullary left femoral nail with interlocking dynamic hip screw. No evidence of hardware failure. No evidence of acute fracture. No dislocation. The bones are osteopenic, decreasing sensitivity for nondisplaced fractures. IMPRESSION: Negative for acute fracture or dislocation. Fixation hardware appears intact. Electronically Signed   By: Ellery Plunk M.D.   On: 12/01/2016 22:38    Anti-infectives: Anti-infectives    None      Assessment/Plan: s/p * No surgery found *  s/p fall, dementia, radius fracture, acetabular fracture  hgb down, UOP down, creatinine up  Bolus IVF Continue to monitor Working on  eventual placement  LOS: 1 day    Jacob [name] A 12/03/2016

## 2016-12-04 LAB — CBC
HCT: 23.5 % — ABNORMAL LOW (ref 39.0–52.0)
Hemoglobin: 7.9 g/dL — ABNORMAL LOW (ref 13.0–17.0)
MCH: 31.5 pg (ref 26.0–34.0)
MCHC: 33.6 g/dL (ref 30.0–36.0)
MCV: 93.6 fL (ref 78.0–100.0)
Platelets: 127 10*3/uL — ABNORMAL LOW (ref 150–400)
RBC: 2.51 MIL/uL — ABNORMAL LOW (ref 4.22–5.81)
RDW: 14.3 % (ref 11.5–15.5)
WBC: 10 10*3/uL (ref 4.0–10.5)

## 2016-12-04 LAB — BASIC METABOLIC PANEL
Anion gap: 7 (ref 5–15)
BUN: 39 mg/dL — AB (ref 6–20)
CHLORIDE: 109 mmol/L (ref 101–111)
CO2: 22 mmol/L (ref 22–32)
CREATININE: 1.48 mg/dL — AB (ref 0.61–1.24)
Calcium: 8.3 mg/dL — ABNORMAL LOW (ref 8.9–10.3)
GFR calc Af Amer: 44 mL/min — ABNORMAL LOW (ref >60)
GFR calc non Af Amer: 38 mL/min — ABNORMAL LOW (ref >60)
Glucose, Bld: 102 mg/dL — ABNORMAL HIGH (ref 65–99)
POTASSIUM: 4.4 mmol/L (ref 3.5–5.1)
SODIUM: 138 mmol/L (ref 135–145)

## 2016-12-04 NOTE — Progress Notes (Signed)
  Subjective: Easily to wake this morning Baseline dementia   Objective: Vital signs in last 24 hours: Temp:  [97 F (36.1 C)-98.8 F (37.1 C)] 98.8 F (37.1 C) (04/08 0652) Pulse Rate:  [82-86] 82 (04/08 0652) Resp:  [18] 18 (04/08 0652) BP: (122-151)/(63-85) 122/63 (04/08 0652) SpO2:  [95 %-99 %] 98 % (04/08 0652) Last BM Date:  (PTA)  Intake/Output from previous day: 04/07 0701 - 04/08 0700 In: 886.3 [P.O.:120; I.V.:766.3] Out: 250 [Urine:250] Intake/Output this shift: No intake/output data recorded.  Exam: Awake Appears comfortable Lungs clear Abdomen soft  Lab Results:   Recent Labs  12/03/16 0427 12/04/16 0311  WBC 11.5* 10.0  HGB 8.1* 7.9*  HCT 24.3* 23.5*  PLT 139* 127*   BMET  Recent Labs  12/03/16 0427 12/04/16 0311  NA 139 138  K 4.1 4.4  CL 107 109  CO2 21* 22  GLUCOSE 103* 102*  BUN 44* 39*  CREATININE 1.71* 1.48*  CALCIUM 8.9 8.3*   PT/INR No results for input(s): LABPROT, INR in the last 72 hours. ABG No results for input(s): PHART, HCO3 in the last 72 hours.  Invalid input(s): PCO2, PO2  Studies/Results: No results found.  Anti-infectives: Anti-infectives    None      Assessment/Plan: s/p * No surgery found *  Fall with hx of dementia, wrist fracture, hip fracture  Creatinine improved.  Still with anemia.  Poor intake  Disposition planning   LOS: 2 days    Eron Goble A 12/04/2016

## 2016-12-05 ENCOUNTER — Encounter (HOSPITAL_COMMUNITY): Payer: Self-pay | Admitting: General Practice

## 2016-12-05 DIAGNOSIS — L8990 Pressure ulcer of unspecified site, unspecified stage: Secondary | ICD-10-CM | POA: Insufficient documentation

## 2016-12-05 LAB — CBC
HCT: 24 % — ABNORMAL LOW (ref 39.0–52.0)
HEMOGLOBIN: 8.1 g/dL — AB (ref 13.0–17.0)
MCH: 31.9 pg (ref 26.0–34.0)
MCHC: 33.8 g/dL (ref 30.0–36.0)
MCV: 94.5 fL (ref 78.0–100.0)
PLATELETS: 131 10*3/uL — AB (ref 150–400)
RBC: 2.54 MIL/uL — AB (ref 4.22–5.81)
RDW: 14.6 % (ref 11.5–15.5)
WBC: 8.1 10*3/uL (ref 4.0–10.5)

## 2016-12-05 LAB — BASIC METABOLIC PANEL
Anion gap: 11 (ref 5–15)
BUN: 29 mg/dL — AB (ref 6–20)
CO2: 21 mmol/L — ABNORMAL LOW (ref 22–32)
CREATININE: 1.3 mg/dL — AB (ref 0.61–1.24)
Calcium: 8.5 mg/dL — ABNORMAL LOW (ref 8.9–10.3)
Chloride: 109 mmol/L (ref 101–111)
GFR calc Af Amer: 51 mL/min — ABNORMAL LOW (ref >60)
GFR, EST NON AFRICAN AMERICAN: 44 mL/min — AB (ref >60)
Glucose, Bld: 99 mg/dL (ref 65–99)
Potassium: 4.3 mmol/L (ref 3.5–5.1)
SODIUM: 141 mmol/L (ref 135–145)

## 2016-12-05 MED ORDER — POLYETHYLENE GLYCOL 3350 17 G PO PACK
17.0000 g | PACK | Freq: Two times a day (BID) | ORAL | Status: DC
  Administered 2016-12-05 – 2016-12-07 (×4): 17 g via ORAL
  Filled 2016-12-05 (×4): qty 1

## 2016-12-05 MED ORDER — ENSURE ENLIVE PO LIQD
237.0000 mL | Freq: Three times a day (TID) | ORAL | Status: DC
  Administered 2016-12-05 – 2016-12-06 (×4): 237 mL via ORAL

## 2016-12-05 MED ORDER — MORPHINE SULFATE (PF) 2 MG/ML IV SOLN
1.0000 mg | Freq: Four times a day (QID) | INTRAVENOUS | Status: DC | PRN
  Administered 2016-12-05 (×3): 2 mg via INTRAVENOUS
  Filled 2016-12-05 (×3): qty 1

## 2016-12-05 NOTE — Progress Notes (Signed)
   Subjective:    Patient reports pain as mild.  Sleeping.  Is NWB right wrist fx and right anterior column acetabular fx. Daughter and wife at bedside and xrays reviewed and plan discussed.   Getting OOB to recliner.   Objective: Vital signs in last 24 hours: Temp:  [97.4 F (36.3 C)-98.2 F (36.8 C)] 98.2 F (36.8 C) (04/09 0533) Pulse Rate:  [83-88] 83 (04/09 0533) Resp:  [17] 17 (04/09 0533) BP: (147-179)/(61-64) 147/62 (04/09 0533) SpO2:  [100 %] 100 % (04/09 0533)  Intake/Output from previous day: 04/08 0701 - 04/09 0700 In: 2203.8 [P.O.:420; I.V.:1783.8] Out: 200 [Urine:200] Intake/Output this shift: Total I/O In: 120 [P.O.:120] Out: -    Recent Labs  12/03/16 0427 12/04/16 0311 12/05/16 0724  HGB 8.1* 7.9* 8.1*    Recent Labs  12/04/16 0311 12/05/16 0724  WBC 10.0 8.1  RBC 2.51* 2.54*  HCT 23.5* 24.0*  PLT 127* 131*    Recent Labs  12/04/16 0311 12/05/16 0724  NA 138 141  K 4.4 4.3  CL 109 109  CO2 22 21*  BUN 39* 29*  CREATININE 1.48* 1.30*  GLUCOSE 102* 99  CALCIUM 8.3* 8.5*   No results for input(s): LABPT, INR in the last 72 hours.  PE :   Splint intact. LL equal.    Getting OOB to recliner with PT.  No results found.  Assessment/Plan:        SNF .  ROV 2 wks for Short arm cast for right wrist fx.  Hip pain should improve in 1 -2 wks and he will be OK for WB at 6 wks post Fx.      My cell [phone]  Eldred Manges 12/05/2016, 10:09 AM

## 2016-12-05 NOTE — Progress Notes (Signed)
Patient ID: Jacob [name], male   DOB: Jan 06, 1917, 83 y.o.   MRN: [id number]  [facility] Surgery Progress Note     Subjective: CC- right acetabular fracture Confused this morning, baseline dementia. Denies any pain. States that he is eating well, but per RN his appetite is suppressed. No BM recorded. In mittens with telesitter.  Objective: Vital signs in last 24 hours: Temp:  [97.4 F (36.3 C)-98.2 F (36.8 C)] 98.2 F (36.8 C) (04/09 0533) Pulse Rate:  [83-88] 83 (04/09 0533) Resp:  [17] 17 (04/09 0533) BP: (147-179)/(61-64) 147/62 (04/09 0533) SpO2:  [100 %] 100 % (04/09 0533) Last BM Date:  (PTA)  Intake/Output from previous day: 04/08 0701 - 04/09 0700 In: 2203.8 [P.O.:420; I.V.:1783.8] Out: 200 [Urine:200] Intake/Output this shift: No intake/output data recorded.  PE: Gen:  Alert, NAD Card:  Irregularly irregular, 2+DP bilaterally Pulm:  CTAB, no W/R/R, effort normal Abd: Soft, NT/ND, +BS, no HSM, no hernia RUE: in splint, BUE in mittens BLE:  No erythema or edema; right hip tender  Lab Results:   Recent Labs  12/03/16 0427 12/04/16 0311  WBC 11.5* 10.0  HGB 8.1* 7.9*  HCT 24.3* 23.5*  PLT 139* 127*   BMET  Recent Labs  12/03/16 0427 12/04/16 0311  NA 139 138  K 4.1 4.4  CL 107 109  CO2 21* 22  GLUCOSE 103* 102*  BUN 44* 39*  CREATININE 1.71* 1.48*  CALCIUM 8.9 8.3*   PT/INR No results for input(s): LABPROT, INR in the last 72 hours. CMP     Component Value Date/Time   NA 138 12/04/2016 0311   NA 142 08/01/2014 1750   K 4.4 12/04/2016 0311   K 3.9 08/01/2014 1750   CL 109 12/04/2016 0311   CL 107 08/01/2014 1750   CO2 22 12/04/2016 0311   CO2 27 08/01/2014 1750   GLUCOSE 102 (H) 12/04/2016 0311   GLUCOSE 128 (H) 08/01/2014 1750   BUN 39 (H) 12/04/2016 0311   BUN 25 (H) 08/01/2014 1750   CREATININE 1.48 (H) 12/04/2016 0311   CREATININE 1.17 08/01/2014 1750   CREATININE 1.02 07/03/2013   CALCIUM 8.3 (L) 12/04/2016 0311   CALCIUM 8.7 08/01/2014 1750   PROT 7.4 12/01/2016 2223   ALBUMIN 3.4 (L) 12/01/2016 2223   ALBUMIN 3.7 07/03/2013   AST 29 12/01/2016 2223   AST 17 07/03/2013   ALT 22 12/01/2016 2223   ALT 9 07/03/2013   ALKPHOS 88 12/01/2016 2223   ALKPHOS 76 07/03/2013   BILITOT 0.6 12/01/2016 2223   BILITOT 0.4 07/03/2013   GFRNONAA 38 (L) 12/04/2016 0311   GFRNONAA >60 08/01/2014 1750   GFRNONAA 42 (L) 11/20/2013 1336   GFRAA 44 (L) 12/04/2016 0311   GFRAA >60 08/01/2014 1750   GFRAA 49 (L) 11/20/2013 1336   Lipase  No results found for: LIPASE     Studies/Results: No results found.  Anti-infectives: Anti-infectives    None       Assessment/Plan Fall Right distal radius and ulnar styloid fracture - per Dr. Ophelia Charter, conservative treatment in splint, cast after 1 week when swelling decreased Right acetabular fracture with small associated hematoma in the pelvis - per Dr. Ophelia Charter, NWB RLE, to begin bed to recliner transfers today, repeat XR at 1 week Anemia - Hold Eliquis indefinitely, Hg 8.1 stable AKI with possible CKD - improving, Cr 1.3 from 1.48 (Cr in March ~1.5) Dementia DNR/no surgery per family  H/o CVA CAD with h/o CABG x4 Chronic atrial  fibrillation - was on eliquis prior to fall HLD - simvastatin HTN Hypothyroid - synthroid BPH - proscar, myrbetriq  ID - none  FEN - regular diet, miralax BID, add Ensure VTE - SCDs, hold chemical DVT prophylaxis  Dispo - PT. D/c telesitter, keep mittens. Placement?   LOS: 3 days    Edson Snowball , [facility] 12/05/2016, 8:04 AM Pager: [phone] Consults: [phone] Mon-Fri 7:00 am-4:30 pm Sat-Sun 7:00 am-11:30 am

## 2016-12-05 NOTE — Progress Notes (Addendum)
I paged Dr. Marca Ancona pt is constipated and ordered Miralax 2x/day.

## 2016-12-05 NOTE — Consult Note (Signed)
            [facility] CM Primary Care Navigator  12/05/2016  DANZIG MACGREGOR [birth year] [id number]  Went to see at the bedside to identify possible discharge needs but patient does not give appropriate responses due to dementia. No family present in the room.  Chart review reveals that patient had a fall which resulted to right acetabular fracture and a right radius fracture that had led to this admission.  Patient's primary care provider is Dr. Eustaquio Boyden with [facility] at [facility].   Per Inpatient social worker's note, daughter had expressed concern that patient's care has become too much for spouse to take care of him at home and believes that patient may need to transition for long term care at skilled nursing facility. Patient's daughter was agreeable to get help from Child psychotherapist for a long term placement for patient.  Patient had been at [facility] in the past, and family is open for him going back there.  For additional questions please contact:  Karin Golden A. Shakya Sebring, BSN, RN-BC [facility] PRIMARY CARE Navigator Cell: [phone]

## 2016-12-05 NOTE — Progress Notes (Signed)
Telesitter d/c'd.

## 2016-12-05 NOTE — Evaluation (Signed)
Physical Therapy Evaluation Patient Details Name: Jacob [name] MRN: [id number] DOB: 08-07-1917 Today's Date: 12/05/2016   History of Present Illness  Pt is a 83 yo male admitted with R hip pain s/p fall. dx with R acetabular fx and R wrist fx. PMH significant for L MCA stroke, CABG, CAD, and chronic A fib   Clinical Impression  Pt admitted with above diagnosis. Pt currently with functional limitations due to the deficits listed below (see PT Problem List). Pt is limited by pain, refusing to try to come to the EoB.  Pt did participate in limited therex in the bed on his uninvolved L side. Pt will benefit from skilled PT to increase their independence and safety with mobility to allow discharge to the venue listed below.       Follow Up Recommendations SNF;Supervision/Assistance - 24 hour    Equipment Recommendations   (TBD at next venue)    Recommendations for Other Services OT consult     Precautions / Restrictions Precautions Precautions: Fall Restrictions Weight Bearing Restrictions: Yes RUE Weight Bearing: Non weight bearing RLE Weight Bearing: Non weight bearing      Mobility  Bed Mobility Overal bed mobility: Needs Assistance             General bed mobility comments: Pt refused to move R LE to come to EoB even with assistance                 Balance Overall balance assessment: History of Falls                                           Pertinent Vitals/Pain Pain Assessment: Faces Faces Pain Scale: Hurts little more Pain Location: R hip and R wrist Pain Descriptors / Indicators: Grimacing;Guarding Pain Intervention(s): Limited activity within patient's tolerance;Monitored during session    Home Living Family/patient expects to be discharged to:: Private residence Living Arrangements: Spouse/significant other;Children Available Help at Discharge: Skilled Nursing Facility;Available 24 hours/day                  Prior  Function Level of Independence: Independent with assistive device(s)         Comments: household ambulator     Hand Dominance        Extremity/Trunk Assessment   Upper Extremity Assessment Upper Extremity Assessment: Defer to OT evaluation    Lower Extremity Assessment Lower Extremity Assessment: RLE deficits/detail;LLE deficits/detail;Difficult to assess due to impaired cognition RLE Deficits / Details: pt would not move due to pain RLE: Unable to fully assess due to pain LLE Deficits / Details: hip and knee ROM limited, could not fully assess strength due to pt confusion       Communication   Communication: Prefers language other than [place] (Spanish, translated through nursing tech and daughter)  Cognition Arousal/Alertness: Awake/alert Behavior During Therapy: Agitated Overall Cognitive Status: Impaired/Different from baseline Area of Impairment: Following commands;Safety/judgement;Attention                   Current Attention Level: Selective   Following Commands: Follows one step commands inconsistently Safety/Judgement: Decreased awareness of safety     General Comments: Pt daughter states that he is more confused than before coming into the hospital       General Comments      Exercises General Exercises - Lower Extremity Ankle Circles/Pumps: AROM;Both;5 reps;Supine Heel Slides:  Left;10 reps;Supine;AAROM;AROM Hip ABduction/ADduction: Left;5 reps;Supine;AAROM   Assessment/Plan    PT Assessment Patient needs continued PT services  PT Problem List Decreased strength;Decreased range of motion;Decreased activity tolerance;Decreased mobility;Decreased knowledge of use of DME;Decreased safety awareness;Pain       PT Treatment Interventions DME instruction;Gait training;Functional mobility training;Therapeutic activities;Therapeutic exercise;Balance training;Patient/family education;Cognitive remediation    PT Goals (Current goals can be found in  the Care Plan section)  Acute Rehab PT Goals Patient Stated Goal: to decrease pain PT Goal Formulation: With patient/family Time For Goal Achievement: 12/19/16 Potential to Achieve Goals: Fair    Frequency Min 3X/week    End of Session   Activity Tolerance: Patient limited by pain Patient left: in bed;with nursing/sitter in room;with call bell/phone within reach;with family/visitor present Nurse Communication: Mobility status PT Visit Diagnosis: Pain;Repeated falls (R29.6);Other abnormalities of gait and mobility (R26.89) Pain - Right/Left: Right Pain - part of body: Hip (wrist)  VSS    Time: 1610-9604 PT Time Calculation (min) (ACUTE ONLY): 64 min   Charges:   PT Evaluation $PT Eval Moderate Complexity: 1 Procedure PT Treatments $Therapeutic Exercise: 23-37 mins $Therapeutic Activity: 8-22 mins   PT G Codes:        Jacob Wernert B. Jacob [name] PT, DPT Acute Rehabilitation  [phone] Pager [phone]    Jacob [name] 12/05/2016, 11:41 AM

## 2016-12-05 NOTE — Progress Notes (Signed)
Pt BP was 179/64 rechecked 152/62 at 2159 and at0249 BP 162/61 rechecked 154/63 but the latest BP 147/62, given 2x pain meds, applied mittens because he is pulling his condom cath, kept pt dry and clean, repositioned, given water to drink, continue telesitter.

## 2016-12-06 MED ORDER — MORPHINE SULFATE (PF) 2 MG/ML IV SOLN
1.0000 mg | Freq: Three times a day (TID) | INTRAVENOUS | Status: DC | PRN
  Administered 2016-12-06: 1 mg via INTRAVENOUS
  Filled 2016-12-06 (×2): qty 1

## 2016-12-06 NOTE — Care Management Important Message (Signed)
Important Message  Patient Details  Name: Jacob [name] MRN: [id number] Date of Birth: May 20, 1917   Medicare Important Message Given:  Yes    Dorena Bodo 12/06/2016, 12:32 PM

## 2016-12-06 NOTE — Progress Notes (Signed)
   Subjective:    Patient reports pain as mild.    Objective: Vital signs in last 24 hours: Temp:  [98.5 F (36.9 C)-98.9 F (37.2 C)] 98.5 F (36.9 C) (04/09 2014) Pulse Rate:  [80-96] 80 (04/09 2014) Resp:  [17-19] 19 (04/09 2014) BP: (123-128)/(88-89) 123/88 (04/09 2014) SpO2:  [95 %-98 %] 98 % (04/09 2014)  Intake/Output from previous day: 04/09 0701 - 04/10 0700 In: 120 [P.O.:120] Out: -  Intake/Output this shift: No intake/output data recorded.   Recent Labs  12/04/16 0311 12/05/16 0724  HGB 7.9* 8.1*    Recent Labs  12/04/16 0311 12/05/16 0724  WBC 10.0 8.1  RBC 2.51* 2.54*  HCT 23.5* 24.0*  PLT 127* 131*    Recent Labs  12/04/16 0311 12/05/16 0724  NA 138 141  K 4.4 4.3  CL 109 109  CO2 22 21*  BUN 39* 29*  CREATININE 1.48* 1.30*  GLUCOSE 102* 99  CALCIUM 8.3* 8.5*   No results for input(s): LABPT, INR in the last 72 hours.  Neurologically intact No results found.  Assessment/Plan:    Conservative Tx for acetabular Fx. Office followup 2 wks.  I talked with his daughter yesterday . No family in room this AM  Eldred Manges 12/06/2016, 8:21 AM

## 2016-12-06 NOTE — Progress Notes (Signed)
qPhysical Therapy Treatment Patient Details Name: Jacob [name] MRN: [id number] DOB: 1917/05/01 Today's Date: 12/06/2016    History of Present Illness Pt is a 83 yo male admitted with R hip pain s/p fall. dx with R acetabular fx and R wrist fx. PMH significant for L MCA stroke, CABG, CAD, and chronic A fib     PT Comments    Pt with more participation in today's session. Pt seated in recliner at entry and able to perform AROM of ankles and knees bilaterally. Pt assisted with scooting forward in recliner for squat pivot transfer back into bed. Pt maxA x2 for transfer to EoB. Pt also maxAx2 for sit to supine in bed. Pt requires skilled PT for continued transfer training and to progress to gait training as well as to improve LE strength and endurance to safely navigate his environment.     Follow Up Recommendations        Equipment Recommendations   (TBD at next venue)    Recommendations for Other Services OT consult     Precautions / Restrictions Precautions Precautions: Fall Restrictions Weight Bearing Restrictions: Yes RUE Weight Bearing: Non weight bearing RLE Weight Bearing: Non weight bearing    Mobility  Bed Mobility Overal bed mobility: Needs Assistance Bed Mobility: Supine to Sit     Supine to sit: Max assist;+2 for physical assistance     General bed mobility comments: Pt scared to move due to pain but with encourgment via the interpreter he participated  Transfers Overall transfer level: Needs assistance Equipment used: None Transfers: Squat Pivot Transfers     Squat pivot transfers: Max assist;+2 physical assistance;+2 safety/equipment     General transfer comment: Max VCs and encouragment to sequence transfer       Balance Overall balance assessment: History of Falls                                          Cognition Arousal/Alertness: Awake/alert Behavior During Therapy: Restless Overall Cognitive Status: Impaired/Different  from baseline Area of Impairment: Following commands;Safety/judgement;Awareness                   Current Attention Level: Sustained   Following Commands: Follows one step commands inconsistently Safety/Judgement: Decreased awareness of safety;Decreased awareness of deficits Awareness: Intellectual   General Comments: Pt appeared more happy and maintained conversation longer for session      Exercises General Exercises - Lower Extremity Ankle Circles/Pumps: AROM;Both;Seated;10 reps Heel Slides: AROM;Both;5 reps;Seated    General Comments General comments (skin integrity, edema, etc.): Daughter and wife in room.       Pertinent Vitals/Pain Pain Assessment: Faces Faces Pain Scale: Hurts little more Pain Location: R hip and R wrist Pain Descriptors / Indicators: Grimacing;Guarding Pain Intervention(s): Monitored during session  VSS    Home Living Family/patient expects to be discharged to:: Skilled nursing facility Living Arrangements: Spouse/significant other;Children;Other relatives Available Help at Discharge: Skilled Nursing Facility;Available 24 hours/day           Additional Comments: Pt family voiced concern with his current occupational performance and would like him to receive rehab    Prior Function Level of Independence: Needs assistance    ADL's / Homemaking Assistance Needed: Pt able to perform grooming and self feeding. Wife would A with bathing and dressing     PT Goals (current goals can now be found in the care  plan section) Acute Rehab PT Goals Patient Stated Goal: Decrease pain PT Goal Formulation: With patient/family Time For Goal Achievement: 12/19/16 Potential to Achieve Goals: Fair Progress towards PT goals: Progressing toward goals    Frequency    Min 3X/week      PT Plan Current plan remains appropriate    Co-evaluation   Reason for Co-Treatment: Complexity of the patient's impairments (multi-system involvement);For  patient/therapist safety;To address functional/ADL transfers PT goals addressed during session: Mobility/safety with mobility OT goals addressed during session: ADL's and self-care;Strengthening/ROM     End of Session   Activity Tolerance: Patient limited by pain Patient left: in bed;with call bell/phone within reach;with bed alarm set Nurse Communication: Mobility status (need for condom catheter replacement) PT Visit Diagnosis: Other abnormalities of gait and mobility (R26.89);Pain Pain - Right/Left: Right Pain - part of body: Hip;Hand     Time: 1610-9604 PT Time Calculation (min) (ACUTE ONLY): 31 min  Charges:  $Therapeutic Activity: 8-22 mins                    G Codes:       Jennafer Gladue B. Beverely Risen PT, DPT Acute Rehabilitation  [phone] Pager [phone]     [name] 12/06/2016, 2:57 PM

## 2016-12-06 NOTE — Progress Notes (Signed)
Plan for DC to [facility] tomorrow- updated facility and spoke with pt dtr to confirm plan  [facility] also updated and they will finalize auth with updated PT notes  Burna Sis, LCSW Clinical Social Worker [phone]

## 2016-12-06 NOTE — Evaluation (Addendum)
Occupational Therapy Evaluation Patient Details Name: Jacob [name] MRN: [id number] DOB: 1917/08/27 Today's Date: 12/06/2016    History of Present Illness Pt is a 83 yo male admitted with R hip pain s/p fall. dx with R acetabular fx and R wrist fx. PMH significant for L MCA stroke, CABG, CAD, and chronic A fib    Clinical Impression   PTA, pt lived with his wife, daughter, and son-in-law. Pt performed grooming, self feeding, and functional mobility with A for dressing and bathing. Currently, pt requires Mod A to maintain seated posture and Max VCs to perform grooming at EOB; pt possibly leaning to L when seated due to pain at R hip. Pt requires Max A +2 to transfer from EOB to recliner and adhere to NWB precautions of RLE. Pt would benefit from continues skilled acute OT to facilitate safe dc and progress towards goals. Recommend pt dc to SNF for further OT to increase pt's occupational performance and participation.     Follow Up Recommendations  SNF;Supervision/Assistance - 24 hour    Equipment Recommendations  Other (comment) (Defer to next venue)    Recommendations for Other Services PT consult     Precautions / Restrictions Precautions Precautions: Fall Restrictions Weight Bearing Restrictions: Yes RUE Weight Bearing: Non weight bearing RLE Weight Bearing: Non weight bearing      Mobility Bed Mobility Overal bed mobility: Needs Assistance Bed Mobility: Supine to Sit     Supine to sit: Max assist     General bed mobility comments: Pt scared to move due to pain but with encourgment via the interpreter he participated  Transfers Overall transfer level: Needs assistance Equipment used: None Transfers: Squat Pivot Transfers     Squat pivot transfers: Max assist;+2 physical assistance;+2 safety/equipment     General transfer comment: Pt required Max A due to his decreased coginition and inability to adhere to WB precaution    Balance Overall balance assessment:  History of Falls                                         ADL either performed or assessed with clinical judgement   ADL Overall ADL's : Needs assistance/impaired Eating/Feeding: Sitting;Cueing for sequencing;Moderate assistance Eating/Feeding Details (indicate cue type and reason): Pt required Mod A to maintain sitting posture to perform feeding. Pt also limited due to NWB for RUE which is his dominant hand Grooming: Wash/dry face;Oral care;Moderate assistance;Sitting Grooming Details (indicate cue type and reason): Pt performed grooming at EOB with Mod A to maintain seated posture (pt had tendency to lean to left possibly to avoid pain at R hip). Pt used L hand to wash face and brush teeth and reqired Max VCs and encouragment to perform Upper Body Bathing: Moderate assistance;Bed level   Lower Body Bathing: Maximal assistance;Bed level   Upper Body Dressing : Moderate assistance;Sitting   Lower Body Dressing: Maximal assistance;Bed level   Toilet Transfer: Maximal assistance;+2 for physical assistance;+2 for safety/equipment;Squat-pivot;BSC (simulated to recliner)   Toileting- Clothing Manipulation and Hygiene: Total assistance       Functional mobility during ADLs: Total assistance;+2 for physical assistance;+2 for safety/equipment;Rolling walker (Unable to adhere to NWB precautions) General ADL Comments: Pt demonstrated increased participation per daughter who reports that this was his first time out of bed. Pt agreed to sit in recliner through he was scared of pain in RLE.  Vision         Perception     Praxis      Pertinent Vitals/Pain Pain Assessment: Faces Faces Pain Scale: Hurts little more Pain Location: R hip and R wrist Pain Descriptors / Indicators: Grimacing;Guarding Pain Intervention(s): Monitored during session     Hand Dominance Right   Extremity/Trunk Assessment Upper Extremity Assessment Upper Extremity Assessment: RUE  deficits/detail RUE Deficits / Details: Pt with R wrist fx and NWB precautions RUE: Unable to fully assess due to immobilization;Unable to fully assess due to pain RUE Sensation:  (Pt unable to verbalize) RUE Coordination: decreased fine motor;decreased gross motor   Lower Extremity Assessment Lower Extremity Assessment: RLE deficits/detail;Generalized weakness RLE Deficits / Details: RLE fx. Pt limited by pain. Pt unable to maintain NW precautions due to cogntitive status RLE: Unable to fully assess due to pain RLE Coordination: decreased fine motor;decreased gross motor       Communication Communication Communication: Prefers language other than English (Spanish, translated with spanish interpretter)   Cognition Arousal/Alertness: Awake/alert Behavior During Therapy: Restless Overall Cognitive Status: Impaired/Different from baseline (Pt has cogntitive deficits at baseline but daughter notes increased confusion since admission to hospital) Area of Impairment: Following commands;Safety/judgement;Awareness                       Following Commands: Follows one step commands inconsistently Safety/Judgement: Decreased awareness of safety;Decreased awareness of deficits Awareness: Intellectual   General Comments: Pt daughter states that he is more confused than before coming into the hospital. Pt hallucinating and muttering possibly due to medication.    General Comments  Daughter and wife present for session. Session greatly benefited from spanish interpreter    Exercises     Shoulder Instructions      Home Living Family/patient expects to be discharged to:: Skilled nursing facility Living Arrangements: Spouse/significant other;Children;Other relatives Available Help at Discharge: Skilled Nursing Facility;Available 24 hours/day                             Additional Comments: Pt family voiced concern with his current occupational performance and would like  him to receive rehab      Prior Functioning/Environment Level of Independence: Needs assistance    ADL's / Homemaking Assistance Needed: Pt able to perform grooming and self feeding. Wife would A with bathing and dressing            OT Problem List: Decreased strength;Decreased range of motion;Decreased activity tolerance;Impaired balance (sitting and/or standing);Decreased knowledge of use of DME or AE;Decreased cognition;Pain      OT Treatment/Interventions: Self-care/ADL training;Therapeutic exercise;Energy conservation;DME and/or AE instruction;Therapeutic activities;Patient/family education    OT Goals(Current goals can be found in the care plan section) Acute Rehab OT Goals Patient Stated Goal: Decrease pain OT Goal Formulation: With patient Time For Goal Achievement: 12/20/16 Potential to Achieve Goals: Good ADL Goals Pt Will Perform Grooming: with min assist;sitting Pt Will Perform Upper Body Dressing: with min assist;sitting Pt Will Transfer to Toilet: with max assist;stand pivot transfer;bedside commode (adhering to WB precautions)  OT Frequency: Min 2X/week   Barriers to D/C:            Co-evaluation              End of Session Equipment Utilized During Treatment: Gait belt;Rolling walker Nurse Communication: Mobility status;Precautions;Weight bearing status  Activity Tolerance: Patient tolerated treatment well;Patient limited by pain Patient left: in chair;with call bell/phone  within reach;with family/visitor present;Other (comment) (mitten on R hand to prevent WB throughout that hand)  OT Visit Diagnosis: Unsteadiness on feet (R26.81);Muscle weakness (generalized) (M62.81);History of falling (Z91.81);Pain;Other symptoms and signs involving cognitive function Pain - Right/Left: Right Pain - part of body: Leg                Time: 1130-1226 OT Time Calculation (min): 56 min Charges:  OT General Charges $OT Visit: 1 Procedure OT Evaluation $OT Eval  Moderate Complexity: 1 Procedure OT Treatments $Self Care/Home Management : 38-52 mins G-Codes:     [employer], OTR/L [phone]  Theodoro Grist Areil Ottey 12/06/2016, 2:04 PM

## 2016-12-06 NOTE — Progress Notes (Signed)
Patient ID: Jacob [name], male   DOB: 1916-11-16, 83 y.o.   MRN: [id number]  [facility] Surgery Progress Note     Subjective: CC- right acetabular fracture Spanish interpretor used for this visit. No family at bedside. Baseline dementia. Patient confused and wants to get out of bed. He denies any pain. Per RN her does not have much of an appetite.  Objective: Vital signs in last 24 hours: Temp:  [98.5 F (36.9 C)-98.9 F (37.2 C)] 98.5 F (36.9 C) (04/09 2014) Pulse Rate:  [80-96] 80 (04/09 2014) Resp:  [17-19] 19 (04/09 2014) BP: (123-128)/(88-89) 123/88 (04/09 2014) SpO2:  [95 %-98 %] 98 % (04/09 2014) Last BM Date:  (PTA)  Intake/Output from previous day: 04/09 0701 - 04/10 0700 In: 120 [P.O.:120] Out: -  Intake/Output this shift: No intake/output data recorded.  PE: Gen:  Alert, NAD, confused Card:  Irregularly irregular, 2+DP bilaterally Pulm:  CTAB, no W/R/R, effort normal Abd: Soft, NT/ND, +BS, no HSM, no hernia RUE: in splint, BUE in mittens BLE:  No erythema or edema; calves nontender; right hip tender  Lab Results:   Recent Labs  12/04/16 0311 12/05/16 0724  WBC 10.0 8.1  HGB 7.9* 8.1*  HCT 23.5* 24.0*  PLT 127* 131*   BMET  Recent Labs  12/04/16 0311 12/05/16 0724  NA 138 141  K 4.4 4.3  CL 109 109  CO2 22 21*  GLUCOSE 102* 99  BUN 39* 29*  CREATININE 1.48* 1.30*  CALCIUM 8.3* 8.5*   PT/INR No results for input(s): LABPROT, INR in the last 72 hours. CMP     Component Value Date/Time   NA 141 12/05/2016 0724   NA 142 08/01/2014 1750   K 4.3 12/05/2016 0724   K 3.9 08/01/2014 1750   CL 109 12/05/2016 0724   CL 107 08/01/2014 1750   CO2 21 (L) 12/05/2016 0724   CO2 27 08/01/2014 1750   GLUCOSE 99 12/05/2016 0724   GLUCOSE 128 (H) 08/01/2014 1750   BUN 29 (H) 12/05/2016 0724   BUN 25 (H) 08/01/2014 1750   CREATININE 1.30 (H) 12/05/2016 0724   CREATININE 1.17 08/01/2014 1750   CREATININE 1.02 07/03/2013   CALCIUM 8.5 (L)  12/05/2016 0724   CALCIUM 8.7 08/01/2014 1750   PROT 7.4 12/01/2016 2223   ALBUMIN 3.4 (L) 12/01/2016 2223   ALBUMIN 3.7 07/03/2013   AST 29 12/01/2016 2223   AST 17 07/03/2013   ALT 22 12/01/2016 2223   ALT 9 07/03/2013   ALKPHOS 88 12/01/2016 2223   ALKPHOS 76 07/03/2013   BILITOT 0.6 12/01/2016 2223   BILITOT 0.4 07/03/2013   GFRNONAA 44 (L) 12/05/2016 0724   GFRNONAA >60 08/01/2014 1750   GFRNONAA 42 (L) 11/20/2013 1336   GFRAA 51 (L) 12/05/2016 0724   GFRAA >60 08/01/2014 1750   GFRAA 49 (L) 11/20/2013 1336   Lipase  No results found for: LIPASE     Studies/Results: No results found.  Anti-infectives: Anti-infectives    None       Assessment/Plan Fall Right distal radius and ulnar styloid fracture - per Dr. Ophelia Charter, conservative treatment in splint, follow-up 2 weeks for short arm cast Right acetabular fracture with small associated hematoma in the pelvis - per Dr. Ophelia Charter, NWB RLE x6 weeks, ok for bed to recliner transfers, repeat XR at 1 week Anemia - Hold Eliquis indefinitely, Hg 8.1 (4/9) stable AKI with possible CKD - improving, Cr 1.3 (4/9) (Cr in March ~1.5) Dementia DNR/no surgery  per family  H/o CVA CAD with h/o CABG x4 Chronic atrial fibrillation - was on eliquis prior to fall HLD - simvastatin HTN Hypothyroid - synthroid BPH - proscar, myrbetriq  ID - none  FEN - regular diet, miralax BID, Ensure VTE - SCDs, hold chemical DVT prophylaxis  Dispo - SNF. Continue PT.   LOS: 4 days    Edson Snowball , [facility] Surgery 12/06/2016, 8:06 AM Pager: [phone] Consults: [phone] Mon-Fri 7:00 am-4:30 pm Sat-Sun 7:00 am-11:30 am

## 2016-12-06 NOTE — Progress Notes (Addendum)
Occupational Therapy Treatment Patient Details Name: Jacob [name] MRN: [id number] DOB: [birth year] Today's Date: 12/06/2016    History of present illness Pt is a 83 yo male admitted with R hip pain s/p fall. dx with R acetabular fx and R wrist fx. PMH significant for L MCA stroke, CABG, CAD, and chronic A fib    OT comments  Pt demonstrated increased communication with therapist via spanish interpreter and seems more awake to participate. Pt family reports that pt showed increased participation in self feeding while up in recliner for lunch. Pt required Max A +2 for safety to squat pivot from recliner to EOB. Pt continues to require Max A +2 to transfer from recliner to bed and perform bed mobility. Continue to recommend dc to SNF for further OT.    Follow Up Recommendations  SNF;Supervision/Assistance - 24 hour    Equipment Recommendations  Other (comment) (Defer to next venue)    Recommendations for Other Services PT consult    Precautions / Restrictions Precautions Precautions: Fall Restrictions Weight Bearing Restrictions: Yes RUE Weight Bearing: Non weight bearing RLE Weight Bearing: Non weight bearing       Mobility Bed Mobility Overal bed mobility: Needs Assistance Bed Mobility: Supine to Sit     Supine to sit: Max assist;+2 for physical assistance     General bed mobility comments: Pt scared to move due to pain but with encourgment via the interpreter he participated  Transfers Overall transfer level: Needs assistance Equipment used: None Transfers: Squat Pivot Transfers     Squat pivot transfers: Max assist;+2 physical assistance;+2 safety/equipment     General transfer comment: Max VCs and encouragment to sequence transfer    Balance Overall balance assessment: History of Falls                                         ADL either performed or assessed with clinical judgement   ADL Overall ADL's : Needs  assistance/impaired Eating/Feeding: Sitting;Cueing for sequencing;Moderate assistance Eating/Feeding Details (indicate cue type and reason): Pt required Mod A to maintain sitting posture to perform feeding. Pt also limited due to NWB for RUE which is his dominant hand Grooming: Wash/dry face;Oral care;Moderate assistance;Sitting Grooming Details (indicate cue type and reason): Pt performed grooming at EOB with Mod A to maintain seated posture (pt had tendency to lean to left possibly to avoid pain at R hip). Pt used L hand to wash face and brush teeth and reqired Max VCs and encouragment to perform Upper Body Bathing: Moderate assistance;Bed level   Lower Body Bathing: Maximal assistance;Bed level   Upper Body Dressing : Moderate assistance;Sitting;Cueing for sequencing Upper Body Dressing Details (indicate cue type and reason): Pt required Mod VCs to sequence task Lower Body Dressing: Maximal assistance;Bed level   Toilet Transfer: Maximal assistance;+2 for physical assistance;+2 for safety/equipment;Squat-pivot;BSC (simulated to recliner)   Toileting- Clothing Manipulation and Hygiene: Total assistance       Functional mobility during ADLs: Total assistance;+2 for physical assistance;+2 for safety/equipment General ADL Comments: Pt demonstrated increased participation per daughter who reports that this was his first time out of bed. Pt agreed to sit in recliner through he was scared of pain in RLE.      Vision       Perception     Praxis      Cognition Arousal/Alertness: Awake/alert Behavior During Therapy: Restless Overall Cognitive Status: Impaired/Different  from baseline Area of Impairment: Following commands;Safety/judgement;Awareness                   Current Attention Level: Sustained   Following Commands: Follows one step commands inconsistently Safety/Judgement: Decreased awareness of safety;Decreased awareness of deficits Awareness: Intellectual   General  Comments: Pt appeared more happy and maintained conversation longer for session        Exercises General Exercises - Lower Extremity Ankle Circles/Pumps: AROM;Both;Seated;10 reps Heel Slides: AROM;Both;5 reps;Seated   Shoulder Instructions       General Comments Daughter and wife in room.     Pertinent Vitals/ Pain       Pain Assessment: Faces Faces Pain Scale: Hurts little more Pain Location: R hip and R wrist Pain Descriptors / Indicators: Grimacing;Guarding Pain Intervention(s): Monitored during session  Home Living Family/patient expects to be discharged to:: Skilled nursing facility Living Arrangements: Spouse/significant other;Children;Other relatives Available Help at Discharge: Skilled Nursing Facility;Available 24 hours/day                             Additional Comments: Pt family voiced concern with his current occupational performance and would like him to receive rehab      Prior Functioning/Environment Level of Independence: Needs assistance    ADL's / Homemaking Assistance Needed: Pt able to perform grooming and self feeding. Wife would A with bathing and dressing       Frequency  Min 2X/week        Progress Toward Goals  OT Goals(current goals can now be found in the care plan section)  Progress towards OT goals: Progressing toward goals  Acute Rehab OT Goals Patient Stated Goal: Decrease pain OT Goal Formulation: With patient Time For Goal Achievement: 12/20/16 Potential to Achieve Goals: Good ADL Goals Pt Will Perform Grooming: with min assist;sitting Pt Will Perform Upper Body Dressing: with min assist;sitting Pt Will Transfer to Toilet: with max assist;stand pivot transfer;bedside commode (adhering to WB precautions)  Plan Discharge plan remains appropriate    Co-evaluation    PT/OT/SLP Co-Evaluation/Treatment: Yes Reason for Co-Treatment: Complexity of the patient's impairments (multi-system involvement);For  patient/therapist safety;To address functional/ADL transfers PT goals addressed during session: Mobility/safety with mobility OT goals addressed during session: ADL's and self-care;Strengthening/ROM      End of Session Equipment Utilized During Treatment: Gait belt;Rolling walker  OT Visit Diagnosis: Unsteadiness on feet (R26.81);Muscle weakness (generalized) (M62.81);History of falling (Z91.81);Pain;Other symptoms and signs involving cognitive function Pain - Right/Left: Right Pain - part of body: Leg   Activity Tolerance Patient tolerated treatment well;Patient limited by pain   Patient Left in bed;with call bell/phone within reach;with bed alarm set;with family/visitor present   Nurse Communication Mobility status;Precautions;Weight bearing status        Time: 1914-7829 OT Time Calculation (min): 32 min  Charges: OT General Charges $OT Visit: 1 Procedure OT Evaluation $OT Eval Moderate Complexity: 1 Procedure OT Treatments $Self Care/Home Management : 38-52 mins $Therapeutic Activity: 8-22 mins  Deklen Popelka, OTR/L [phone]   Theodoro Grist Damian Buckles 12/06/2016, 2:57 PM

## 2016-12-07 DIAGNOSIS — F0390 Unspecified dementia without behavioral disturbance: Secondary | ICD-10-CM | POA: Diagnosis not present

## 2016-12-07 DIAGNOSIS — S92153A Displaced avulsion fracture (chip fracture) of unspecified talus, initial encounter for closed fracture: Secondary | ICD-10-CM | POA: Diagnosis not present

## 2016-12-07 DIAGNOSIS — S52501S Unspecified fracture of the lower end of right radius, sequela: Secondary | ICD-10-CM | POA: Diagnosis not present

## 2016-12-07 DIAGNOSIS — R1312 Dysphagia, oropharyngeal phase: Secondary | ICD-10-CM | POA: Diagnosis not present

## 2016-12-07 DIAGNOSIS — R4182 Altered mental status, unspecified: Secondary | ICD-10-CM | POA: Diagnosis not present

## 2016-12-07 DIAGNOSIS — M6281 Muscle weakness (generalized): Secondary | ICD-10-CM | POA: Diagnosis not present

## 2016-12-07 DIAGNOSIS — S32401A Unspecified fracture of right acetabulum, initial encounter for closed fracture: Secondary | ICD-10-CM | POA: Diagnosis not present

## 2016-12-07 DIAGNOSIS — R41841 Cognitive communication deficit: Secondary | ICD-10-CM | POA: Diagnosis not present

## 2016-12-07 DIAGNOSIS — S32401S Unspecified fracture of right acetabulum, sequela: Secondary | ICD-10-CM | POA: Diagnosis not present

## 2016-12-07 DIAGNOSIS — S32401D Unspecified fracture of right acetabulum, subsequent encounter for fracture with routine healing: Secondary | ICD-10-CM | POA: Diagnosis not present

## 2016-12-07 DIAGNOSIS — S5291XA Unspecified fracture of right forearm, initial encounter for closed fracture: Secondary | ICD-10-CM | POA: Diagnosis not present

## 2016-12-07 DIAGNOSIS — Z5189 Encounter for other specified aftercare: Secondary | ICD-10-CM | POA: Diagnosis not present

## 2016-12-07 MED ORDER — TRAMADOL HCL 50 MG PO TABS: 50.0000 mg | ORAL_TABLET | Freq: Four times a day (QID) | ORAL | 0 refills | Status: DC | PRN

## 2016-12-07 MED ORDER — HYDRALAZINE HCL 20 MG/ML IJ SOLN
5.0000 mg | Freq: Four times a day (QID) | INTRAMUSCULAR | Status: DC | PRN
Start: 2016-12-07 — End: 2016-12-07
  Administered 2016-12-07: 5 mg via INTRAVENOUS
  Filled 2016-12-07: qty 1

## 2016-12-07 MED ORDER — ENSURE ENLIVE PO LIQD: 237.0000 mL | Freq: Three times a day (TID) | ORAL | 12 refills | Status: DC

## 2016-12-07 MED ORDER — POLYETHYLENE GLYCOL 3350 17 G PO PACK: 17.0000 g | PACK | Freq: Every day | ORAL | 0 refills | Status: AC | PRN

## 2016-12-07 MED ORDER — PANTOPRAZOLE SODIUM 40 MG PO TBEC: 40.0000 mg | DELAYED_RELEASE_TABLET | Freq: Every day | ORAL | Status: AC

## 2016-12-07 MED ORDER — LORAZEPAM 0.5 MG PO TABS
0.5000 mg | ORAL_TABLET | Freq: Once | ORAL | Status: AC
  Administered 2016-12-07: 0.5 mg via ORAL
  Filled 2016-12-07: qty 1

## 2016-12-07 NOTE — Progress Notes (Signed)
Discharge paperwork reviewed with patient's daughter. Patient is ready for discharge.

## 2016-12-07 NOTE — Clinical Social Work Placement (Signed)
   CLINICAL SOCIAL WORK PLACEMENT  NOTE  Date:  12/07/2016  Patient Details  Name: Jacob [name] MRN: [id number] Date of Birth: Oct 05, 1916  Clinical Social Work is seeking post-discharge placement for this patient at the Skilled  Nursing Facility level of care (*CSW will initial, date and re-position this form in  chart as items are completed):  Yes   Patient/family provided with [facility] Clinical Social Work Department's list of facilities offering this level of care within the geographic area requested by the patient (or if unable, by the patient's family).  Yes   Patient/family informed of their freedom to choose among providers that offer the needed level of care, that participate in Medicare, Medicaid or managed care program needed by the patient, have an available bed and are willing to accept the patient.  Yes   Patient/family informed of [facility]'s ownership interest in [facility] and [facility], as well as of the fact that they are under no obligation to receive care at these facilities.  PASRR submitted to EDS on       PASRR number received on       Existing PASRR number confirmed on 12/02/16     FL2 transmitted to all facilities in geographic area requested by pt/family on 12/02/16     FL2 transmitted to all facilities within larger geographic area on       Patient informed that his/her managed care company has contracts with or will negotiate with certain facilities, including the following:        Yes   Patient/family informed of bed offers received.  Patient chooses bed at [facility]     Physician recommends and patient chooses bed at      Patient to be transferred to [facility] on 12/07/16.  Patient to be transferred to facility by Ambulance     Patient family notified on 12/07/16 of transfer.  Name of family member notified:  Patient daughter at bedside     PHYSICIAN Please sign FL2, Please sign DNR     Additional Comment:   Macario Golds, LCSW [phone]

## 2016-12-07 NOTE — Progress Notes (Signed)
IV taken out. Pt. Transported via [facility]. Report called to [facility]. No questions/complaints.

## 2016-12-07 NOTE — Clinical Social Work Note (Signed)
Clinical Social Worker facilitated patient discharge including contacting patient family and facility to confirm patient discharge plans.  Clinical information faxed to facility and family agreeable with plan.  CSW arranged ambulance transport via [facility] to [facility].  RN to call report prior to discharge.  Clinical Social Worker will sign off for now as social work intervention is no longer needed. Please consult us again if new need arises.  Jesse Talibah Colasurdo, LCSW [phone] 

## 2016-12-07 NOTE — Progress Notes (Signed)
Physical Therapy Treatment Patient Details Name: Jacob [name] MRN: [id number] DOB: Nov 17, 1916 Today's Date: 12/07/2016    History of Present Illness Pt is a 83 yo male admitted with R hip pain s/p fall. dx with R acetabular fx and R wrist fx. PMH significant for L MCA stroke, CABG, CAD, and chronic A fib     PT Comments    Pt very lethargic from sedative pain medication today. Speaking through an interpretor pt began to initiate movement to sit EoB but then stopped due to pain in R knee. Pt able to perform bed exercises before getting so sleepy he could not continue. Pt requires continued skilled PT to progress his bed mobility and transfers and to improve his LE strength to work towards safe mobility in his environment.    Follow Up Recommendations        Equipment Recommendations   (TBD at next venue)    Recommendations for Other Services OT consult     Precautions / Restrictions Precautions Precautions: Fall Restrictions Weight Bearing Restrictions: Yes RUE Weight Bearing: Non weight bearing RLE Weight Bearing: Non weight bearing    Mobility  Bed Mobility   Bed Mobility: Supine to Sit           General bed mobility comments: Pt began to move L LE toward EoB but stopped with complaints of his L knee hurting.       Balance Overall balance assessment: History of Falls                                          Cognition Arousal/Alertness: Lethargic;Suspect due to medications Behavior During Therapy: Restless Overall Cognitive Status: Impaired/Different from baseline Area of Impairment: Following commands;Safety/judgement;Awareness;Attention                   Current Attention Level: Selective   Following Commands: Follows one step commands inconsistently Safety/Judgement: Decreased awareness of safety;Decreased awareness of deficits Awareness: Intellectual   General Comments: Daughter reports that he had been given a sedative this  morning due to his earlier confusion and agitation      Exercises General Exercises - Lower Extremity Ankle Circles/Pumps: Both;5 reps;Supine;AAROM;AROM Heel Slides: 5 reps;AAROM;Left;Supine    General Comments General comments (skin integrity, edema, etc.): Daughter and wife present for session. Session greatly benefited from spanish interpreter      Pertinent Vitals/Pain Pain Assessment: Faces Faces Pain Scale: Hurts little more Pain Location: R hip and R wrist Pain Descriptors / Indicators: Grimacing;Guarding Pain Intervention(s): Monitored during session           PT Goals (current goals can now be found in the care plan section) Acute Rehab PT Goals Patient Stated Goal: Decrease pain PT Goal Formulation: With patient/family Time For Goal Achievement: 12/19/16 Potential to Achieve Goals: Fair Progress towards PT goals: PT to reassess next treatment    Frequency    Min 3X/week      PT Plan Current plan remains appropriate       End of Session   Activity Tolerance: Patient limited by pain;Patient limited by lethargy Patient left: in bed;with call bell/phone within reach;with bed alarm set;with family/visitor present Nurse Communication: Mobility status PT Visit Diagnosis: Other abnormalities of gait and mobility (R26.89);Pain Pain - Right/Left: Right Pain - part of body: Hip;Hand     Time: 4098-1191 PT Time Calculation (min) (ACUTE ONLY): 17 min  Charges:  $  Therapeutic Activity: 8-22 mins                    G Codes:       Angelisa Winthrop B. Beverely Risen PT, DPT Acute Rehabilitation  [phone] Pager [phone]    [name] 12/07/2016, 3:23 PM

## 2016-12-07 NOTE — Plan of Care (Signed)
Problem: Education: Goal: Knowledge of [facility] Education information/materials will improve Outcome: Progressing Tried to review POC with pt.- has dementia and language barrier.

## 2016-12-07 NOTE — Care Management Note (Signed)
Case Management Note  Patient Details  Name: Jacob [name] MRN: [id number] Date of Birth: 1917-03-26  Subjective/Objective:   Pt admitted on 12/02/16 s/p fall with Rt acetabular fracture and a Rt radius fracture.  PTA, pt resided at home with wife and daughter/son in [place].                  Action/Plan: Met with pt's family at bedside.  Spoke with daughter, Inez Catalina. She is awaiting discussion with CSW to determine long term options for pt, as she states they will not be able to take pt home.  Pt has been at [facility] in the past, and family open to him going back there.  CSW has been consulted.    Expected Discharge Date:  12/07/16               Expected Discharge Plan:  Skilled Nursing Facility  In-House Referral:  Clinical Social Work  Discharge planning Services  CM Consult  Post Acute Care Choice:    Choice offered to:     DME Arranged:    DME Agency:     HH Arranged:    [place] Agency:     Status of Service:  Completed, signed off  If discussed at [employer], dates discussed:    Additional Comments:  12/07/16 J. Dquan Cortopassi, RN, BSN Pt medically stable for discharge today.  Plan dc to [facility], per CSW arrangements.    Reinaldo Raddle, RN, BSN  Trauma/Neuro ICU Case Manager [phone]

## 2016-12-07 NOTE — Discharge Summary (Signed)
[facility] Discharge Summary   Patient ID: MAAZ SPIERING MRN: [id number] DOB/AGE: 81-25-1918 83 y.o.  Admit date: 12/01/2016 Discharge date: 12/07/2016  Admitting Diagnosis: Fall Right distal radius and ulnar styloid fracture Right acetabular fracture with small associated hematoma in the pelvis DNR/no surgery per family  Discharge Diagnosis Patient Active Problem List   Diagnosis Date Noted  . Pressure injury of skin 12/05/2016  . Acetabular fracture (HCC) 12/02/2016  . Distal radius fracture, right 12/02/2016  . Fall   . Urge urinary incontinence 10/06/2016  . Visual hallucinations 08/30/2016  . History of cardioembolic cerebrovascular accident (CVA) 10/28/2015  . Anemia 09/24/2015  . Nocturia associated with benign prostatic hyperplasia 02/24/2015  . Hypothyroidism 08/02/2014  . Dysphagia 08/02/2014  . Essential hypertension 05/23/2013  . CAD in native artery -->  CABG x4 in 1994   . 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 Dyslipidemia, goal LDL below 70 - on statin, followed by primary physician.   . Chronic atrial fibrillation (HCC) 12/15/2012    Class: Chronic    Consultants Annell Greening MD - orthopedics  Imaging: CT head and cervical spine wo contrast 12/01/16: 1. No acute intracranial findings. Prior left frontal infarction with encephalomalacia. There is moderate generalized atrophy and chronic appearing white matter hypodensities which likely represent small vessel ischemic disease. 2. Negative for acute cervical spine fracture.  CT abdomen pelvis and chest w contrast 12/01/16: 1. New anterior column fracture of the right acetabulum with associated small amount of rightsided hemorrhage and edema in the extraperitoneal retropubic space of Retzius. 2. Cardiomegaly with 3 vessel coronary arteriosclerosis and ectasia of the ascending aorta. Aortic atherosclerosis. 3. Chronic T4 and T7 compression fractures. 4.  Uncomplicated cholelithiasis. 5. Bilateral renal cysts and cortical scarring of the left kidney. 6. Nonobstructing bladder calculus. Eccentric right-sided bladder wall thickening possibly from underdistention and mass-effect from the extraperitoneal hemorrhage fluid. Asymmetric cystitis/mucosal inflammation is not excluded.  DG wrist complete right 12/02/16: Fractures of the distal radius and ulnar styloid.  Procedures None  Hospital Course:  Jacob [name] is a 83yo male PMH dementia who was [facility]01/16 via EMS after sustaining a ground level fall. Patient was on Eliquis for atrial fibrillation.  Workup showed Right acetabular fracture and Right distal radius and ulnar styloid fracture.  Ortho was consulted for the above injuries and recommended nonoperative management of both acute fractures. He was placed in a right short arm splint, and kept on bedrest x2 days prior to beginning bed to recliner transfers. He will be NWB RLE x6 weeks.  His pain and appetite gradually improved. Patient worked with PT during this admission. On 4/11 the patient was voiding well, tolerating diet, workign well with therapy, pain well controlled, vital signs stable and felt stable for discharge to SNF.  He will remain off eliquis indefinitely. Patient will follow up with orthopedics in 2 weeks and knows to call with questions or concerns.    I have personally reviewed the patients medication history on the [place] controlled substance database.   Physical Exam: Gen: Alert, NAD, confused Card: Irregularly irregular, 2+DP bilaterally Pulm: CTAB, no W/R/R, effort normal Abd: Soft, NT/ND, +BS, no HSM, no hernia RUE: in splint, BUE in mittens BLE: No erythema oredema; calves nontender; right hip tender  Allergies as of 12/07/2016   No Known Allergies     Medication List    STOP taking these medications  ELIQUIS 2.5 MG Tabs tablet Generic drug:  apixaban     TAKE these medications   dorzolamide 2  % ophthalmic solution Commonly known as:  TRUSOPT Place 1 drop into both eyes 2 (two) times daily.   feeding supplement (ENSURE ENLIVE) Liqd Take 237 mLs by mouth 3 (three) times daily between meals.   finasteride 5 MG tablet Commonly known as:  PROSCAR Take 1 tablet (5 mg total) by mouth daily.   latanoprost 0.005 % ophthalmic solution Commonly known as:  XALATAN Place 1 drop into both eyes at bedtime.   levothyroxine 88 MCG tablet Commonly known as:  SYNTHROID, LEVOTHROID TAKE 1 TABLET (88 MCG TOTAL) BY MOUTH DAILY BEFORE BREAKFAST.   LORazepam 1 MG tablet Commonly known as:  ATIVAN Take 1 mg by mouth at bedtime.   mirabegron ER 25 MG Tb24 tablet Commonly known as:  MYRBETRIQ Take 1 tablet (25 mg total) by mouth daily.   pantoprazole 40 MG tablet Commonly known as:  PROTONIX Take 1 tablet (40 mg total) by mouth daily.   polyethylene glycol packet Commonly known as:  MIRALAX / GLYCOLAX Take 17 g by mouth daily as needed for moderate constipation.   simvastatin 20 MG tablet Commonly known as:  ZOCOR TAKE 1 TABLET (20 MG TOTAL) BY MOUTH DAILY.   tamsulosin 0.4 MG Caps capsule Commonly known as:  FLOMAX Take 2 capsules (0.8 mg total) by mouth daily after supper.   traMADol 50 MG tablet Commonly known as:  ULTRAM Take 1 tablet (50 mg total) by mouth every 6 (six) hours as needed for moderate pain.        Follow-up Information    Eldred Manges, MD Follow up in 2 week(s).   Specialty:  Orthopedic Surgery Contact information: [address] [place] [address] [phone]           Signed: Edson Snowball, [facility] 12/07/2016, 8:30 AM Pager: [phone] Consults: [phone] Mon-Fri 7:00 am-4:30 pm Sat-Sun 7:00 am-11:30 am

## 2016-12-09 ENCOUNTER — Non-Acute Institutional Stay (SKILLED_NURSING_FACILITY): Payer: PPO | Admitting: Internal Medicine

## 2016-12-09 ENCOUNTER — Encounter: Payer: Self-pay | Admitting: Internal Medicine

## 2016-12-09 DIAGNOSIS — S52531A Colles' fracture of right radius, initial encounter for closed fracture: Secondary | ICD-10-CM | POA: Diagnosis not present

## 2016-12-09 DIAGNOSIS — F419 Anxiety disorder, unspecified: Secondary | ICD-10-CM

## 2016-12-09 DIAGNOSIS — R531 Weakness: Secondary | ICD-10-CM | POA: Diagnosis not present

## 2016-12-09 DIAGNOSIS — D638 Anemia in other chronic diseases classified elsewhere: Secondary | ICD-10-CM

## 2016-12-09 DIAGNOSIS — E43 Unspecified severe protein-calorie malnutrition: Secondary | ICD-10-CM

## 2016-12-09 DIAGNOSIS — K219 Gastro-esophageal reflux disease without esophagitis: Secondary | ICD-10-CM

## 2016-12-09 DIAGNOSIS — I482 Chronic atrial fibrillation, unspecified: Secondary | ICD-10-CM

## 2016-12-09 DIAGNOSIS — E039 Hypothyroidism, unspecified: Secondary | ICD-10-CM

## 2016-12-09 DIAGNOSIS — F0390 Unspecified dementia without behavioral disturbance: Secondary | ICD-10-CM

## 2016-12-09 DIAGNOSIS — N401 Enlarged prostate with lower urinary tract symptoms: Secondary | ICD-10-CM

## 2016-12-09 DIAGNOSIS — E785 Hyperlipidemia, unspecified: Secondary | ICD-10-CM

## 2016-12-09 DIAGNOSIS — D696 Thrombocytopenia, unspecified: Secondary | ICD-10-CM

## 2016-12-09 DIAGNOSIS — I2510 Atherosclerotic heart disease of native coronary artery without angina pectoris: Secondary | ICD-10-CM

## 2016-12-09 DIAGNOSIS — L89159 Pressure ulcer of sacral region, unspecified stage: Secondary | ICD-10-CM | POA: Diagnosis not present

## 2016-12-09 DIAGNOSIS — S32401S Unspecified fracture of right acetabulum, sequela: Secondary | ICD-10-CM

## 2016-12-09 DIAGNOSIS — R2681 Unsteadiness on feet: Secondary | ICD-10-CM | POA: Diagnosis not present

## 2016-12-09 DIAGNOSIS — R350 Frequency of micturition: Secondary | ICD-10-CM

## 2016-12-09 NOTE — Progress Notes (Signed)
LOCATION: [facility]  PCP: Eustaquio Boyden, MD   Code Status: DNR  Goals of care: Advanced Directive information Advanced Directives 12/09/2016  Does Patient Have a Medical Advance Directive? Yes  Type of Advance Directive Out of facility DNR (pink MOST or yellow form)  Does patient want to make changes to medical advance directive? No - Patient declined  Would patient like information on creating a medical advance directive? -       Extended Emergency Contact Information Primary Emergency Contact: Earl Lites Address: [address]          [place], [place] [address] [place] Phone: [phone] Relation: Daughter Secondary Emergency Contact: Leory Plowman [place] of [place] Mobile Phone: [phone] Relation: Relative   No Known Allergies  Chief Complaint  Patient presents with  . New Admit To SNF    New Admission Visit     HPI:  Patient is a 83 y.o. male seen today for short term rehabilitation post hospital admission from 12/01/2016-12/07/2016 post fall with right distal radius and ulnar styloid fracture and right acetabular fracture with a small hematoma to his pelvis. He was seen by orthopedic and non- operative, medical management was recommended given his goals of care. He is seen in his room today. He has medical history of stroke, coronary artery disease status post CABG, dementia, atrial fibrillation, hypertension, hypothyroidism among others.  Review of Systems: Unable to obtain with his dementia    Past Medical History:  Diagnosis Date  . Acetabular fracture (HCC) 12/01/2016  . Acute ischemic left MCA stroke (HCC) 10/2015   "cognitive deficits mostly" (12/05/2016)  . Acute right MCA stroke (HCC) 09/23/2015  . Anemia   . Arthritis    "all over" (12/05/2016)  . C4 cervical fracture (HCC) 2015   with spinal cord contusion - stopped coumadin.  Marland Kitchen CAD in native artery -->  1994   referred for CABG  . Chronic atrial  fibrillation (HCC) 12/15/2012  . Diverticulitis   . Dyslipidemia, goal LDL below 70[272.4]     - on statin, followed by primary physician.   . Essential hypertension 05/23/2013  . Glaucoma   . H/O seasonal allergies   . Hip fracture (HCC) 12/15/2015  . History of cardioembolic cerebrovascular accident (CVA) 10/28/2015  . History of kidney stones   . History of stomach ulcers   . Hypothyroidism   . Long term (current) use of anticoagulants 12/15/2012   warfarin  . Neck fracture (HCC) 2015   "no OR"  . S/P CABG x 4 1994   a) 1994: LIMA-LAD, SVG-RI, SVG-OM, SVG-RCA; b) 09/2006, Persantine Myoview: fixed inferior defect consistent with infarct/scar without peri-infarct ischemia;; 2-D echo: Mild-mod concentric LVH. Normal EF.  Abnormal relaxation, aortic sclerosis with mild-mod AI   Past Surgical History:  Procedure Laterality Date  . APPENDECTOMY    . CARDIAC CATHETERIZATION    . CATARACT EXTRACTION W/ INTRAOCULAR LENS  IMPLANT, BILATERAL Bilateral   . CORONARY ARTERY BYPASS GRAFT  1994   LIMA-LAD, SVG-RI, SVG-OM, SVG-RCA   . FEMUR SURGERY Left 2000   replacement- due to fall  . FINGER AMPUTATION Left 1985   "accident at work; ring finger amputation"  . FRACTURE SURGERY    . HIP PINNING,CANNULATED Right 12/16/2015   Procedure: CANNULATED HIP PINNING;  Surgeon: Deeann Saint, MD;  Location: [facility] ORS;  Service: Orthopedics;  Laterality: Right;  . Persantine Myoview  February 2008   Fixed inferior wall defect-consistent with infarct/scar without peri-infarct ischemia  .  TRANSTHORACIC ECHOCARDIOGRAM  February 2008   Mild/mod conc LVH, Nl Size & Fxn, Gr 1 DD(abnormal relaxation), mild to moderate aortic regurgitation and mildly sclerotic aortic valve.   Social History:   reports that he has never smoked. He has never used smokeless tobacco. He reports that he drinks alcohol. He reports that he does not use drugs.  Family History  Problem Relation Age of Onset  . Liver cancer Mother   .  Prostate cancer Father     Medications: Allergies as of 12/09/2016   No Known Allergies     Medication List       Accurate as of 12/09/16 11:08 AM. Always use your most recent med list.          DECUBI-VITE PO Take 1 capsule by mouth daily.   dorzolamide 2 % ophthalmic solution Commonly known as:  TRUSOPT Place 1 drop into both eyes 2 (two) times daily.   finasteride 5 MG tablet Commonly known as:  PROSCAR Take 1 tablet (5 mg total) by mouth daily.   latanoprost 0.005 % ophthalmic solution Commonly known as:  XALATAN Place 1 drop into both eyes at bedtime.   levothyroxine 88 MCG tablet Commonly known as:  SYNTHROID, LEVOTHROID TAKE 1 TABLET (88 MCG TOTAL) BY MOUTH DAILY BEFORE BREAKFAST.   LORazepam 1 MG tablet Commonly known as:  ATIVAN Take 1 mg by mouth at bedtime.   mirabegron ER 25 MG Tb24 tablet Commonly known as:  MYRBETRIQ Take 1 tablet (25 mg total) by mouth daily.   pantoprazole 40 MG tablet Commonly known as:  PROTONIX Take 1 tablet (40 mg total) by mouth daily.   polyethylene glycol packet Commonly known as:  MIRALAX / GLYCOLAX Take 17 g by mouth daily as needed for moderate constipation.   simvastatin 20 MG tablet Commonly known as:  ZOCOR TAKE 1 TABLET (20 MG TOTAL) BY MOUTH DAILY.   tamsulosin 0.4 MG Caps capsule Commonly known as:  FLOMAX Take 2 capsules (0.8 mg total) by mouth daily after supper.   traMADol 50 MG tablet Commonly known as:  ULTRAM Take 1 tablet (50 mg total) by mouth every 6 (six) hours as needed for moderate pain.   UNABLE TO FIND Med Name: Med pass 90 mL by mouth 3 times daily       Immunizations: Immunization History  Administered Date(s) Administered  . Influenza, High Dose Seasonal PF 06/23/2015  . Influenza,inj,Quad PF,36+ Mos 08/30/2016  . Influenza-Unspecified 05/29/2014  . PPD Test 12/07/2016     Physical Exam:  Vitals:   12/09/16 1103  BP: 138/88  Pulse: 75  Resp: 18  Temp: 98 F (36.7 C)    TempSrc: Oral  SpO2: 96%  Weight: 153 lb 8 oz (69.6 kg)  Height: 5\' 6"  (1.676 m)   Body mass index is 24.78 kg/m.  General- Elderly male, thin built, frail, in no acute distress Head- normocephalic, atraumatic Nose- no maxillary or frontal sinus tenderness, no nasal discharge Throat- moist mucus membrane, normal oropharynx, missing teeth  Eyes- PERRLA, EOMI, no pallor, no icterus, no discharge, normal conjunctiva, normal sclera Neck- no cervical lymphadenopathy Cardiovascular- irregular heart rate, no murmur Respiratory- bilateral clear to auscultation, no wheeze, no rhonchi, no crackles, no use of accessory muscles Abdomen- bowel sounds present, soft, non tender, no guarding or rigidity Musculoskeletal- able to move all 4 extremities, right arm with cost and Ace wrap in place, able to move his fingers with good capillary refill, trace leg edema Neurological- alert and oriented  to self only, pleasantly confused Skin- warm and dry    Labs reviewed: Basic Metabolic Panel:  Recent Labs  16/10/96 0427 12/04/16 0311 12/05/16 0724  NA 139 138 141  K 4.1 4.4 4.3  CL 107 109 109  CO2 21* 22 21*  GLUCOSE 103* 102* 99  BUN 44* 39* 29*  CREATININE 1.71* 1.48* 1.30*  CALCIUM 8.9 8.3* 8.5*   Liver Function Tests:  Recent Labs  08/30/16 1733 12/01/16 2223  AST 20 29  ALT 13 22  ALKPHOS 94 88  BILITOT 0.3 0.6  PROT 7.8 7.4  ALBUMIN 3.9 3.4*   No results for input(s): LIPASE, AMYLASE in the last 8760 hours. No results for input(s): AMMONIA in the last 8760 hours. CBC:  Recent Labs  12/15/15 1207  08/30/16 1733 12/01/16 2223  12/03/16 0427 12/04/16 0311 12/05/16 0724  WBC 18.5*  < > 8.1 13.9*  < > 11.5* 10.0 8.1  NEUTROABS 17.7*  --  6.5 13.0*  --   --   --   --   HGB 11.2*  < > 11.5* 10.7*  < > 8.1* 7.9* 8.1*  HCT 32.7*  < > 34.0* 31.5*  < > 24.3* 23.5* 24.0*  MCV 94.6  < > 93.4 94.3  < > 92.7 93.6 94.5  PLT 210  < > 238.0 229  < > 139* 127* 131*  < > = values  in this interval not displayed. Cardiac Enzymes:  Recent Labs  12/15/15 1207  TROPONINI 0.07*   BNP: Invalid input(s): POCBNP CBG:  Recent Labs  12/15/15 2326  GLUCAP 132*    Radiological Exams: Dg Chest 1 View  Result Date: 12/01/2016 CLINICAL DATA:  Ground level fall at home tonight, at 19:40. EXAM: CHEST 1 VIEW COMPARISON:  11/20/2015 FINDINGS: Stable moderate cardiomegaly. Prior sternotomy and CABG. The lungs are clear. The pulmonary vasculature is normal. There is no pneumothorax. No effusion. Hilar and mediastinal contours are unremarkable and unchanged. The bones appear osteopenic, but no displaced fractures are evident. Stable healed fracture deformity of the left fifth rib. IMPRESSION: No acute findings. Electronically Signed   By: Ellery Plunk M.D.   On: 12/01/2016 22:36   Dg Wrist Complete Right  Result Date: 12/02/2016 CLINICAL DATA:  Larey Seat at home tonight EXAM: RIGHT WRIST - COMPLETE 3+ VIEW COMPARISON:  None. FINDINGS: There is a transverse fracture of the distal radius, intra-articular at the ulnar aspect of the radiocarpal articulation. There is a transverse fracture of the ulnar styloid. No dislocation. IMPRESSION: Fractures of the distal radius and ulnar styloid. Electronically Signed   By: Ellery Plunk M.D.   On: 12/02/2016 00:56   Ct Head Wo Contrast  Result Date: 12/02/2016 CLINICAL DATA:  Larey Seat at home tonight. EXAM: CT HEAD WITHOUT CONTRAST CT CERVICAL SPINE WITHOUT CONTRAST TECHNIQUE: Multidetector CT imaging of the head and cervical spine was performed following the standard protocol without intravenous contrast. Multiplanar CT image reconstructions of the cervical spine were also generated. COMPARISON:  12/15/2015 FINDINGS: CT HEAD FINDINGS Brain: There is no intracranial hemorrhage, mass or evidence of acute infarction. There is prior left frontal infarction with encephalomalacia. There is moderate generalized atrophy. There is moderate chronic  microvascular ischemic change. There is no significant extra-axial fluid collection. No acute intracranial findings are evident. Vascular: No hyperdense vessel or unexpected calcification. Skull: Normal. Negative for fracture or focal lesion. Sinuses/Orbits: No acute finding. Other: None. CT CERVICAL SPINE FINDINGS Alignment: Normal. Skull base and vertebrae: No acute fracture. No primary bone  lesion or focal pathologic process. Soft tissues and spinal canal: No prevertebral fluid or swelling. No visible canal hematoma. Disc levels:  Moderate cervical degenerative disc and facet changes. Upper chest: Negative. Other: None IMPRESSION: 1. No acute intracranial findings. Prior left frontal infarction with encephalomalacia. There is moderate generalized atrophy and chronic appearing white matter hypodensities which likely represent small vessel ischemic disease. 2. Negative for acute cervical spine fracture. Electronically Signed   By: Ellery Plunk M.D.   On: 12/02/2016 00:37   Ct Chest W Contrast  Result Date: 12/02/2016 CLINICAL DATA:  Pain after fall EXAM: CT CHEST, ABDOMEN, AND PELVIS WITH CONTRAST TECHNIQUE: Multidetector CT imaging of the chest, abdomen and pelvis was performed following the standard protocol during bolus administration of intravenous contrast. CONTRAST:  75mL ISOVUE-300 IOPAMIDOL (ISOVUE-300) INJECTION 61% COMPARISON:  Pelvic CT from 12/15/2015, chest radiograph from 11/20/2015 FINDINGS: CT CHEST FINDINGS Cardiovascular: Cardiomegaly without pericardial effusion. Three-vessel coronary arteriosclerosis is visualized. There is aortic atherosclerosis with ectasia of the ascending aorta measuring up to 3.8 cm. No dissection is noted. The patient is status post CABG. Atherosclerosis at the origins of the great vessels. Mediastinum/Nodes: No enlarged mediastinal, hilar, or axillary lymph nodes. Thyroid gland, trachea, and esophagus demonstrate no significant findings. Lungs/Pleura: There is  dependent bibasilar atelectasis. No pulmonary consolidation or pneumothorax. No pleural effusion. Musculoskeletal: Rotator cuff calcific tendinopathy about the right shoulder. No acute fracture of the ribs. Chronic moderate compression of T4 without retropulsion. Mild inferior endplate compression of T7 also chronic in appearance. CT ABDOMEN PELVIS FINDINGS Hepatobiliary: Cholelithiasis without complication. Homogeneous enhancement of the liver without biliary dilatation. Pancreas: Atrophic pancreas without ductal dilatation or mass. Spleen: No splenomegaly or mass. Adrenals/Urinary Tract: Normal bilateral adrenal glands. Variable size cysts of both kidneys some are too small to further characterize. No obstructive uropathy. Cortical scarring of left kidney. Bladder stone is noted along the dependent wall measuring 15 mm. There is mural thickening of the bladder on the right, query inflammation or thickening due to mass effect from adjacent small pelvic side wall hemorrhage and edema. Ectasia of the left ureter without definite obstruction. Bilateral extrarenal pelves are noted. Stomach/Bowel: Stomach is within normal limits. Appendix appears normal. No evidence of bowel wall thickening, distention, or inflammatory changes. Vascular/Lymphatic: Aortic and side branch atherosclerosis. No enlarged abdominal or pelvic lymph nodes. Reproductive: Prostate is unremarkable. Other: No abdominal wall hernia or abnormality. No abdominopelvic ascites. Musculoskeletal: Coronal fracture through the acetabular roof, anteromedial wall of the acetabulum extending into the right iliac bone consistent with an anterior column fracture of the acetabulum. Associated small amount extraperitoneal hemorrhage and edema along the right obturator internus and retro pubic space of Retzius causing slight mass effect on the adjacent bladder. Chronic moderate L2 fracture. ORIF of both proximal femora. IMPRESSION: 1. New anterior column fracture of  the right acetabulum with associated small amount of rightsided hemorrhage and edema in the extraperitoneal retropubic space of Retzius. 2. Cardiomegaly with 3 vessel coronary arteriosclerosis and ectasia of the ascending aorta. Aortic atherosclerosis. 3. Chronic T4 and T7 compression fractures. 4. Uncomplicated cholelithiasis. 5. Bilateral renal cysts and cortical scarring of the left kidney. 6. Nonobstructing bladder calculus. Eccentric right-sided bladder wall thickening possibly from underdistention and mass-effect from the extraperitoneal hemorrhage fluid. Asymmetric cystitis/mucosal inflammation is not excluded. Electronically Signed   By: Tollie Eth M.D.   On: 12/02/2016 01:25   Ct Cervical Spine Wo Contrast  Result Date: 12/02/2016 CLINICAL DATA:  Larey Seat at home tonight. EXAM: CT HEAD WITHOUT  CONTRAST CT CERVICAL SPINE WITHOUT CONTRAST TECHNIQUE: Multidetector CT imaging of the head and cervical spine was performed following the standard protocol without intravenous contrast. Multiplanar CT image reconstructions of the cervical spine were also generated. COMPARISON:  12/15/2015 FINDINGS: CT HEAD FINDINGS Brain: There is no intracranial hemorrhage, mass or evidence of acute infarction. There is prior left frontal infarction with encephalomalacia. There is moderate generalized atrophy. There is moderate chronic microvascular ischemic change. There is no significant extra-axial fluid collection. No acute intracranial findings are evident. Vascular: No hyperdense vessel or unexpected calcification. Skull: Normal. Negative for fracture or focal lesion. Sinuses/Orbits: No acute finding. Other: None. CT CERVICAL SPINE FINDINGS Alignment: Normal. Skull base and vertebrae: No acute fracture. No primary bone lesion or focal pathologic process. Soft tissues and spinal canal: No prevertebral fluid or swelling. No visible canal hematoma. Disc levels:  Moderate cervical degenerative disc and facet changes. Upper chest:  Negative. Other: None IMPRESSION: 1. No acute intracranial findings. Prior left frontal infarction with encephalomalacia. There is moderate generalized atrophy and chronic appearing white matter hypodensities which likely represent small vessel ischemic disease. 2. Negative for acute cervical spine fracture. Electronically Signed   By: Ellery Plunk M.D.   On: 12/02/2016 00:37   Ct Abdomen Pelvis W Contrast  Result Date: 12/02/2016 CLINICAL DATA:  Pain after fall EXAM: CT CHEST, ABDOMEN, AND PELVIS WITH CONTRAST TECHNIQUE: Multidetector CT imaging of the chest, abdomen and pelvis was performed following the standard protocol during bolus administration of intravenous contrast. CONTRAST:  75mL ISOVUE-300 IOPAMIDOL (ISOVUE-300) INJECTION 61% COMPARISON:  Pelvic CT from 12/15/2015, chest radiograph from 11/20/2015 FINDINGS: CT CHEST FINDINGS Cardiovascular: Cardiomegaly without pericardial effusion. Three-vessel coronary arteriosclerosis is visualized. There is aortic atherosclerosis with ectasia of the ascending aorta measuring up to 3.8 cm. No dissection is noted. The patient is status post CABG. Atherosclerosis at the origins of the great vessels. Mediastinum/Nodes: No enlarged mediastinal, hilar, or axillary lymph nodes. Thyroid gland, trachea, and esophagus demonstrate no significant findings. Lungs/Pleura: There is dependent bibasilar atelectasis. No pulmonary consolidation or pneumothorax. No pleural effusion. Musculoskeletal: Rotator cuff calcific tendinopathy about the right shoulder. No acute fracture of the ribs. Chronic moderate compression of T4 without retropulsion. Mild inferior endplate compression of T7 also chronic in appearance. CT ABDOMEN PELVIS FINDINGS Hepatobiliary: Cholelithiasis without complication. Homogeneous enhancement of the liver without biliary dilatation. Pancreas: Atrophic pancreas without ductal dilatation or mass. Spleen: No splenomegaly or mass. Adrenals/Urinary Tract:  Normal bilateral adrenal glands. Variable size cysts of both kidneys some are too small to further characterize. No obstructive uropathy. Cortical scarring of left kidney. Bladder stone is noted along the dependent wall measuring 15 mm. There is mural thickening of the bladder on the right, query inflammation or thickening due to mass effect from adjacent small pelvic side wall hemorrhage and edema. Ectasia of the left ureter without definite obstruction. Bilateral extrarenal pelves are noted. Stomach/Bowel: Stomach is within normal limits. Appendix appears normal. No evidence of bowel wall thickening, distention, or inflammatory changes. Vascular/Lymphatic: Aortic and side branch atherosclerosis. No enlarged abdominal or pelvic lymph nodes. Reproductive: Prostate is unremarkable. Other: No abdominal wall hernia or abnormality. No abdominopelvic ascites. Musculoskeletal: Coronal fracture through the acetabular roof, anteromedial wall of the acetabulum extending into the right iliac bone consistent with an anterior column fracture of the acetabulum. Associated small amount extraperitoneal hemorrhage and edema along the right obturator internus and retro pubic space of Retzius causing slight mass effect on the adjacent bladder. Chronic moderate L2 fracture. ORIF of  both proximal femora. IMPRESSION: 1. New anterior column fracture of the right acetabulum with associated small amount of rightsided hemorrhage and edema in the extraperitoneal retropubic space of Retzius. 2. Cardiomegaly with 3 vessel coronary arteriosclerosis and ectasia of the ascending aorta. Aortic atherosclerosis. 3. Chronic T4 and T7 compression fractures. 4. Uncomplicated cholelithiasis. 5. Bilateral renal cysts and cortical scarring of the left kidney. 6. Nonobstructing bladder calculus. Eccentric right-sided bladder wall thickening possibly from underdistention and mass-effect from the extraperitoneal hemorrhage fluid. Asymmetric cystitis/mucosal  inflammation is not excluded. Electronically Signed   By: Tollie Eth M.D.   On: 12/02/2016 01:25   Dg Hip Unilat W Or Wo Pelvis 2-3 Views Right  Result Date: 12/01/2016 CLINICAL DATA:  Persistent pain after ground level fall at home tonight at 19:40 EXAM: DG HIP (WITH OR WITHOUT PELVIS) 2-3V RIGHT COMPARISON:  12/18/2015 FINDINGS: Four parallel fixation screws appear unchanged in the right hip. Unchanged intramedullary left femoral nail with interlocking dynamic hip screw. No evidence of hardware failure. No evidence of acute fracture. No dislocation. The bones are osteopenic, decreasing sensitivity for nondisplaced fractures. IMPRESSION: Negative for acute fracture or dislocation. Fixation hardware appears intact. Electronically Signed   By: Ellery Plunk M.D.   On: 12/01/2016 22:38    Assessment/Plan  Unsteady gait Post fall with fractures as below.Will have him work with physical therapy and occupational therapy team to help with gait training and muscle strengthening exercises.fall precautions. Skin care. Encourage to be out of bed.   Right distal radial and ulna fracture Medical management for now. Continue tramadol 50 mg every 6 hours as needed for pain. Has cast present. Get orthopedic follow-up  Right acetabular fracture Nonweightbearing to right lower extremity for 6 weeks for now per discharge summary.Will have patient work with PT/OT as tolerated to regain strength and restore function.  Fall precautions are in place. Orthopedic follow-up will be made.  Generalized weakness From deconditioning. Here for rehabilitation. Code of care is for comfort care. Patient appears comfortable at present. Monitor clinically.  Severe protein calorie malnutrition To provide assistance with feeding. Continue med Pass and pro-stat supplement. Registered dietitian to follow. Monitor weekly weight for now.   Sacral pressure ulcer With deep tissue injury. Provide wound care for now. Pressure ulcer  prophylaxis to be taken. Continue decubivite.   Dementia without behavioral disturbance Provide supportive care for now. Fall precautions and pressure ulcer prophylaxis to be taken. Assistance with feeding to be provided and aspiration precautions to be taken  Anemia of chronic disease Monitor CBC  Thrombocytopenia No plate has been reported. Monitor platelet count.  Renal impairment  Anxiety Currently on lorazepam 1 mg daily at bedtime, increased risk for fall and benzodiazepine can worsen it. Monitor clinically. pysch consult  Coronary artery disease Remains chest pain-free. Continue simvastatin 20 mg daily and monitor  Hypothyroidism Continue levothyroxine 88 g daily and monitor.  Hyperlipidemia Continue simvastatin 20 mg daily and monitor.  GERD Continue pantoprazole 40 mg daily  Atrial fibrillation Controlled heart rate. Currently off all medications. Monitor clinically.  BPH with symptoms present Continue Flomax and finasteride, no changes made. Continue myrbetriq for increased urinary frequency and monitor   Goals of care: short term rehabilitation, possible long-term care   Labs/tests ordered: CBC, BMP 12/12/2016  Family/ staff Communication: reviewed care plan with patient and nursing supervisor  I have spent greater than 50 minutes for this encounter which includes reviewing hospital records, addressing above mentioned concerns, reviewing care plan with patient's charge nurse.  Blanchie Serve, MD Internal Medicine [facility] [address] [place], [place] [address] Cell Phone (Monday-Friday 8 am - 5 pm): [phone] On Call: [phone] and follow prompts after 5 pm and on weekends Office Phone: [phone] Office Fax: [phone]

## 2016-12-12 ENCOUNTER — Encounter: Payer: Self-pay | Admitting: Family

## 2016-12-12 ENCOUNTER — Non-Acute Institutional Stay (SKILLED_NURSING_FACILITY): Payer: PPO | Admitting: Family

## 2016-12-12 ENCOUNTER — Telehealth (INDEPENDENT_AMBULATORY_CARE_PROVIDER_SITE_OTHER): Payer: Self-pay | Admitting: Orthopaedic Surgery

## 2016-12-12 DIAGNOSIS — M79604 Pain in right leg: Secondary | ICD-10-CM | POA: Diagnosis not present

## 2016-12-12 DIAGNOSIS — D72829 Elevated white blood cell count, unspecified: Secondary | ICD-10-CM | POA: Diagnosis not present

## 2016-12-12 DIAGNOSIS — E870 Hyperosmolality and hypernatremia: Secondary | ICD-10-CM

## 2016-12-12 DIAGNOSIS — M79601 Pain in right arm: Secondary | ICD-10-CM | POA: Diagnosis not present

## 2016-12-12 MED ORDER — ACETAMINOPHEN 500 MG PO TABS: 1000.0000 mg | ORAL_TABLET | Freq: Three times a day (TID) | ORAL | 0 refills | Status: AC

## 2016-12-12 NOTE — Telephone Encounter (Signed)
Patient daughter Kathie Rhodes called asked if the appointment date for her father can be faxed to her. The fax # is [phone]. The number to contact Kathie Rhodes is [phone]

## 2016-12-12 NOTE — Telephone Encounter (Signed)
Faxed appt time and date to daughter.

## 2016-12-12 NOTE — Progress Notes (Signed)
Location:  [facility] Room Number: 509 Place of Service:  SNF [id number]) Provider: Dezyrae Kensinger FNP-C  Eustaquio Boyden, MD  Patient Care Team: Eustaquio Boyden, MD as PCP - General (Family Medicine) Marykay Lex, MD as Consulting Physician (Cardiology)  Extended Emergency Contact Information Primary Emergency Contact: Earl Lites Address: [address]          [place], [place] [address] [place] of [place] Phone: [phone] Relation: Daughter Secondary Emergency Contact: Leory Plowman [place] of [place] Mobile Phone: [phone] Relation: Relative  Code Status: DNR  Goals of care: Advanced Directive information Advanced Directives 12/12/2016  Does Patient Have a Medical Advance Directive? Yes  Type of Advance Directive Out of facility DNR (pink MOST or yellow form)  Does patient want to make changes to medical advance directive? No - Patient declined  Would patient like information on creating a medical advance directive? -  Pre-existing out of facility DNR order (yellow form or pink MOST form) Yellow form placed in chart (order not valid for inpatient use)     Chief Complaint  Patient presents with  . Acute Visit    right arm pain, abnormal lab results     HPI:  Pt is a 83 y.o. male seen today at [facility] for an acute visit for evaluation of evaluation of pain and abnormal lab results. He is seen in his room today. Facility therapist states patient is in a lot of pain.Current pain medication ordered for as needed but patient unable to request for pain meds due to language barrier. Facility Nurse reports patient oral intake is minimal. His recent lab recents showed Na 151, BUN 35, CR 1.40, WBC 14.6, Hgb 9.9, HCT 29.7 ( 12/12/2016). No fever or cough reported.    Past Medical History:  Diagnosis Date  . Acetabular fracture (HCC) 12/01/2016  . Acute ischemic left MCA stroke (HCC) 10/2015   "cognitive deficits mostly" (12/05/2016)  . Acute right MCA stroke (HCC) 09/23/2015  . Anemia   . Arthritis    "all over" (12/05/2016)  . C4 cervical fracture (HCC) 2015   with spinal cord contusion - stopped coumadin.  Marland Kitchen CAD in native artery -->  1994   referred for CABG  . Chronic atrial fibrillation (HCC) 12/15/2012  . Diverticulitis   . Dyslipidemia, goal LDL below 70[272.4]     - on statin, followed by primary physician.   . Essential hypertension 05/23/2013  . Glaucoma   . H/O seasonal allergies   . Hip fracture (HCC) 12/15/2015  . History of cardioembolic cerebrovascular accident (CVA) 10/28/2015  . History of kidney stones   . History of stomach ulcers   . Hypothyroidism   . Long term (current) use of anticoagulants 12/15/2012   warfarin  . Neck fracture (HCC) 2015   "no OR"  . S/P CABG x 4 1994   a) 1994: LIMA-LAD, SVG-RI, SVG-OM, SVG-RCA; b) 09/2006, Persantine Myoview: fixed inferior defect consistent with infarct/scar without peri-infarct ischemia;; 2-D echo: Mild-mod concentric LVH. Normal EF.  Abnormal relaxation, aortic sclerosis with mild-mod AI   Past Surgical History:  Procedure Laterality Date  . APPENDECTOMY    . CARDIAC CATHETERIZATION    . CATARACT EXTRACTION W/ INTRAOCULAR LENS  IMPLANT, BILATERAL Bilateral   . CORONARY ARTERY BYPASS GRAFT  1994   LIMA-LAD, SVG-RI, SVG-OM, SVG-RCA   . FEMUR SURGERY Left 2000   replacement- due to fall  . FINGER AMPUTATION Left 1985   "accident at  work; ring finger amputation"  . FRACTURE SURGERY    . HIP PINNING,CANNULATED Right 12/16/2015   Procedure: CANNULATED HIP PINNING;  Surgeon: Deeann Saint, MD;  Location: [facility] ORS;  Service: Orthopedics;  Laterality: Right;  . Persantine Myoview  February 2008   Fixed inferior wall defect-consistent with infarct/scar without peri-infarct ischemia  . TRANSTHORACIC ECHOCARDIOGRAM  February 2008   Mild/mod conc LVH, Nl Size & Fxn, Gr 1 DD(abnormal relaxation), mild to moderate aortic  regurgitation and mildly sclerotic aortic valve.    No Known Allergies  Allergies as of 12/12/2016   No Known Allergies     Medication List       Accurate as of 12/12/16  6:34 PM. Always use your most recent med list.          DECUBI-VITE PO Take 1 capsule by mouth daily.   dorzolamide 2 % ophthalmic solution Commonly known as:  TRUSOPT Place 1 drop into both eyes 2 (two) times daily.   feeding supplement (PRO-STAT SUGAR FREE 64) Liqd Take 30 mLs by mouth daily at 6 PM.   finasteride 5 MG tablet Commonly known as:  PROSCAR Take 1 tablet (5 mg total) by mouth daily.   latanoprost 0.005 % ophthalmic solution Commonly known as:  XALATAN Place 1 drop into both eyes at bedtime.   levothyroxine 88 MCG tablet Commonly known as:  SYNTHROID, LEVOTHROID TAKE 1 TABLET (88 MCG TOTAL) BY MOUTH DAILY BEFORE BREAKFAST.   LORazepam 1 MG tablet Commonly known as:  ATIVAN Take 1 mg by mouth at bedtime.   mirabegron ER 25 MG Tb24 tablet Commonly known as:  MYRBETRIQ Take 1 tablet (25 mg total) by mouth daily.   pantoprazole 40 MG tablet Commonly known as:  PROTONIX Take 1 tablet (40 mg total) by mouth daily.   polyethylene glycol packet Commonly known as:  MIRALAX / GLYCOLAX Take 17 g by mouth daily as needed for moderate constipation.   simvastatin 20 MG tablet Commonly known as:  ZOCOR TAKE 1 TABLET (20 MG TOTAL) BY MOUTH DAILY.   tamsulosin 0.4 MG Caps capsule Commonly known as:  FLOMAX Take 2 capsules (0.8 mg total) by mouth daily after supper.   traMADol 50 MG tablet Commonly known as:  ULTRAM Take 1 tablet (50 mg total) by mouth every 6 (six) hours as needed for moderate pain.   UNABLE TO FIND Med Name: Med pass 90 mL by mouth 3 times daily       Review of Systems  Unable to perform ROS: Other    Immunization History  Administered Date(s) Administered  . Influenza, High Dose Seasonal PF 06/23/2015  . Influenza,inj,Quad PF,36+ Mos 08/30/2016  .  Influenza-Unspecified 05/29/2014  . PPD Test 12/07/2016   Pertinent  Health Maintenance Due  Topic Date Due  . PNA vac Low Risk Adult (1 of 2 - PCV13) 09/14/1983  . INFLUENZA VACCINE  03/29/2017   Fall Risk  11/12/2015  Falls in the past year? No    Vitals:   12/12/16 1454  BP: 121/72  Pulse: 68  Resp: 18  Temp: 98.2 F (36.8 C)  TempSrc: Oral  SpO2: 94%  Weight: 153 lb 8 oz (69.6 kg)  Height: 5\' 6"  (1.676 m)   Body mass index is 24.78 kg/m. Physical Exam  Constitutional:  Thin frail elderly in no acute distress during visit   HENT:  Head: Normocephalic.  Mouth/Throat: No oropharyngeal exudate.  Dry mucosa membrane   Eyes: Conjunctivae and EOM are normal. Pupils are equal,  round, and reactive to light. Right eye exhibits no discharge. Left eye exhibits no discharge. No scleral icterus.  Neck: Neck supple. No JVD present. No tracheal deviation present.  Cardiovascular: Intact distal pulses.  Exam reveals no gallop and no friction rub.   No murmur heard.  irregular Heart rate.   Pulmonary/Chest: Effort normal. No respiratory distress. He has no wheezes. He has no rales. He exhibits no tenderness.  Diminished bases though unable to follow instructions to deep breath.   Abdominal: Soft. Bowel sounds are normal. He exhibits no distension and no mass. There is no tenderness. There is no rebound and no guarding.  Musculoskeletal:  Moves LUE and LLE without any difficulties. RUE cast in place. Fingers pink, warm to touch and moves without any difficulty. RLE limited ROM patient Guarding and moaning when RLE is touched.   Lymphadenopathy:    He has no cervical adenopathy.  Neurological: He is alert.  Skin: Skin is warm and dry. No rash noted. No erythema. No pallor.  Unable to assess for sacral pressure ulcer due to leg pain.   Psychiatric: He has a normal mood and affect.    Labs reviewed:  Recent Labs  12/03/16 0427 12/04/16 0311 12/05/16 0724  NA 139 138 141  K 4.1  4.4 4.3  CL 107 109 109  CO2 21* 22 21*  GLUCOSE 103* 102* 99  BUN 44* 39* 29*  CREATININE 1.71* 1.48* 1.30*  CALCIUM 8.9 8.3* 8.5*    Recent Labs  08/30/16 1733 12/01/16 2223  AST 20 29  ALT 13 22  ALKPHOS 94 88  BILITOT 0.3 0.6  PROT 7.8 7.4  ALBUMIN 3.9 3.4*    Recent Labs  12/15/15 1207  08/30/16 1733 12/01/16 2223  12/03/16 0427 12/04/16 0311 12/05/16 0724  WBC 18.5*  < > 8.1 13.9*  < > 11.5* 10.0 8.1  NEUTROABS 17.7*  --  6.5 13.0*  --   --   --   --   HGB 11.2*  < > 11.5* 10.7*  < > 8.1* 7.9* 8.1*  HCT 32.7*  < > 34.0* 31.5*  < > 24.3* 23.5* 24.0*  MCV 94.6  < > 93.4 94.3  < > 92.7 93.6 94.5  PLT 210  < > 238.0 229  < > 139* 127* 131*  < > = values in this interval not displayed. Lab Results  Component Value Date   TSH 0.63 08/30/2016   Lab Results  Component Value Date   HGBA1C 5.6 11/22/2015   Lab Results  Component Value Date   CHOL 122 09/23/2015   HDL 44 09/23/2015   LDLCALC 49 09/23/2015   LDLDIRECT 68.0 08/30/2016   TRIG 147 09/23/2015   CHOLHDL 2.8 09/23/2015   Significant Diagnostic Results in last 30 days:  Dg Chest 1 View  Result Date: 12/01/2016 CLINICAL DATA:  Ground level fall at home tonight, at 19:40. EXAM: CHEST 1 VIEW COMPARISON:  11/20/2015 FINDINGS: Stable moderate cardiomegaly. Prior sternotomy and CABG. The lungs are clear. The pulmonary vasculature is normal. There is no pneumothorax. No effusion. Hilar and mediastinal contours are unremarkable and unchanged. The bones appear osteopenic, but no displaced fractures are evident. Stable healed fracture deformity of the left fifth rib. IMPRESSION: No acute findings. Electronically Signed   By: Ellery Plunk M.D.   On: 12/01/2016 22:36   Dg Wrist Complete Right  Result Date: 12/02/2016 CLINICAL DATA:  Larey Seat at home tonight EXAM: RIGHT WRIST - COMPLETE 3+ VIEW COMPARISON:  None. FINDINGS:  There is a transverse fracture of the distal radius, intra-articular at the ulnar aspect of the  radiocarpal articulation. There is a transverse fracture of the ulnar styloid. No dislocation. IMPRESSION: Fractures of the distal radius and ulnar styloid. Electronically Signed   By: Ellery Plunk M.D.   On: 12/02/2016 00:56   Ct Head Wo Contrast  Result Date: 12/02/2016 CLINICAL DATA:  Larey Seat at home tonight. EXAM: CT HEAD WITHOUT CONTRAST CT CERVICAL SPINE WITHOUT CONTRAST TECHNIQUE: Multidetector CT imaging of the head and cervical spine was performed following the standard protocol without intravenous contrast. Multiplanar CT image reconstructions of the cervical spine were also generated. COMPARISON:  12/15/2015 FINDINGS: CT HEAD FINDINGS Brain: There is no intracranial hemorrhage, mass or evidence of acute infarction. There is prior left frontal infarction with encephalomalacia. There is moderate generalized atrophy. There is moderate chronic microvascular ischemic change. There is no significant extra-axial fluid collection. No acute intracranial findings are evident. Vascular: No hyperdense vessel or unexpected calcification. Skull: Normal. Negative for fracture or focal lesion. Sinuses/Orbits: No acute finding. Other: None. CT CERVICAL SPINE FINDINGS Alignment: Normal. Skull base and vertebrae: No acute fracture. No primary bone lesion or focal pathologic process. Soft tissues and spinal canal: No prevertebral fluid or swelling. No visible canal hematoma. Disc levels:  Moderate cervical degenerative disc and facet changes. Upper chest: Negative. Other: None IMPRESSION: 1. No acute intracranial findings. Prior left frontal infarction with encephalomalacia. There is moderate generalized atrophy and chronic appearing white matter hypodensities which likely represent small vessel ischemic disease. 2. Negative for acute cervical spine fracture. Electronically Signed   By: Ellery Plunk M.D.   On: 12/02/2016 00:37   Ct Chest W Contrast  Result Date: 12/02/2016 CLINICAL DATA:  Pain after fall EXAM:  CT CHEST, ABDOMEN, AND PELVIS WITH CONTRAST TECHNIQUE: Multidetector CT imaging of the chest, abdomen and pelvis was performed following the standard protocol during bolus administration of intravenous contrast. CONTRAST:  75mL ISOVUE-300 IOPAMIDOL (ISOVUE-300) INJECTION 61% COMPARISON:  Pelvic CT from 12/15/2015, chest radiograph from 11/20/2015 FINDINGS: CT CHEST FINDINGS Cardiovascular: Cardiomegaly without pericardial effusion. Three-vessel coronary arteriosclerosis is visualized. There is aortic atherosclerosis with ectasia of the ascending aorta measuring up to 3.8 cm. No dissection is noted. The patient is status post CABG. Atherosclerosis at the origins of the great vessels. Mediastinum/Nodes: No enlarged mediastinal, hilar, or axillary lymph nodes. Thyroid gland, trachea, and esophagus demonstrate no significant findings. Lungs/Pleura: There is dependent bibasilar atelectasis. No pulmonary consolidation or pneumothorax. No pleural effusion. Musculoskeletal: Rotator cuff calcific tendinopathy about the right shoulder. No acute fracture of the ribs. Chronic moderate compression of T4 without retropulsion. Mild inferior endplate compression of T7 also chronic in appearance. CT ABDOMEN PELVIS FINDINGS Hepatobiliary: Cholelithiasis without complication. Homogeneous enhancement of the liver without biliary dilatation. Pancreas: Atrophic pancreas without ductal dilatation or mass. Spleen: No splenomegaly or mass. Adrenals/Urinary Tract: Normal bilateral adrenal glands. Variable size cysts of both kidneys some are too small to further characterize. No obstructive uropathy. Cortical scarring of left kidney. Bladder stone is noted along the dependent wall measuring 15 mm. There is mural thickening of the bladder on the right, query inflammation or thickening due to mass effect from adjacent small pelvic side wall hemorrhage and edema. Ectasia of the left ureter without definite obstruction. Bilateral extrarenal  pelves are noted. Stomach/Bowel: Stomach is within normal limits. Appendix appears normal. No evidence of bowel wall thickening, distention, or inflammatory changes. Vascular/Lymphatic: Aortic and side branch atherosclerosis. No enlarged abdominal or pelvic lymph nodes.  Reproductive: Prostate is unremarkable. Other: No abdominal wall hernia or abnormality. No abdominopelvic ascites. Musculoskeletal: Coronal fracture through the acetabular roof, anteromedial wall of the acetabulum extending into the right iliac bone consistent with an anterior column fracture of the acetabulum. Associated small amount extraperitoneal hemorrhage and edema along the right obturator internus and retro pubic space of Retzius causing slight mass effect on the adjacent bladder. Chronic moderate L2 fracture. ORIF of both proximal femora. IMPRESSION: 1. New anterior column fracture of the right acetabulum with associated small amount of rightsided hemorrhage and edema in the extraperitoneal retropubic space of Retzius. 2. Cardiomegaly with 3 vessel coronary arteriosclerosis and ectasia of the ascending aorta. Aortic atherosclerosis. 3. Chronic T4 and T7 compression fractures. 4. Uncomplicated cholelithiasis. 5. Bilateral renal cysts and cortical scarring of the left kidney. 6. Nonobstructing bladder calculus. Eccentric right-sided bladder wall thickening possibly from underdistention and mass-effect from the extraperitoneal hemorrhage fluid. Asymmetric cystitis/mucosal inflammation is not excluded. Electronically Signed   By: Tollie Eth M.D.   On: 12/02/2016 01:25   Ct Cervical Spine Wo Contrast  Result Date: 12/02/2016 CLINICAL DATA:  Larey Seat at home tonight. EXAM: CT HEAD WITHOUT CONTRAST CT CERVICAL SPINE WITHOUT CONTRAST TECHNIQUE: Multidetector CT imaging of the head and cervical spine was performed following the standard protocol without intravenous contrast. Multiplanar CT image reconstructions of the cervical spine were also  generated. COMPARISON:  12/15/2015 FINDINGS: CT HEAD FINDINGS Brain: There is no intracranial hemorrhage, mass or evidence of acute infarction. There is prior left frontal infarction with encephalomalacia. There is moderate generalized atrophy. There is moderate chronic microvascular ischemic change. There is no significant extra-axial fluid collection. No acute intracranial findings are evident. Vascular: No hyperdense vessel or unexpected calcification. Skull: Normal. Negative for fracture or focal lesion. Sinuses/Orbits: No acute finding. Other: None. CT CERVICAL SPINE FINDINGS Alignment: Normal. Skull base and vertebrae: No acute fracture. No primary bone lesion or focal pathologic process. Soft tissues and spinal canal: No prevertebral fluid or swelling. No visible canal hematoma. Disc levels:  Moderate cervical degenerative disc and facet changes. Upper chest: Negative. Other: None IMPRESSION: 1. No acute intracranial findings. Prior left frontal infarction with encephalomalacia. There is moderate generalized atrophy and chronic appearing white matter hypodensities which likely represent small vessel ischemic disease. 2. Negative for acute cervical spine fracture. Electronically Signed   By: Ellery Plunk M.D.   On: 12/02/2016 00:37   Ct Abdomen Pelvis W Contrast  Result Date: 12/02/2016 CLINICAL DATA:  Pain after fall EXAM: CT CHEST, ABDOMEN, AND PELVIS WITH CONTRAST TECHNIQUE: Multidetector CT imaging of the chest, abdomen and pelvis was performed following the standard protocol during bolus administration of intravenous contrast. CONTRAST:  75mL ISOVUE-300 IOPAMIDOL (ISOVUE-300) INJECTION 61% COMPARISON:  Pelvic CT from 12/15/2015, chest radiograph from 11/20/2015 FINDINGS: CT CHEST FINDINGS Cardiovascular: Cardiomegaly without pericardial effusion. Three-vessel coronary arteriosclerosis is visualized. There is aortic atherosclerosis with ectasia of the ascending aorta measuring up to 3.8 cm. No  dissection is noted. The patient is status post CABG. Atherosclerosis at the origins of the great vessels. Mediastinum/Nodes: No enlarged mediastinal, hilar, or axillary lymph nodes. Thyroid gland, trachea, and esophagus demonstrate no significant findings. Lungs/Pleura: There is dependent bibasilar atelectasis. No pulmonary consolidation or pneumothorax. No pleural effusion. Musculoskeletal: Rotator cuff calcific tendinopathy about the right shoulder. No acute fracture of the ribs. Chronic moderate compression of T4 without retropulsion. Mild inferior endplate compression of T7 also chronic in appearance. CT ABDOMEN PELVIS FINDINGS Hepatobiliary: Cholelithiasis without complication. Homogeneous enhancement of the liver  without biliary dilatation. Pancreas: Atrophic pancreas without ductal dilatation or mass. Spleen: No splenomegaly or mass. Adrenals/Urinary Tract: Normal bilateral adrenal glands. Variable size cysts of both kidneys some are too small to further characterize. No obstructive uropathy. Cortical scarring of left kidney. Bladder stone is noted along the dependent wall measuring 15 mm. There is mural thickening of the bladder on the right, query inflammation or thickening due to mass effect from adjacent small pelvic side wall hemorrhage and edema. Ectasia of the left ureter without definite obstruction. Bilateral extrarenal pelves are noted. Stomach/Bowel: Stomach is within normal limits. Appendix appears normal. No evidence of bowel wall thickening, distention, or inflammatory changes. Vascular/Lymphatic: Aortic and side branch atherosclerosis. No enlarged abdominal or pelvic lymph nodes. Reproductive: Prostate is unremarkable. Other: No abdominal wall hernia or abnormality. No abdominopelvic ascites. Musculoskeletal: Coronal fracture through the acetabular roof, anteromedial wall of the acetabulum extending into the right iliac bone consistent with an anterior column fracture of the acetabulum.  Associated small amount extraperitoneal hemorrhage and edema along the right obturator internus and retro pubic space of Retzius causing slight mass effect on the adjacent bladder. Chronic moderate L2 fracture. ORIF of both proximal femora. IMPRESSION: 1. New anterior column fracture of the right acetabulum with associated small amount of rightsided hemorrhage and edema in the extraperitoneal retropubic space of Retzius. 2. Cardiomegaly with 3 vessel coronary arteriosclerosis and ectasia of the ascending aorta. Aortic atherosclerosis. 3. Chronic T4 and T7 compression fractures. 4. Uncomplicated cholelithiasis. 5. Bilateral renal cysts and cortical scarring of the left kidney. 6. Nonobstructing bladder calculus. Eccentric right-sided bladder wall thickening possibly from underdistention and mass-effect from the extraperitoneal hemorrhage fluid. Asymmetric cystitis/mucosal inflammation is not excluded. Electronically Signed   By: Tollie Eth M.D.   On: 12/02/2016 01:25   Dg Hip Unilat W Or Wo Pelvis 2-3 Views Right  Result Date: 12/01/2016 CLINICAL DATA:  Persistent pain after ground level fall at home tonight at 19:40 EXAM: DG HIP (WITH OR WITHOUT PELVIS) 2-3V RIGHT COMPARISON:  12/18/2015 FINDINGS: Four parallel fixation screws appear unchanged in the right hip. Unchanged intramedullary left femoral nail with interlocking dynamic hip screw. No evidence of hardware failure. No evidence of acute fracture. No dislocation. The bones are osteopenic, decreasing sensitivity for nondisplaced fractures. IMPRESSION: Negative for acute fracture or dislocation. Fixation hardware appears intact. Electronically Signed   By: Ellery Plunk M.D.   On: 12/01/2016 22:38    Assessment/Plan 1. Leukocytosis, unspecified type Afebrile. WBC 14.6 with left shift. Will Obtain stat portable CXR PA/Lat rule out pneumonia. Urine specimen for U/A and C/S rule out UTI. Monitor Temp Curve. Vital signs every shift x 5 days then resume  previous orders. Recheck CBC/diff 12/14/2016   2. Hypernatremia Possible from poor oral intake. Will start PIV D5W @ 75 cc/HR X 1 liter.Recheck CMP  12/13/2016 and 12/14/2016  3. Right arm pain Currently on Tramadol 50 mg tablet one by mouth every 6 hour PRN for pain. Will add Extra strength tylenol 1000 mg Tablet three times daily. PMR consult for pain management.   4. Right leg pain Patient guarding right leg during visit due to pain. Continue on tramadol 50 mg tablet one by mouth every 6 hours PRN. Start Extra strength tylenol 1000 mg Tablet three times daily. PMR consult for pain management.      Family/ staff Communication: Reviewed plan of care with patient and facility Nurse supervisor  Labs/tests ordered:CBC/diff, CMP 12/13/2016 and  12/14/2016    Caesar Bookman, NP

## 2016-12-14 DIAGNOSIS — G894 Chronic pain syndrome: Secondary | ICD-10-CM | POA: Diagnosis not present

## 2016-12-14 LAB — CBC AND DIFFERENTIAL
HEMATOCRIT: 27 % — AB (ref 41–53)
Hemoglobin: 9.1 g/dL — AB (ref 13.5–17.5)
Platelets: 515 10*3/uL — AB (ref 150–399)
WBC: 12.2 10^3/mL

## 2016-12-14 LAB — HEPATIC FUNCTION PANEL
ALT: 19 U/L (ref 10–40)
AST: 25 U/L (ref 14–40)
Alkaline Phosphatase: 114 U/L (ref 25–125)
BILIRUBIN, TOTAL: 1 mg/dL

## 2016-12-14 LAB — BASIC METABOLIC PANEL
BUN: 36 mg/dL — AB (ref 4–21)
CREATININE: 1.3 mg/dL (ref 0.6–1.3)
GLUCOSE: 113 mg/dL
Potassium: 3.9 mmol/L (ref 3.4–5.3)
SODIUM: 147 mmol/L (ref 137–147)

## 2016-12-17 ENCOUNTER — Encounter (HOSPITAL_COMMUNITY): Payer: Self-pay

## 2016-12-17 ENCOUNTER — Emergency Department (HOSPITAL_COMMUNITY): Payer: PPO

## 2016-12-17 ENCOUNTER — Emergency Department (HOSPITAL_COMMUNITY)
Admission: EM | Admit: 2016-12-17 | Discharge: 2016-12-17 | Disposition: A | Discharge status: Home / Self Care | Payer: PPO | Attending: Emergency Medicine | Admitting: Emergency Medicine

## 2016-12-17 DIAGNOSIS — R0902 Hypoxemia: Secondary | ICD-10-CM | POA: Diagnosis not present

## 2016-12-17 DIAGNOSIS — Y929 Unspecified place or not applicable: Secondary | ICD-10-CM | POA: Insufficient documentation

## 2016-12-17 DIAGNOSIS — Y999 Unspecified external cause status: Secondary | ICD-10-CM | POA: Insufficient documentation

## 2016-12-17 DIAGNOSIS — M25531 Pain in right wrist: Secondary | ICD-10-CM

## 2016-12-17 DIAGNOSIS — Z951 Presence of aortocoronary bypass graft: Secondary | ICD-10-CM | POA: Diagnosis not present

## 2016-12-17 DIAGNOSIS — I2510 Atherosclerotic heart disease of native coronary artery without angina pectoris: Secondary | ICD-10-CM | POA: Diagnosis not present

## 2016-12-17 DIAGNOSIS — S60911A Unspecified superficial injury of right wrist, initial encounter: Secondary | ICD-10-CM | POA: Diagnosis not present

## 2016-12-17 DIAGNOSIS — E039 Hypothyroidism, unspecified: Secondary | ICD-10-CM | POA: Diagnosis not present

## 2016-12-17 DIAGNOSIS — S5290XA Unspecified fracture of unspecified forearm, initial encounter for closed fracture: Secondary | ICD-10-CM | POA: Diagnosis not present

## 2016-12-17 DIAGNOSIS — Z8673 Personal history of transient ischemic attack (TIA), and cerebral infarction without residual deficits: Secondary | ICD-10-CM | POA: Diagnosis not present

## 2016-12-17 DIAGNOSIS — F419 Anxiety disorder, unspecified: Secondary | ICD-10-CM | POA: Diagnosis not present

## 2016-12-17 DIAGNOSIS — W1830XA Fall on same level, unspecified, initial encounter: Secondary | ICD-10-CM | POA: Diagnosis not present

## 2016-12-17 DIAGNOSIS — R4182 Altered mental status, unspecified: Secondary | ICD-10-CM | POA: Diagnosis not present

## 2016-12-17 DIAGNOSIS — Z79899 Other long term (current) drug therapy: Secondary | ICD-10-CM | POA: Insufficient documentation

## 2016-12-17 DIAGNOSIS — Y939 Activity, unspecified: Secondary | ICD-10-CM | POA: Insufficient documentation

## 2016-12-17 NOTE — ED Notes (Signed)
Pt had a formed BM. Diaper changed. No diarrhea or blood noted.

## 2016-12-17 NOTE — ED Notes (Signed)
Bed: WU98 Expected date:  Expected time:  Means of arrival:  Comments: 83 yo agitation-removed splint from broken arm

## 2016-12-17 NOTE — Discharge Instructions (Signed)
Pt is to have follow up with Dr.Mikhi Athey [facility]. Call Dr. [facility] Monday to schedule appointment to replace splint with a cast.

## 2016-12-17 NOTE — ED Triage Notes (Signed)
Pt BIB [facility] from [facility]. Pt fell 2 weeks ago and fractured his R wrist. Pt pulled off his wrist splint at the facility and need it replaced. At mental baseline per facility.

## 2016-12-17 NOTE — ED Notes (Signed)
Pt transported to XR.  

## 2016-12-17 NOTE — ED Notes (Signed)
[facility] called  

## 2016-12-17 NOTE — ED Provider Notes (Signed)
[facility]-EMERGENCY DEPT Provider Note   CSN: [id number] Arrival date & time: 12/17/16  1754     History   Chief Complaint Chief Complaint  Patient presents with  . Wrist Injury    R    HPI Jacob [name] is a 83 y.o. male. CC: Patent removed splint.  HPI: 83 year old male s/p right wrist fracture, and right acetabular fracture 4/6. Is back at [facility]. He removed his splint at the SNF. Sent here for evaluation.  Pt with recene bout of hypernatremia, s/p 3 days of IVF, and labs on 4/18 showing improvement.  Pt reported to be at baseline per SNF staff.   Past Medical History:  Diagnosis Date  . Acetabular fracture (HCC) 12/01/2016  . Acute ischemic left MCA stroke (HCC) 10/2015   "cognitive deficits mostly" (12/05/2016)  . Acute right MCA stroke (HCC) 09/23/2015  . Anemia   . Arthritis    "all over" (12/05/2016)  . C4 cervical fracture (HCC) 2015   with spinal cord contusion - stopped coumadin.  Marland Kitchen CAD in native artery -->  1994   referred for CABG  . Chronic atrial fibrillation (HCC) 12/15/2012  . Diverticulitis   . Dyslipidemia, goal LDL below 70[272.4]     - on statin, followed by primary physician.   . Essential hypertension 05/23/2013  . Glaucoma   . H/O seasonal allergies   . Hip fracture (HCC) 12/15/2015  . History of cardioembolic cerebrovascular accident (CVA) 10/28/2015  . History of kidney stones   . History of stomach ulcers   . Hypothyroidism   . Long term (current) use of anticoagulants 12/15/2012   warfarin  . Neck fracture (HCC) 2015   "no OR"  . S/P CABG x 4 1994   a) 1994: LIMA-LAD, SVG-RI, SVG-OM, SVG-RCA; b) 09/2006, Persantine Myoview: fixed inferior defect consistent with infarct/scar without peri-infarct ischemia;; 2-D echo: Mild-mod concentric LVH. Normal EF.  Abnormal relaxation, aortic sclerosis with mild-mod AI    Patient Active Problem List   Diagnosis Date Noted  . Pressure injury of skin 12/05/2016  . Acetabular fracture (HCC) 12/02/2016  . Distal  radius fracture, right 12/02/2016  . Fall   . Urge urinary incontinence 10/06/2016  . Visual hallucinations 08/30/2016  . History of cardioembolic cerebrovascular accident (CVA) 10/28/2015  . Anemia 09/24/2015  . Nocturia associated with benign prostatic hyperplasia 02/24/2015  . Hypothyroidism 08/02/2014  . Dysphagia 08/02/2014  . Essential hypertension 05/23/2013  . CAD in native artery -->  CABG x4 in 1994   . 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 Dyslipidemia, goal LDL below 70 - on statin, followed by primary physician.   . Chronic atrial fibrillation (HCC) 12/15/2012    Class: Chronic    Past Surgical History:  Procedure Laterality Date  . APPENDECTOMY    . CARDIAC CATHETERIZATION    . CATARACT EXTRACTION W/ INTRAOCULAR LENS  IMPLANT, BILATERAL Bilateral   . CORONARY ARTERY BYPASS GRAFT  1994   LIMA-LAD, SVG-RI, SVG-OM, SVG-RCA   . FEMUR SURGERY Left 2000   replacement- due to fall  . FINGER AMPUTATION Left 1985   "accident at work; ring finger amputation"  . FRACTURE SURGERY    . HIP PINNING,CANNULATED Right 12/16/2015   Procedure: CANNULATED HIP PINNING;  Surgeon: Deeann Saint, MD;  Location: [facility] ORS;  Service: Orthopedics;  Laterality: Right;  . Persantine Myoview  February 2008   Fixed inferior wall defect-consistent with infarct/scar without  peri-infarct ischemia  . TRANSTHORACIC ECHOCARDIOGRAM  February 2008   Mild/mod conc LVH, Nl Size & Fxn, Gr 1 DD(abnormal relaxation), mild to moderate aortic regurgitation and mildly sclerotic aortic valve.       Home Medications    Prior to Admission medications   Medication Sig Start Date End Date Taking? Authorizing Provider  acetaminophen (TYLENOL) 500 MG tablet Take 2 tablets (1,000 mg total) by mouth every 8 (eight) hours. 12/12/16  Yes Dinah C Ngetich, NP  Amino Acids-Protein Hydrolys (FEEDING SUPPLEMENT, PRO-STAT SUGAR FREE 64,) LIQD Take 30 mLs by mouth daily at 6 PM.    Yes Historical Provider, MD  dorzolamide (TRUSOPT) 2 % ophthalmic solution Place 1 drop into both eyes 2 (two) times daily.  06/22/16  Yes Historical Provider, MD  finasteride (PROSCAR) 5 MG tablet Take 1 tablet (5 mg total) by mouth daily. 11/24/16  Yes Eustaquio Boyden, MD  latanoprost (XALATAN) 0.005 % ophthalmic solution Place 1 drop into both eyes at bedtime.   Yes Historical Provider, MD  levothyroxine (SYNTHROID, LEVOTHROID) 88 MCG tablet TAKE 1 TABLET (88 MCG TOTAL) BY MOUTH DAILY BEFORE BREAKFAST. 09/21/16  Yes Eustaquio Boyden, MD  LORazepam (ATIVAN) 1 MG tablet Take 1 mg by mouth at bedtime.   Yes Historical Provider, MD  mirabegron ER (MYRBETRIQ) 25 MG TB24 tablet Take 1 tablet (25 mg total) by mouth daily. 10/06/16  Yes Eustaquio Boyden, MD  Multiple Vitamins-Minerals (DECUBI-VITE PO) Take 1 capsule by mouth daily.   Yes Historical Provider, MD  pantoprazole (PROTONIX) 40 MG tablet Take 1 tablet (40 mg total) by mouth daily. 12/07/16  Yes Edson Snowball, PA-C  polyethylene glycol (MIRALAX / GLYCOLAX) packet Take 17 g by mouth daily as needed for moderate constipation. 12/07/16  Yes Edson Snowball, PA-C  simvastatin (ZOCOR) 20 MG tablet TAKE 1 TABLET (20 MG TOTAL) BY MOUTH DAILY. 06/13/16  Yes Drema Dallas, DO  tamsulosin (FLOMAX) 0.4 MG CAPS capsule Take 2 capsules (0.8 mg total) by mouth daily after supper. 11/24/16  Yes Eustaquio Boyden, MD  traMADol (ULTRAM) 50 MG tablet Take 1 tablet (50 mg total) by mouth every 6 (six) hours as needed for moderate pain. Patient taking differently: Take 25 mg by mouth every 8 (eight) hours.  12/07/16  Yes Edson Snowball, PA-C  UNABLE TO FIND Med Name: Med pass 90 mL by mouth 3 times daily   Yes Historical Provider, MD    Family History Family History  Problem Relation Age of Onset  . Liver cancer Mother   . Prostate cancer Father     Social History Social History  Substance Use Topics  . Smoking status: Never Smoker  . Smokeless tobacco: Never  Used  . Alcohol use 0.0 oz/week     Comment: 12/05/2016 "might have a drink at Christmas"     Allergies   Patient has no known allergies.   Review of Systems Review of Systems Level V Caveat for dementia.  Physical Exam Updated Vital Signs BP 130/76 (BP Location: Left Arm)   Pulse 93   Temp 97.7 F (36.5 C) (Oral)   Resp 16   SpO2 100%   Physical Exam  Constitutional: He appears well-developed. No distress.  Thin/Frail adult male.  HENT:  Head: Normocephalic.  Eyes: Conjunctivae are normal. Pupils are equal, round, and reactive to light. No scleral icterus.  Neck: Normal range of motion. Neck supple. No thyromegaly present.  Cardiovascular: Normal rate and regular rhythm.  Exam reveals no gallop and  no friction rub.   No murmur heard. Pulmonary/Chest: Effort normal and breath sounds normal. No respiratory distress. He has no wheezes. He has no rales.  Abdominal: Soft. Bowel sounds are normal. He exhibits no distension. There is no tenderness. There is no rebound.  Musculoskeletal: Normal range of motion.  Tenderness and STS to right distal radius.  Neurological: He is alert.  Skin: Skin is warm and dry. No rash noted.  Psychiatric: He has a normal mood and affect. His behavior is normal.     ED Treatments / Results  Labs (all labs ordered are listed, but only abnormal results are displayed) Labs Reviewed - No data to display  EKG  EKG Interpretation None       Radiology Dg Wrist Complete Right  Result Date: 12/17/2016 CLINICAL DATA:  Right wrist pain following a fall today hat a nursing home. Known right wrist fracture. EXAM: RIGHT WRIST - COMPLETE 3+ VIEW COMPARISON:  12/02/2016. FINDINGS: Again demonstrated is a transverse fracture of the distal radial metaphysis with interval 1/3 shaft width of radial displacement of the distal fragment and 1/3 shaft width of dorsal displacement of the distal fragment. There is also dorsal angulation of the distal fragment.  An ulnar styloid fracture is also again demonstrated with interval radial displacement of the distal fragment. IMPRESSION: Interval displacement of the previously demonstrated distal radius and ulnar styloid fractures. Electronically Signed   By: Beckie Salts M.D.   On: 12/17/2016 18:25    Procedures Procedures (including critical care time)  Medications Ordered in ED Medications - No data to display   Initial Impression / Assessment and Plan / ED Course  I have reviewed the triage vital signs and the nursing notes.  Pertinent labs & imaging results that were available during my care of the patient were reviewed by me and considered in my medical decision making (see chart for details).     Will repeat XR to check fracture alignment and plan replacement of splint.  Trace do show slight displacement of the fracture. Clinically his wrist is anatomic and is well-perfused. Split was applied. At my request circumferential fiberglass was placed to prevent removal of the splint. I reexamined him after he was placed. This may also require reduction and casting. With consideration of his age and DO NOT RESUSCITATE status leave this at the discretion of the patient, staff, family, and his orthopedist. He has not been seen by orthopedics. Discharge instructions for the care facility called to arrange his follow-up appointment.  Final Clinical Impressions(s) / ED Diagnoses   Final diagnoses:  Right wrist pain    New Prescriptions New Prescriptions   No medications on file     Rolland Porter, MD 12/17/16 1856

## 2016-12-17 NOTE — ED Notes (Signed)
Pt unable to sign at d/c. Left with PTAR.

## 2016-12-19 ENCOUNTER — Other Ambulatory Visit: Payer: Self-pay | Admitting: Neurology

## 2016-12-19 DIAGNOSIS — M79609 Pain in unspecified limb: Secondary | ICD-10-CM | POA: Diagnosis not present

## 2016-12-20 ENCOUNTER — Encounter (INDEPENDENT_AMBULATORY_CARE_PROVIDER_SITE_OTHER): Payer: Self-pay | Admitting: Orthopaedic Surgery

## 2016-12-20 ENCOUNTER — Ambulatory Visit (INDEPENDENT_AMBULATORY_CARE_PROVIDER_SITE_OTHER): Payer: PPO | Admitting: Orthopaedic Surgery

## 2016-12-20 DIAGNOSIS — S52531G Colles' fracture of right radius, subsequent encounter for closed fracture with delayed healing: Secondary | ICD-10-CM

## 2016-12-20 DIAGNOSIS — S32401S Unspecified fracture of right acetabulum, sequela: Secondary | ICD-10-CM

## 2016-12-20 NOTE — Progress Notes (Signed)
Post-Op Visit Note   Patient: Jacob [name]           Date of Birth: Aug 17, 1917           MRN: [id number] Visit Date: 12/20/2016 PCP: Eustaquio Boyden, MD   Assessment & Plan: Return visit 4 weeks. AP pelvis x-ray to assess acetabular fracture healing at that time as well as hopeful final x-rays of the distal radius. His daughter is with him today and she understands that even if it heals and occur good position it still would be acceptable for him to his severe dementia.  Chief Complaint:  Chief Complaint  Patient presents with  . Right Hip - Fracture  . Right Wrist - Fracture   Visit Diagnoses:  1. Closed displaced fracture of right acetabulum, unspecified portion of acetabulum, sequela   2. Closed Colles' fracture of right radius with delayed healing, subsequent encounter     Plan: She will return to the emergency room were then applied a splint. Originally had a cast her sugar tong which he removed and is placed in a Velcro splint he took this off. Due to his dementia he continues to pick it whenever he has on his arm and remove it. I wrote a note that the current the sugar tong splint comes off they can just place him back in the Velcro splint that he was in before and apply duct tape. I will recheck in 4 weeks for repeat x-rays of his wrist. He remains nonweightbearing on his acetabular fracture.  Follow-Up Instructions: Return in about 4 weeks (around 01/17/2017).   Orders:  No orders of the defined types were placed in this encounter.  No orders of the defined types were placed in this encounter.   Imaging: No results found.  PMFS History: Patient Active Problem List   Diagnosis Date Noted  . Pressure injury of skin 12/05/2016  . Acetabular fracture (HCC) 12/02/2016  . Distal radius fracture, right 12/02/2016  . Fall   . Urge urinary incontinence 10/06/2016  . Visual hallucinations 08/30/2016  . History of cardioembolic cerebrovascular accident (CVA) 10/28/2015    . Anemia 09/24/2015  . Nocturia associated with benign prostatic hyperplasia 02/24/2015  . Hypothyroidism 08/02/2014  . Dysphagia 08/02/2014  . Essential hypertension 05/23/2013  . CAD in native artery -->  CABG x4 in 1994   . 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 Dyslipidemia, goal LDL below 70 - on statin, followed by primary physician.   . Chronic atrial fibrillation (HCC) 12/15/2012    Class: Chronic   Past Medical History:  Diagnosis Date  . Acetabular fracture (HCC) 12/01/2016  . Acute ischemic left MCA stroke (HCC) 10/2015   "cognitive deficits mostly" (12/05/2016)  . Acute right MCA stroke (HCC) 09/23/2015  . Anemia   . Arthritis    "all over" (12/05/2016)  . C4 cervical fracture (HCC) 2015   with spinal cord contusion - stopped coumadin.  Marland Kitchen CAD in native artery -->  1994   referred for CABG  . Chronic atrial fibrillation (HCC) 12/15/2012  . Diverticulitis   . Dyslipidemia, goal LDL below 70[272.4]     - on statin, followed by primary physician.   . Essential hypertension 05/23/2013  . Glaucoma   . H/O seasonal allergies   . Hip fracture (HCC) 12/15/2015  . History of cardioembolic cerebrovascular accident (CVA) 10/28/2015  . History of kidney stones   . History of stomach  ulcers   . Hypothyroidism   . Long term (current) use of anticoagulants 12/15/2012   warfarin  . Neck fracture (HCC) 2015   "no OR"  . S/P CABG x 4 1994   a) 1994: LIMA-LAD, SVG-RI, SVG-OM, SVG-RCA; b) 09/2006, Persantine Myoview: fixed inferior defect consistent with infarct/scar without peri-infarct ischemia;; 2-D echo: Mild-mod concentric LVH. Normal EF.  Abnormal relaxation, aortic sclerosis with mild-mod AI    Family History  Problem Relation Age of Onset  . Liver cancer Mother   . Prostate cancer Father     Past Surgical History:  Procedure Laterality Date  . APPENDECTOMY    . CARDIAC CATHETERIZATION    . CATARACT EXTRACTION W/ INTRAOCULAR LENS   IMPLANT, BILATERAL Bilateral   . CORONARY ARTERY BYPASS GRAFT  1994   LIMA-LAD, SVG-RI, SVG-OM, SVG-RCA   . FEMUR SURGERY Left 2000   replacement- due to fall  . FINGER AMPUTATION Left 1985   "accident at work; ring finger amputation"  . FRACTURE SURGERY    . HIP PINNING,CANNULATED Right 12/16/2015   Procedure: CANNULATED HIP PINNING;  Surgeon: Deeann Saint, MD;  Location: [facility] ORS;  Service: Orthopedics;  Laterality: Right;  . Persantine Myoview  February 2008   Fixed inferior wall defect-consistent with infarct/scar without peri-infarct ischemia  . TRANSTHORACIC ECHOCARDIOGRAM  February 2008   Mild/mod conc LVH, Nl Size & Fxn, Gr 1 DD(abnormal relaxation), mild to moderate aortic regurgitation and mildly sclerotic aortic valve.   Social History   Occupational History  . Not on file.   Social History Main Topics  . Smoking status: Never Smoker  . Smokeless tobacco: Never Used  . Alcohol use 0.0 oz/week     Comment: 12/05/2016 "might have a drink at Christmas"  . Drug use: No  . Sexual activity: Not on file

## 2016-12-21 DIAGNOSIS — M79609 Pain in unspecified limb: Secondary | ICD-10-CM | POA: Diagnosis not present

## 2016-12-27 DIAGNOSIS — M6281 Muscle weakness (generalized): Secondary | ICD-10-CM | POA: Diagnosis not present

## 2016-12-27 DIAGNOSIS — S32401S Unspecified fracture of right acetabulum, sequela: Secondary | ICD-10-CM | POA: Diagnosis not present

## 2016-12-27 DIAGNOSIS — S52501S Unspecified fracture of the lower end of right radius, sequela: Secondary | ICD-10-CM | POA: Diagnosis not present

## 2016-12-27 DIAGNOSIS — R41841 Cognitive communication deficit: Secondary | ICD-10-CM | POA: Diagnosis not present

## 2016-12-27 DIAGNOSIS — Z5189 Encounter for other specified aftercare: Secondary | ICD-10-CM | POA: Diagnosis not present

## 2016-12-27 DIAGNOSIS — R1312 Dysphagia, oropharyngeal phase: Secondary | ICD-10-CM | POA: Diagnosis not present

## 2016-12-28 DIAGNOSIS — M79609 Pain in unspecified limb: Secondary | ICD-10-CM | POA: Diagnosis not present

## 2017-01-05 ENCOUNTER — Non-Acute Institutional Stay (SKILLED_NURSING_FACILITY): Payer: PPO | Admitting: Family

## 2017-01-05 ENCOUNTER — Ambulatory Visit: Payer: PPO | Admitting: Family Medicine

## 2017-01-05 ENCOUNTER — Encounter: Payer: Self-pay | Admitting: Family

## 2017-01-05 DIAGNOSIS — M79601 Pain in right arm: Secondary | ICD-10-CM | POA: Diagnosis not present

## 2017-01-05 DIAGNOSIS — E039 Hypothyroidism, unspecified: Secondary | ICD-10-CM

## 2017-01-05 DIAGNOSIS — M79604 Pain in right leg: Secondary | ICD-10-CM

## 2017-01-05 DIAGNOSIS — I10 Essential (primary) hypertension: Secondary | ICD-10-CM

## 2017-01-05 NOTE — Progress Notes (Signed)
Location:  [facility] Room Number: 509A Place of Service:  SNF [id number]) Provider: Yasmeen Manka FNP-C  Eustaquio Boyden, MD  Patient Care Team: Eustaquio Boyden, MD as PCP - General (Family Medicine) Marykay Lex, MD as Consulting Physician (Cardiology)  Extended Emergency Contact Information Primary Emergency Contact: Earl Lites Address: [address]          [place], [place] [address] [place] Phone: [phone] Relation: Daughter Secondary Emergency Contact: Leory Plowman [place] of [place] Mobile Phone: [phone] Relation: Relative  Code Status: DNR  Goals of care: Advanced Directive information Advanced Directives 01/05/2017  Does Patient Have a Medical Advance Directive? Yes  Type of Advance Directive Out of facility DNR (pink MOST or yellow form)  Does patient want to make changes to medical advance directive? -  Would patient like information on creating a medical advance directive? -  Pre-existing out of facility DNR order (yellow form or pink MOST form) Yellow form placed in chart (order not valid for inpatient use)     Chief Complaint  Patient presents with  . Medical Management of Chronic Issues    Routine visit    HPI:  Pt is a 83 y.o. male seen today at [facility] for medical management of chronic issues.He has a medical history of HTN, CAD,CVA, Afib,Hypothyroidism,hyperlipidemia,OA among other condition.He is seen in his room today.He denies any acute issues this visit.History limited due to cognitive impairment.  He has had no recent weight changes or fall episodes. Facility Nurse reports no new concerns. He continues to work with therapy.    Past Medical History:  Diagnosis Date  . Acetabular fracture (HCC) 12/01/2016  . Acute ischemic left MCA stroke (HCC) 10/2015   "cognitive deficits mostly" (12/05/2016)  . Acute right MCA stroke (HCC) 09/23/2015  . Anemia   .  Arthritis    "all over" (12/05/2016)  . C4 cervical fracture (HCC) 2015   with spinal cord contusion - stopped coumadin.  Marland Kitchen CAD in native artery -->  1994   referred for CABG  . Chronic atrial fibrillation (HCC) 12/15/2012  . Diverticulitis   . Dyslipidemia, goal LDL below 70[272.4]     - on statin, followed by primary physician.   . Essential hypertension 05/23/2013  . Glaucoma   . H/O seasonal allergies   . Hip fracture (HCC) 12/15/2015  . History of cardioembolic cerebrovascular accident (CVA) 10/28/2015  . History of kidney stones   . History of stomach ulcers   . Hypothyroidism   . Long term (current) use of anticoagulants 12/15/2012   warfarin  . Neck fracture (HCC) 2015   "no OR"  . S/P CABG x 4 1994   a) 1994: LIMA-LAD, SVG-RI, SVG-OM, SVG-RCA; b) 09/2006, Persantine Myoview: fixed inferior defect consistent with infarct/scar without peri-infarct ischemia;; 2-D echo: Mild-mod concentric LVH. Normal EF.  Abnormal relaxation, aortic sclerosis with mild-mod AI   Past Surgical History:  Procedure Laterality Date  . APPENDECTOMY    . CARDIAC CATHETERIZATION    . CATARACT EXTRACTION W/ INTRAOCULAR LENS  IMPLANT, BILATERAL Bilateral   . CORONARY ARTERY BYPASS GRAFT  1994   LIMA-LAD, SVG-RI, SVG-OM, SVG-RCA   . FEMUR SURGERY Left 2000   replacement- due to fall  . FINGER AMPUTATION Left 1985   "accident at work; ring finger amputation"  . FRACTURE SURGERY    . HIP PINNING,CANNULATED Right 12/16/2015   Procedure: CANNULATED HIP PINNING;  Surgeon: Deeann Saint, MD;  Location: [facility] ORS;  Service: Orthopedics;  Laterality: Right;  . Persantine Myoview  February 2008   Fixed inferior wall defect-consistent with infarct/scar without peri-infarct ischemia  . TRANSTHORACIC ECHOCARDIOGRAM  February 2008   Mild/mod conc LVH, Nl Size & Fxn, Gr 1 DD(abnormal relaxation), mild to moderate aortic regurgitation and mildly sclerotic aortic valve.    No Known Allergies  Allergies as of  01/05/2017   No Known Allergies     Medication List       Accurate as of 01/05/17 12:12 PM. Always use your most recent med list.          acetaminophen 500 MG tablet Commonly known as:  TYLENOL Take 2 tablets (1,000 mg total) by mouth every 8 (eight) hours.   dorzolamide 2 % ophthalmic solution Commonly known as:  TRUSOPT Place 1 drop into both eyes 2 (two) times daily.   feeding supplement (PRO-STAT SUGAR FREE 64) Liqd Take 30 mLs by mouth daily at 6 PM.   finasteride 5 MG tablet Commonly known as:  PROSCAR Take 1 tablet (5 mg total) by mouth daily.   latanoprost 0.005 % ophthalmic solution Commonly known as:  XALATAN Place 1 drop into both eyes at bedtime.   levothyroxine 88 MCG tablet Commonly known as:  SYNTHROID, LEVOTHROID TAKE 1 TABLET (88 MCG TOTAL) BY MOUTH DAILY BEFORE BREAKFAST.   LORazepam 1 MG tablet Commonly known as:  ATIVAN Take 1 mg by mouth at bedtime.   mirabegron ER 25 MG Tb24 tablet Commonly known as:  MYRBETRIQ Take 1 tablet (25 mg total) by mouth daily.   multivitamin tablet Take 1 tablet by mouth daily.   pantoprazole 40 MG tablet Commonly known as:  PROTONIX Take 1 tablet (40 mg total) by mouth daily.   polyethylene glycol packet Commonly known as:  MIRALAX / GLYCOLAX Take 17 g by mouth daily as needed for moderate constipation.   simvastatin 20 MG tablet Commonly known as:  ZOCOR TAKE 1 TABLET (20 MG TOTAL) BY MOUTH DAILY.   tamsulosin 0.4 MG Caps capsule Commonly known as:  FLOMAX Take 2 capsules (0.8 mg total) by mouth daily after supper.   traMADol 50 MG tablet Commonly known as:  ULTRAM Take 25 mg by mouth 4 (four) times daily. Hold if sleepy or confused   UNABLE TO FIND Med Name: Med pass 90 mL by mouth 3 times daily       Review of Systems  Unable to perform ROS: Other    Immunization History  Administered Date(s) Administered  . Influenza, High Dose Seasonal PF 06/23/2015  . Influenza,inj,Quad PF,36+ Mos  08/30/2016  . Influenza-Unspecified 05/29/2014  . PPD Test 12/07/2016, 12/21/2016   Pertinent  Health Maintenance Due  Topic Date Due  . PNA vac Low Risk Adult (1 of 2 - PCV13) 01/05/2017 (Originally 09/14/1983)  . INFLUENZA VACCINE  03/29/2017   Fall Risk  11/12/2015  Falls in the past year? No    Vitals:   01/05/17 1010  BP: (!) 145/78  Pulse: 87  Resp: 20  Temp: 98 F (36.7 C)  SpO2: 97%  Weight: 143 lb (64.9 kg)  Height: 5\' 6"  (1.676 m)   Body mass index is 23.08 kg/m. Physical Exam  Constitutional: No distress.   elderly  HENT:  Head: Normocephalic.  Mouth/Throat: No oropharyngeal exudate.  Eyes: Conjunctivae and EOM are normal. Pupils are equal, round, and reactive to light. Right eye exhibits no discharge. Left eye exhibits no discharge. No scleral icterus.  Neck: Neck  supple. No JVD present. No tracheal deviation present.  Cardiovascular: Intact distal pulses.  Exam reveals no gallop and no friction rub.   No murmur heard.  irregular Heart rate.   Pulmonary/Chest: Effort normal and breath sounds normal. No respiratory distress. He has no wheezes. He has no rales. He exhibits no tenderness.  Abdominal: Soft. Bowel sounds are normal. He exhibits no distension and no mass. There is no tenderness. There is no rebound and no guarding.  Musculoskeletal:  RUE cast in place.Trace edema to bilateral lower extremities.   Lymphadenopathy:    He has no cervical adenopathy.  Neurological: He is alert.  Pleasantly confused at his baseline  Skin: Skin is warm and dry. No rash noted. No erythema. No pallor.  Psychiatric: He has a normal mood and affect.    Labs reviewed:  Recent Labs  12/03/16 0427 12/04/16 0311 12/05/16 0724 12/14/16  NA 139 138 141 147  K 4.1 4.4 4.3 3.9  CL 107 109 109  --   CO2 21* 22 21*  --   GLUCOSE 103* 102* 99  --   BUN 44* 39* 29* 36*  CREATININE 1.71* 1.48* 1.30* 1.3  CALCIUM 8.9 8.3* 8.5*  --     Recent Labs  08/30/16 1733  12/01/16 2223 12/14/16  AST 20 29 25   ALT 13 22 19   ALKPHOS 94 88 114  BILITOT 0.3 0.6  --   PROT 7.8 7.4  --   ALBUMIN 3.9 3.4*  --     Recent Labs  08/30/16 1733 12/01/16 2223  12/03/16 0427 12/04/16 0311 12/05/16 0724 12/14/16  WBC 8.1 13.9*  < > 11.5* 10.0 8.1 12.2  NEUTROABS 6.5 13.0*  --   --   --   --   --   HGB 11.5* 10.7*  < > 8.1* 7.9* 8.1* 9.1*  HCT 34.0* 31.5*  < > 24.3* 23.5* 24.0* 27*  MCV 93.4 94.3  < > 92.7 93.6 94.5  --   PLT 238.0 229  < > 139* 127* 131* 515*  < > = values in this interval not displayed. Lab Results  Component Value Date   TSH 0.63 08/30/2016   Lab Results  Component Value Date   HGBA1C 5.6 11/22/2015   Lab Results  Component Value Date   CHOL 122 09/23/2015   HDL 44 09/23/2015   LDLCALC 49 09/23/2015   LDLDIRECT 68.0 08/30/2016   TRIG 147 09/23/2015   CHOLHDL 2.8 09/23/2015   Assessment/Plan 1.  Right arm pain Cast in place. Pain under control on Tramadol 50 mg tablet one by mouth every 6 hour PRN and Extra strength tylenol 1000 mg Tablet three times daily. Continue to follow up with PMR specialist.continue to follow up with Ortho.   2. Right leg pain Has improved on tramadol 50 mg tablet one by mouth every 6 hours PRN and Extra strength tylenol 1000 mg Tablet three times daily.Continue to work with Therapy. Continue to follow up with Ortho and  PMR specialist.  3. HTN B/p stable.Continue to monitor.monitor BMP   4. Hypothyroidism    Lab Results  Component Value Date   TSH 0.63 08/30/2016  Continue on Levothyroxine 88 mcg Tablet. Recheck TSH next visit.   Family/ staff Communication: Reviewed plan of care with patient and facility Nurse.   Labs/tests ordered:None    Caesar Bookman, NP

## 2017-01-06 DIAGNOSIS — N390 Urinary tract infection, site not specified: Secondary | ICD-10-CM | POA: Diagnosis not present

## 2017-01-18 ENCOUNTER — Ambulatory Visit (INDEPENDENT_AMBULATORY_CARE_PROVIDER_SITE_OTHER): Payer: PPO

## 2017-01-18 ENCOUNTER — Ambulatory Visit (INDEPENDENT_AMBULATORY_CARE_PROVIDER_SITE_OTHER): Payer: PPO | Admitting: Orthopaedic Surgery

## 2017-01-18 ENCOUNTER — Encounter (INDEPENDENT_AMBULATORY_CARE_PROVIDER_SITE_OTHER): Payer: Self-pay | Admitting: Orthopaedic Surgery

## 2017-01-18 DIAGNOSIS — S52531G Colles' fracture of right radius, subsequent encounter for closed fracture with delayed healing: Secondary | ICD-10-CM

## 2017-01-18 DIAGNOSIS — M25511 Pain in right shoulder: Secondary | ICD-10-CM | POA: Diagnosis not present

## 2017-01-18 DIAGNOSIS — S32401S Unspecified fracture of right acetabulum, sequela: Secondary | ICD-10-CM

## 2017-01-18 NOTE — Progress Notes (Signed)
Post-Op Visit Note   Patient: Jacob [name]           Date of Birth: 1917-02-01           MRN: [id number] Visit Date: 01/18/2017 PCP: Eustaquio Boyden, MD   Assessment & Plan:  Chief Complaint:  Chief Complaint  Patient presents with  . Right Shoulder - Pain  . Right Wrist - Fracture, Follow-up  . Pelvis - Fracture, Follow-up   Visit Diagnoses:  1. Closed Colles' fracture of right radius with delayed healing, subsequent encounter   2. Closed displaced fracture of right acetabulum, unspecified portion of acetabulum, sequela   3. Acute pain of right shoulder     Plan: Patient has the some skin ulceration over the ulnar styloid where the splint is been rubbing. He had removed previous plants as well as cast in the past. His fractures healed he can wash it with soap and water at the skilled facility. Okay for weightbearing as tolerated with his lower extremities. Pelvic fractures heal.  Follow-Up Instructions: No Follow-up on file.   Orders:  Orders Placed This Encounter  Procedures  . XR Pelvis 1-2 Views  . XR Wrist 2 Views Right  . XR Shoulder Right   No orders of the defined types were placed in this encounter.   Imaging: No results found.  PMFS History: Patient Active Problem List   Diagnosis Date Noted  . Pressure injury of skin 12/05/2016  . Acetabular fracture (HCC) 12/02/2016  . Distal radius fracture, right 12/02/2016  . Fall   . Urge urinary incontinence 10/06/2016  . Visual hallucinations 08/30/2016  . History of cardioembolic cerebrovascular accident (CVA) 10/28/2015  . Anemia 09/24/2015  . Nocturia associated with benign prostatic hyperplasia 02/24/2015  . Hypothyroidism 08/02/2014  . Dysphagia 08/02/2014  . Essential hypertension 05/23/2013  . CAD in native artery -->  CABG x4 in 1994   . 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 Dyslipidemia, goal LDL below 70 - on statin, followed by primary physician.    . Chronic atrial fibrillation (HCC) 12/15/2012    Class: Chronic   Past Medical History:  Diagnosis Date  . Acetabular fracture (HCC) 12/01/2016  . Acute ischemic left MCA stroke (HCC) 10/2015   "cognitive deficits mostly" (12/05/2016)  . Acute right MCA stroke (HCC) 09/23/2015  . Anemia   . Arthritis    "all over" (12/05/2016)  . C4 cervical fracture (HCC) 2015   with spinal cord contusion - stopped coumadin.  Marland Kitchen CAD in native artery -->  1994   referred for CABG  . Chronic atrial fibrillation (HCC) 12/15/2012  . Diverticulitis   . Dyslipidemia, goal LDL below 70[272.4]     - on statin, followed by primary physician.   . Essential hypertension 05/23/2013  . Glaucoma   . H/O seasonal allergies   . Hip fracture (HCC) 12/15/2015  . History of cardioembolic cerebrovascular accident (CVA) 10/28/2015  . History of kidney stones   . History of stomach ulcers   . Hypothyroidism   . Long term (current) use of anticoagulants 12/15/2012   warfarin  . Neck fracture (HCC) 2015   "no OR"  . S/P CABG x 4 1994   a) 1994: LIMA-LAD, SVG-RI, SVG-OM, SVG-RCA; b) 09/2006, Persantine Myoview: fixed inferior defect consistent with infarct/scar without peri-infarct ischemia;; 2-D echo: Mild-mod concentric LVH. Normal EF.  Abnormal relaxation, aortic sclerosis with mild-mod AI    Family History  Problem Relation Age of Onset  . Liver cancer Mother   . Prostate cancer Father     Past Surgical History:  Procedure Laterality Date  . APPENDECTOMY    . CARDIAC CATHETERIZATION    . CATARACT EXTRACTION W/ INTRAOCULAR LENS  IMPLANT, BILATERAL Bilateral   . CORONARY ARTERY BYPASS GRAFT  1994   LIMA-LAD, SVG-RI, SVG-OM, SVG-RCA   . FEMUR SURGERY Left 2000   replacement- due to fall  . FINGER AMPUTATION Left 1985   "accident at work; ring finger amputation"  . FRACTURE SURGERY    . HIP PINNING,CANNULATED Right 12/16/2015   Procedure: CANNULATED HIP PINNING;  Surgeon: Deeann SaintHoward Miller, MD;  Location: [facility] ORS;   Service: Orthopedics;  Laterality: Right;  . Persantine Myoview  February 2008   Fixed inferior wall defect-consistent with infarct/scar without peri-infarct ischemia  . TRANSTHORACIC ECHOCARDIOGRAM  February 2008   Mild/mod conc LVH, Nl Size & Fxn, Gr 1 DD(abnormal relaxation), mild to moderate aortic regurgitation and mildly sclerotic aortic valve.   Social History   Occupational History  . Not on file.   Social History Main Topics  . Smoking status: Never Smoker  . Smokeless tobacco: Never Used  . Alcohol use 0.0 oz/week     Comment: 12/05/2016 "might have a drink at Christmas"  . Drug use: No  . Sexual activity: Not on file

## 2017-01-20 DIAGNOSIS — R1312 Dysphagia, oropharyngeal phase: Secondary | ICD-10-CM | POA: Diagnosis not present

## 2017-01-20 DIAGNOSIS — R41841 Cognitive communication deficit: Secondary | ICD-10-CM | POA: Diagnosis not present

## 2017-01-27 DIAGNOSIS — R41841 Cognitive communication deficit: Secondary | ICD-10-CM | POA: Diagnosis not present

## 2017-01-27 DIAGNOSIS — R1312 Dysphagia, oropharyngeal phase: Secondary | ICD-10-CM | POA: Diagnosis not present

## 2017-02-09 ENCOUNTER — Telehealth (INDEPENDENT_AMBULATORY_CARE_PROVIDER_SITE_OTHER): Payer: Self-pay | Admitting: Radiology

## 2017-02-09 NOTE — Telephone Encounter (Signed)
I called Jacob [name], got voicemail message. She is not taking messages again at this time. I will try back later.

## 2017-02-09 NOTE — Telephone Encounter (Signed)
Tried x 3. Mailbox is full. Will try again tomorrow.

## 2017-02-09 NOTE — Telephone Encounter (Signed)
Per last office note WBAT for lower extremities.

## 2017-02-09 NOTE — Telephone Encounter (Signed)
I called back. Mailbox is full. Unable to leave message

## 2017-02-09 NOTE — Telephone Encounter (Signed)
Nida Boatmanebecca Boggs from [facility] called wanting to know the weight bearing status of the hip for the patient. Please call to advise. [phone]

## 2017-02-10 NOTE — Telephone Encounter (Signed)
I called this morning. Mailbox is still full, unable to leave message.  I called [facility], no answer. Will try them back.

## 2017-02-10 NOTE — Telephone Encounter (Signed)
Unable to reach [facility] as well. Phone continues to ring. I will wait for call back.

## 2017-02-16 ENCOUNTER — Telehealth: Payer: Self-pay | Admitting: Family Medicine

## 2017-02-16 NOTE — Telephone Encounter (Signed)
Left pt message asking to call Allison back directly at [phone] to schedule AWV + labs with Lesia and CPE with PCP. °

## 2017-03-03 ENCOUNTER — Telehealth (INDEPENDENT_AMBULATORY_CARE_PROVIDER_SITE_OTHER): Payer: Self-pay

## 2017-03-03 NOTE — Telephone Encounter (Signed)
Therapy called to questions pt's wtb status. Please call to advise.

## 2017-03-03 NOTE — Telephone Encounter (Signed)
Please advise 

## 2017-03-03 NOTE — Telephone Encounter (Signed)
WBAT

## 2017-03-03 NOTE — Telephone Encounter (Signed)
I called and Jacob FanningJulie that he is weightbearing as tolerated

## 2017-03-15 DIAGNOSIS — R2689 Other abnormalities of gait and mobility: Secondary | ICD-10-CM | POA: Diagnosis not present

## 2017-03-28 ENCOUNTER — Emergency Department (HOSPITAL_COMMUNITY): Payer: PPO

## 2017-03-28 ENCOUNTER — Emergency Department (HOSPITAL_COMMUNITY)
Admission: EM | Admit: 2017-03-28 | Discharge: 2017-03-29 | Disposition: A | Discharge status: Home / Self Care | Payer: PPO | Attending: Emergency Medicine | Admitting: Emergency Medicine

## 2017-03-28 ENCOUNTER — Encounter (HOSPITAL_COMMUNITY): Payer: Self-pay | Admitting: Emergency Medicine

## 2017-03-28 DIAGNOSIS — W19XXXA Unspecified fall, initial encounter: Secondary | ICD-10-CM

## 2017-03-28 DIAGNOSIS — W06XXXA Fall from bed, initial encounter: Secondary | ICD-10-CM | POA: Diagnosis not present

## 2017-03-28 DIAGNOSIS — Z951 Presence of aortocoronary bypass graft: Secondary | ICD-10-CM | POA: Diagnosis not present

## 2017-03-28 DIAGNOSIS — Z23 Encounter for immunization: Secondary | ICD-10-CM | POA: Insufficient documentation

## 2017-03-28 DIAGNOSIS — S0990XA Unspecified injury of head, initial encounter: Secondary | ICD-10-CM | POA: Diagnosis not present

## 2017-03-28 DIAGNOSIS — M25552 Pain in left hip: Secondary | ICD-10-CM | POA: Diagnosis not present

## 2017-03-28 DIAGNOSIS — S0181XA Laceration without foreign body of other part of head, initial encounter: Secondary | ICD-10-CM | POA: Insufficient documentation

## 2017-03-28 DIAGNOSIS — E039 Hypothyroidism, unspecified: Secondary | ICD-10-CM | POA: Insufficient documentation

## 2017-03-28 DIAGNOSIS — Y92122 Bedroom in nursing home as the place of occurrence of the external cause: Secondary | ICD-10-CM | POA: Diagnosis not present

## 2017-03-28 DIAGNOSIS — S098XXA Other specified injuries of head, initial encounter: Secondary | ICD-10-CM | POA: Diagnosis not present

## 2017-03-28 DIAGNOSIS — I10 Essential (primary) hypertension: Secondary | ICD-10-CM | POA: Diagnosis not present

## 2017-03-28 DIAGNOSIS — M25551 Pain in right hip: Secondary | ICD-10-CM | POA: Diagnosis not present

## 2017-03-28 DIAGNOSIS — Z79899 Other long term (current) drug therapy: Secondary | ICD-10-CM | POA: Diagnosis not present

## 2017-03-28 DIAGNOSIS — I2510 Atherosclerotic heart disease of native coronary artery without angina pectoris: Secondary | ICD-10-CM | POA: Insufficient documentation

## 2017-03-28 DIAGNOSIS — S199XXA Unspecified injury of neck, initial encounter: Secondary | ICD-10-CM | POA: Diagnosis not present

## 2017-03-28 DIAGNOSIS — S79911A Unspecified injury of right hip, initial encounter: Secondary | ICD-10-CM | POA: Diagnosis not present

## 2017-03-28 DIAGNOSIS — Y999 Unspecified external cause status: Secondary | ICD-10-CM | POA: Diagnosis not present

## 2017-03-28 DIAGNOSIS — S79912A Unspecified injury of left hip, initial encounter: Secondary | ICD-10-CM | POA: Diagnosis not present

## 2017-03-28 DIAGNOSIS — Y939 Activity, unspecified: Secondary | ICD-10-CM | POA: Diagnosis not present

## 2017-03-28 DIAGNOSIS — S0180XA Unspecified open wound of other part of head, initial encounter: Secondary | ICD-10-CM | POA: Diagnosis not present

## 2017-03-28 MED ORDER — HALOPERIDOL LACTATE 5 MG/ML IJ SOLN
1.0000 mg | Freq: Once | INTRAMUSCULAR | Status: AC
  Administered 2017-03-28: 1 mg via INTRAMUSCULAR
  Filled 2017-03-28: qty 1

## 2017-03-28 MED ORDER — TETANUS-DIPHTH-ACELL PERTUSSIS 5-2.5-18.5 LF-MCG/0.5 IM SUSP
0.5000 mL | Freq: Once | INTRAMUSCULAR | Status: AC
  Administered 2017-03-29: 0.5 mL via INTRAMUSCULAR
  Filled 2017-03-28: qty 0.5

## 2017-03-28 NOTE — ED Notes (Signed)
Bed: WA20 Expected date:  Expected time:  Means of arrival:  Comments: EMS 83 yo male from SNF-fell earlier-10 minutes prior to EMS arrival-lac to forehead/does not speak English EBL 111

## 2017-03-28 NOTE — ED Notes (Signed)
Patient transported to X-ray 

## 2017-03-28 NOTE — ED Provider Notes (Signed)
[facility]-EMERGENCY DEPT Provider Note   CSN: [id number] Arrival date & time: 03/28/17  2145  By signing my name below, I, Rosario Adie, attest that this documentation has been prepared under the direction and in the presence of Alley Neils, MD. Electronically Signed: Rosario Adie, ED Scribe. 03/28/17. 11:12 PM.  History   Chief Complaint Chief Complaint  Patient presents with  . Fall   LEVEL V CAVEAT: HPI and ROS limited due to language barrier  The history is provided by the nursing home. The history is limited by a language barrier.  Fall  This is a new problem. The problem occurs constantly. The problem has not changed since onset.Nothing aggravates the symptoms. Nothing relieves the symptoms. He has tried nothing for the symptoms. The treatment provided no relief.    HPI Comments: Jacob [name] is a 83 y.o. male BIB EMS from [facility], who presents to the Emergency Department following witnessed, ground-level fall which occurred prior to arrival. Facility reports that he was being assisted out of bed when he tripped and fell forward onto his face. Facility denied any LOC. Pt is not currently on anticoagulant or antiplatelet therapy.   Past Medical History:  Diagnosis Date  . Acetabular fracture (HCC) 12/01/2016  . Acute ischemic left MCA stroke (HCC) 10/2015   "cognitive deficits mostly" (12/05/2016)  . Acute right MCA stroke (HCC) 09/23/2015  . Anemia   . Arthritis    "all over" (12/05/2016)  . C4 cervical fracture (HCC) 2015   with spinal cord contusion - stopped coumadin.  Marland Kitchen CAD in native artery -->  1994   referred for CABG  . Chronic atrial fibrillation (HCC) 12/15/2012  . Diverticulitis   . Dyslipidemia, goal LDL below 70[272.4]     - on statin, followed by primary physician.   . Essential hypertension 05/23/2013  . Glaucoma   . H/O seasonal allergies   . Hip fracture (HCC) 12/15/2015  . History of cardioembolic cerebrovascular accident (CVA)  10/28/2015  . History of kidney stones   . History of stomach ulcers   . Hypothyroidism   . Long term (current) use of anticoagulants 12/15/2012   warfarin  . Neck fracture (HCC) 2015   "no OR"  . S/P CABG x 4 1994   a) 1994: LIMA-LAD, SVG-RI, SVG-OM, SVG-RCA; b) 09/2006, Persantine Myoview: fixed inferior defect consistent with infarct/scar without peri-infarct ischemia;; 2-D echo: Mild-mod concentric LVH. Normal EF.  Abnormal relaxation, aortic sclerosis with mild-mod AI   Patient Active Problem List   Diagnosis Date Noted  . Pressure injury of skin 12/05/2016  . Acetabular fracture (HCC) 12/02/2016  . Distal radius fracture, right 12/02/2016  . Fall   . Urge urinary incontinence 10/06/2016  . Visual hallucinations 08/30/2016  . History of cardioembolic cerebrovascular accident (CVA) 10/28/2015  . Anemia 09/24/2015  . Nocturia associated with benign prostatic hyperplasia 02/24/2015  . Hypothyroidism 08/02/2014  . Dysphagia 08/02/2014  . Essential hypertension 05/23/2013  . CAD in native artery -->  CABG x4 in 1994   . 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 Dyslipidemia, goal LDL below 70 - on statin, followed by primary physician.   . Chronic atrial fibrillation (HCC) 12/15/2012    Class: Chronic   Past Surgical History:  Procedure Laterality Date  . APPENDECTOMY    . CARDIAC CATHETERIZATION    . CATARACT EXTRACTION W/ INTRAOCULAR LENS  IMPLANT, BILATERAL Bilateral   . CORONARY  ARTERY BYPASS GRAFT  1994   LIMA-LAD, SVG-RI, SVG-OM, SVG-RCA   . FEMUR SURGERY Left 2000   replacement- due to fall  . FINGER AMPUTATION Left 1985   "accident at work; ring finger amputation"  . FRACTURE SURGERY    . HIP PINNING,CANNULATED Right 12/16/2015   Procedure: CANNULATED HIP PINNING;  Surgeon: Deeann SaintHoward Miller, MD;  Location: [facility] ORS;  Service: Orthopedics;  Laterality: Right;  . Persantine Myoview  February 2008   Fixed inferior wall  defect-consistent with infarct/scar without peri-infarct ischemia  . TRANSTHORACIC ECHOCARDIOGRAM  February 2008   Mild/mod conc LVH, Nl Size & Fxn, Gr 1 DD(abnormal relaxation), mild to moderate aortic regurgitation and mildly sclerotic aortic valve.    Home Medications    Prior to Admission medications   Medication Sig Start Date End Date Taking? Authorizing Provider  acetaminophen (TYLENOL) 500 MG tablet Take 2 tablets (1,000 mg total) by mouth every 8 (eight) hours. 12/12/16  Yes Ngetich, Dinah C, NP  diclofenac sodium (VOLTAREN) 1 % GEL Apply 4 g topically 3 (three) times daily. To bilateral knees for pain   Yes [provider]  dorzolamide (TRUSOPT) 2 % ophthalmic solution Place 1 drop into both eyes 2 (two) times daily.  06/22/16  Yes [provider]  finasteride (PROSCAR) 5 MG tablet Take 1 tablet (5 mg total) by mouth daily. 11/24/16  Yes Eustaquio BoydenGutierrez, Javier, MD  levothyroxine (SYNTHROID, LEVOTHROID) 88 MCG tablet TAKE 1 TABLET (88 MCG TOTAL) BY MOUTH DAILY BEFORE BREAKFAST. 09/21/16  Yes Eustaquio BoydenGutierrez, Javier, MD  Multiple Vitamin (MULTIVITAMIN) tablet Take 1 tablet by mouth daily.   Yes [provider]  pantoprazole sodium (PROTONIX) 40 mg/20 mL PACK Place 40 mg into feeding tube daily.   Yes [provider]  polyethylene glycol (MIRALAX / GLYCOLAX) packet Take 17 g by mouth daily as needed for moderate constipation. 12/07/16  Yes Meuth, Brooke A, PA-C  simvastatin (ZOCOR) 20 MG tablet TAKE 1 TABLET (20 MG TOTAL) BY MOUTH DAILY. 06/13/16  Yes Jaffe, Adam R, DO  traMADol (ULTRAM) 50 MG tablet Take 25 mg by mouth 4 (four) times daily. Hold if sleepy or confused   Yes [provider]  latanoprost (XALATAN) 0.005 % ophthalmic solution Place 1 drop into both eyes at bedtime.    [provider]  LORazepam (ATIVAN) 1 MG tablet Take 1 mg by mouth at bedtime.    [provider]  mirabegron ER (MYRBETRIQ) 25 MG TB24 tablet Take 1 tablet (25  mg total) by mouth daily. 10/06/16   Eustaquio BoydenGutierrez, Javier, MD  pantoprazole (PROTONIX) 40 MG tablet Take 1 tablet (40 mg total) by mouth daily. Patient not taking: Reported on 03/28/2017 12/07/16   Carlena BjornstadMeuth, Brooke A, PA-C  tamsulosin (FLOMAX) 0.4 MG CAPS capsule Take 2 capsules (0.8 mg total) by mouth daily after supper. 11/24/16   Eustaquio BoydenGutierrez, Javier, MD  UNABLE TO FIND Med Name: Magic Cups by mouth 3 times daily    [provider]   Family History Family History  Problem Relation Age of Onset  . Liver cancer Mother   . Prostate cancer Father    Social History Social History  Substance Use Topics  . Smoking status: Never Smoker  . Smokeless tobacco: Never Used  . Alcohol use 0.0 oz/week     Comment: 12/05/2016 "might have a drink at Christmas"   Allergies   Patient has no known allergies.  Review of Systems Review of Systems  Unable to perform ROS: Other (language barrier)  Physical Exam Updated Vital Signs BP 140/64   Pulse 94   Temp 98.4 F (36.9 C) (Oral)   Resp (!) 21   SpO2 99%   Physical Exam  Constitutional: He appears well-developed and well-nourished.  HENT:  Head: Normocephalic. Head is without raccoon's eyes and without Battle's sign.  Right Ear: No hemotympanum.  Left Ear: No hemotympanum.  Mouth/Throat: Oropharynx is clear and moist. No oropharyngeal exudate.  Denuded skin tear at approximately 1cm to the right forehead.   Eyes: Pupils are equal, round, and reactive to light. Conjunctivae and EOM are normal. Right eye exhibits no discharge. Left eye exhibits no discharge. No scleral icterus.  Neck: Normal range of motion. Neck supple. No JVD present. No tracheal deviation present.  Trachea is midline. No stridor or carotid bruits.  Cardiovascular: Normal rate, regular rhythm, normal heart sounds and intact distal pulses.   No murmur heard. Pulmonary/Chest: Effort normal and breath sounds normal. No stridor. No respiratory distress. He has no wheezes. He has  no rales.  Lungs CTA bilaterally.  Abdominal: Soft. Bowel sounds are normal. He exhibits no distension. There is no tenderness. There is no rebound and no guarding.  Musculoskeletal: Normal range of motion. He exhibits no edema or tenderness.  All compartments are soft. No palpable cords. Pelvis stable.   Lymphadenopathy:    He has no cervical adenopathy.  Neurological: He is alert. He has normal reflexes. He displays normal reflexes.  Skin: Skin is warm and dry. Capillary refill takes less than 2 seconds.  Psychiatric: He has a normal mood and affect. His behavior is normal.  Nursing note and vitals reviewed.  ED Treatments / Results   Results for orders placed or performed in visit on 12/15/16  CBC and differential  Result Value Ref Range   Hemoglobin 9.1 (A) 13.5 - 17.5 g/dL   HCT 27 (A) 41 - 53 %   Platelets 515 (A) 150 - 399 K/L   WBC 12.2 10^3/mL  Basic metabolic panel  Result Value Ref Range   Glucose 113 mg/dL   BUN 36 (A) 4 - 21 mg/dL   Creatinine 1.3 0.6 - 1.3 mg/dL   Potassium 3.9 3.4 - 5.3 mmol/L   Sodium 147 137 - 147 mmol/L  Hepatic function panel  Result Value Ref Range   Alkaline Phosphatase 114 25 - 125 U/L   ALT 19 10 - 40 U/L   AST 25 14 - 40 U/L   Bilirubin, Total 1.0 mg/dL   No results found. Results for orders placed or performed in visit on 12/15/16  CBC and differential  Result Value Ref Range   Hemoglobin 9.1 (A) 13.5 - 17.5 g/dL   HCT 27 (A) 41 - 53 %   Platelets 515 (A) 150 - 399 K/L   WBC 12.2 10^3/mL  Basic metabolic panel  Result Value Ref Range   Glucose 113 mg/dL   BUN 36 (A) 4 - 21 mg/dL   Creatinine 1.3 0.6 - 1.3 mg/dL   Potassium 3.9 3.4 - 5.3 mmol/L   Sodium 147 137 - 147 mmol/L  Hepatic function panel  Result Value Ref Range   Alkaline Phosphatase 114 25 - 125 U/L   ALT 19 10 - 40 U/L   AST 25 14 - 40 U/L   Bilirubin, Total 1.0 mg/dL   Ct Head Wo Contrast  Result Date: 03/29/2017 CLINICAL DATA:  Patient fell. No loss of  consciousness. History of prior C4 fracture 2015. EXAM: CT HEAD WITHOUT CONTRAST  CT CERVICAL SPINE WITHOUT CONTRAST TECHNIQUE: Multidetector CT imaging of the head and cervical spine was performed following the standard protocol without intravenous contrast. Multiplanar CT image reconstructions of the cervical spine were also generated. COMPARISON:  12/01/2016 FINDINGS: CT HEAD FINDINGS Brain: Examination is technically limited due to motion artifact. There is diffuse bilateral cerebral atrophy. There is low attenuation change throughout the deep white matter consistent with small vessel ischemia. There is encephalomalacia in the left anterior frontal lobe likely representing an old infarct. No mass effect or midline shift. No ventricular dilatation. No abnormal extra-axial fluid collections. Gray-white matter junctions are distinct. Basal cisterns are not effaced. No acute intracranial hemorrhage. Vascular: Vascular calcifications are present. Skull: No depressed skull fractures. Sinuses/Orbits: No acute finding. Other: Small subcutaneous scalp hematoma over the right anterior frontal region. CT CERVICAL SPINE FINDINGS Alignment: Normal alignment. Skull base and vertebrae: No acute fracture. No primary bone lesion or focal pathologic process. Diffuse bone demineralization. Soft tissues and spinal canal: No prevertebral soft tissue swelling. No soft tissue mass or infiltration. No visible canal hematoma. Disc levels: Degenerative changes throughout the cervical spine with narrowed interspaces and endplate hypertrophic changes at all levels. Degenerative changes in the facet joints. Degenerative changes at C1 to levels. Prominent posterior endplate hypertrophic changes cause anterior effacement of the central canal at multiple levels. No definite bony stenosis of the central canal. Upper chest: Negative. Other: No significant changes since previous study. IMPRESSION: 1. No acute intracranial abnormalities. Chronic  atrophy and small vessel ischemic changes. Old encephalomalacia in the left frontal lobe consistent with old infarct in the distribution of the left middle cerebral artery. 2. Normal alignment of the cervical spine. Diffuse demineralization and degenerative changes. No acute displaced fractures identified. Electronically Signed   By: Burman NievesWilliam  Stevens M.D.   On: 03/29/2017 00:33   Ct Cervical Spine Wo Contrast  Result Date: 03/29/2017 CLINICAL DATA:  Patient fell. No loss of consciousness. History of prior C4 fracture 2015. EXAM: CT HEAD WITHOUT CONTRAST CT CERVICAL SPINE WITHOUT CONTRAST TECHNIQUE: Multidetector CT imaging of the head and cervical spine was performed following the standard protocol without intravenous contrast. Multiplanar CT image reconstructions of the cervical spine were also generated. COMPARISON:  12/01/2016 FINDINGS: CT HEAD FINDINGS Brain: Examination is technically limited due to motion artifact. There is diffuse bilateral cerebral atrophy. There is low attenuation change throughout the deep white matter consistent with small vessel ischemia. There is encephalomalacia in the left anterior frontal lobe likely representing an old infarct. No mass effect or midline shift. No ventricular dilatation. No abnormal extra-axial fluid collections. Gray-white matter junctions are distinct. Basal cisterns are not effaced. No acute intracranial hemorrhage. Vascular: Vascular calcifications are present. Skull: No depressed skull fractures. Sinuses/Orbits: No acute finding. Other: Small subcutaneous scalp hematoma over the right anterior frontal region. CT CERVICAL SPINE FINDINGS Alignment: Normal alignment. Skull base and vertebrae: No acute fracture. No primary bone lesion or focal pathologic process. Diffuse bone demineralization. Soft tissues and spinal canal: No prevertebral soft tissue swelling. No soft tissue mass or infiltration. No visible canal hematoma. Disc levels: Degenerative changes  throughout the cervical spine with narrowed interspaces and endplate hypertrophic changes at all levels. Degenerative changes in the facet joints. Degenerative changes at C1 to levels. Prominent posterior endplate hypertrophic changes cause anterior effacement of the central canal at multiple levels. No definite bony stenosis of the central canal. Upper chest: Negative. Other: No significant changes since previous study. IMPRESSION: 1. No acute intracranial abnormalities. Chronic atrophy and  small vessel ischemic changes. Old encephalomalacia in the left frontal lobe consistent with old infarct in the distribution of the left middle cerebral artery. 2. Normal alignment of the cervical spine. Diffuse demineralization and degenerative changes. No acute displaced fractures identified. Electronically Signed   By: Burman Nieves M.D.   On: 03/29/2017 00:33   Dg Hips Bilat W Or Wo Pelvis 2 Views  Result Date: 03/29/2017 CLINICAL DATA:  Left hip pain following a fall tonight. EXAM: DG HIP (WITH OR WITHOUT PELVIS) 2V BILAT COMPARISON:  None. FINDINGS: Diffuse osteopenia. Hardware fixation of both hips with old, healed fractures. No acute fracture or dislocation seen. Mild lower lumbar spine degenerative changes. IMPRESSION: No acute fracture or dislocation. Electronically Signed   By: Beckie Salts M.D.   On: 03/29/2017 00:06    Procedures Procedures    Final Clinical Impressions(s) / ED Diagnoses  Fall:  Strict return precautions given for weakness, difficulty ambulating, inability to tolerate oral medication,  Worsening of rash, swelling of the lips or face, shortness of breath,  syncope or any concerns. No signs of systemic illness or infection. The patient is nontoxic-appearing on exam and vital signs are within normal limits.   I have reviewed the triage vital signs and the nursing notes. Pertinent labs &imaging results that were available during my care of the patient were reviewed by me and  considered in my medical decision making (see chart for details).  After history, exam, and medical workup I feel the patient has been appropriately medically screened and is safe for discharge home. Pertinent diagnoses were discussed with the patient. Patient was given return precautions.  I personally performed the services described in this documentation, which was scribed in my presence. The recorded information has been reviewed and is accurate.      Madalyn Legner, MD 03/29/17 0040

## 2017-03-28 NOTE — ED Triage Notes (Signed)
[facility] BIB EMS [facility] for [facility]. Facility reports pt was being assisted into bed and fell forward on his face. Facility denies LOC. approximately 1 inch lac to rt forehead. Patient not on any blood thinners. Patient speaks only spanish, WALL-E at bedside.

## 2017-03-28 NOTE — ED Notes (Signed)
This RN used wall-e to assist in assessing pt and asking questions, patient closed his eyes and looked away and refused to answer questions at this time. When asked if he had any pain he stated "i have a lot of pain from the waist down".  Patient would give no further details.

## 2017-03-29 ENCOUNTER — Encounter (HOSPITAL_COMMUNITY): Payer: Self-pay | Admitting: Emergency Medicine

## 2017-03-29 DIAGNOSIS — S0990XA Unspecified injury of head, initial encounter: Secondary | ICD-10-CM | POA: Diagnosis not present

## 2017-03-29 DIAGNOSIS — S0000XA Unspecified superficial injury of scalp, initial encounter: Secondary | ICD-10-CM | POA: Diagnosis not present

## 2017-03-29 DIAGNOSIS — R4182 Altered mental status, unspecified: Secondary | ICD-10-CM | POA: Diagnosis not present

## 2017-03-29 DIAGNOSIS — S199XXA Unspecified injury of neck, initial encounter: Secondary | ICD-10-CM | POA: Diagnosis not present

## 2017-03-29 NOTE — ED Notes (Signed)
Vital signs delayed due to patient in CT.

## 2017-03-29 NOTE — ED Notes (Signed)
Cleaned the laceration on patients forehead and placed non-adhesive dressing.

## 2017-03-29 NOTE — ED Notes (Signed)
Report called to facility.  [facility] called for transportation.

## 2017-04-04 DIAGNOSIS — N390 Urinary tract infection, site not specified: Secondary | ICD-10-CM | POA: Diagnosis not present

## 2017-04-04 DIAGNOSIS — Z79899 Other long term (current) drug therapy: Secondary | ICD-10-CM | POA: Diagnosis not present

## 2017-04-06 DIAGNOSIS — R2689 Other abnormalities of gait and mobility: Secondary | ICD-10-CM | POA: Diagnosis not present

## 2017-04-13 DIAGNOSIS — R2689 Other abnormalities of gait and mobility: Secondary | ICD-10-CM | POA: Diagnosis not present

## 2017-04-17 DIAGNOSIS — R2689 Other abnormalities of gait and mobility: Secondary | ICD-10-CM | POA: Diagnosis not present

## 2017-04-24 DIAGNOSIS — R2689 Other abnormalities of gait and mobility: Secondary | ICD-10-CM | POA: Diagnosis not present

## 2017-04-29 DIAGNOSIS — R1312 Dysphagia, oropharyngeal phase: Secondary | ICD-10-CM | POA: Diagnosis not present

## 2017-05-10 ENCOUNTER — Emergency Department (HOSPITAL_COMMUNITY): Payer: PPO

## 2017-05-10 ENCOUNTER — Emergency Department (HOSPITAL_COMMUNITY)
Admission: EM | Admit: 2017-05-10 | Discharge: 2017-05-10 | Disposition: A | Discharge status: Home / Self Care | Payer: PPO | Attending: Emergency Medicine | Admitting: Emergency Medicine

## 2017-05-10 ENCOUNTER — Encounter (HOSPITAL_COMMUNITY): Payer: Self-pay | Admitting: *Deleted

## 2017-05-10 DIAGNOSIS — W19XXXA Unspecified fall, initial encounter: Secondary | ICD-10-CM

## 2017-05-10 DIAGNOSIS — G8911 Acute pain due to trauma: Secondary | ICD-10-CM | POA: Diagnosis not present

## 2017-05-10 DIAGNOSIS — Z951 Presence of aortocoronary bypass graft: Secondary | ICD-10-CM | POA: Diagnosis not present

## 2017-05-10 DIAGNOSIS — E039 Hypothyroidism, unspecified: Secondary | ICD-10-CM | POA: Insufficient documentation

## 2017-05-10 DIAGNOSIS — Y939 Activity, unspecified: Secondary | ICD-10-CM | POA: Insufficient documentation

## 2017-05-10 DIAGNOSIS — I2510 Atherosclerotic heart disease of native coronary artery without angina pectoris: Secondary | ICD-10-CM | POA: Insufficient documentation

## 2017-05-10 DIAGNOSIS — F0391 Unspecified dementia with behavioral disturbance: Secondary | ICD-10-CM | POA: Diagnosis not present

## 2017-05-10 DIAGNOSIS — F0390 Unspecified dementia without behavioral disturbance: Secondary | ICD-10-CM | POA: Diagnosis not present

## 2017-05-10 DIAGNOSIS — Y999 Unspecified external cause status: Secondary | ICD-10-CM | POA: Diagnosis not present

## 2017-05-10 DIAGNOSIS — I10 Essential (primary) hypertension: Secondary | ICD-10-CM | POA: Insufficient documentation

## 2017-05-10 DIAGNOSIS — Y92122 Bedroom in nursing home as the place of occurrence of the external cause: Secondary | ICD-10-CM | POA: Diagnosis not present

## 2017-05-10 DIAGNOSIS — F29 Unspecified psychosis not due to a substance or known physiological condition: Secondary | ICD-10-CM | POA: Diagnosis not present

## 2017-05-10 DIAGNOSIS — W1830XA Fall on same level, unspecified, initial encounter: Secondary | ICD-10-CM | POA: Insufficient documentation

## 2017-05-10 DIAGNOSIS — R4182 Altered mental status, unspecified: Secondary | ICD-10-CM | POA: Diagnosis not present

## 2017-05-10 DIAGNOSIS — S0990XA Unspecified injury of head, initial encounter: Secondary | ICD-10-CM | POA: Diagnosis not present

## 2017-05-10 DIAGNOSIS — S199XXA Unspecified injury of neck, initial encounter: Secondary | ICD-10-CM | POA: Diagnosis not present

## 2017-05-10 HISTORY — DX: Dysphasia: R47.02

## 2017-05-10 HISTORY — DX: Unspecified dementia, unspecified severity, without behavioral disturbance, psychotic disturbance, mood disturbance, and anxiety: F03.90

## 2017-05-10 NOTE — Discharge Instructions (Signed)
You were seen in the Emergency Department (ED) today for a head injury.  Based on your evaluation, you may have sustained a concussion (or bruise) to your brain.  If you had a CT scan done, it did not show any evidence of serious injury or bleeding.   ° °Symptoms to expect from a concussion include nausea, mild to moderate headache, difficulty concentrating or sleeping, and mild lightheadedness.  These symptoms should improve over the next few days to weeks, but it may take many weeks before you feel back to normal.  Return to the emergency department or follow-up with your primary care doctor if your symptoms are not improving over this time. ° °Signs of a more serious head injury include vomiting, severe headache, excessive sleepiness or confusion, and weakness or numbness in your face, arms or legs.  Return immediately to the Emergency Department if you experience any of these more concerning symptoms.   ° ° °

## 2017-05-10 NOTE — ED Triage Notes (Signed)
[facility] was called to [facility] for a fall for a witnessed/unwitnessed fall/slide out of a bed.  Old bruising to shoulders.  Hx of dementia.  CBG 120.  156/68, hr 62, rr 20.  O2 sats 96%, temp 97.

## 2017-05-10 NOTE — ED Provider Notes (Signed)
Emergency Department Provider Note   I have reviewed the triage vital signs and the nursing notes.   HISTORY  Chief Complaint No chief complaint on file.   HPI Jacob [name] is a 83 y.o. male with PMH of prior CVA, CAD, HLD, history of prior falls with old fractures, and chronic a-fib presents to the department for evaluation after fall. The patient is a resident at [facility]. [facility] stated the fall was unwitnessed. Staff thinks that he may have slipped out of bed. Patient has not had history of recent fevers. Does have a history of significant dementia.   Level 5 caveat: Dementia  Past Medical History:  Diagnosis Date  . Acetabular fracture (HCC) 12/01/2016  . Acute ischemic left MCA stroke (HCC) 10/2015   "cognitive deficits mostly" (12/05/2016)  . Acute right MCA stroke (HCC) 09/23/2015  . Anemia   . Arthritis    "all over" (12/05/2016)  . C4 cervical fracture (HCC) 2015   with spinal cord contusion - stopped coumadin.  Marland Kitchen CAD in native artery -->  1994   referred for CABG  . Chronic atrial fibrillation (HCC) 12/15/2012  . Dementia   . Diverticulitis   . Dyslipidemia, goal LDL below 70[272.4]     - on statin, followed by primary physician.   Marland Kitchen Dysphasia   . Essential hypertension 05/23/2013  . Glaucoma   . H/O seasonal allergies   . Hip fracture (HCC) 12/15/2015  . History of cardioembolic cerebrovascular accident (CVA) 10/28/2015  . History of kidney stones   . History of stomach ulcers   . Hypothyroidism   . Nayelis Bonito term (current) use of anticoagulants 12/15/2012   warfarin  . Neck fracture (HCC) 2015   "no OR"  . S/P CABG x 4 1994   a) 1994: LIMA-LAD, SVG-RI, SVG-OM, SVG-RCA; b) 09/2006, Persantine Myoview: fixed inferior defect consistent with infarct/scar without peri-infarct ischemia;; 2-D echo: Mild-mod concentric LVH. Normal EF.  Abnormal relaxation, aortic sclerosis with mild-mod AI    Patient Active Problem List   Diagnosis Date Noted  .  Pressure injury of skin 12/05/2016  . Acetabular fracture (HCC) 12/02/2016  . Distal radius fracture, right 12/02/2016  . Fall   . Urge urinary incontinence 10/06/2016  . Visual hallucinations 08/30/2016  . History of cardioembolic cerebrovascular accident (CVA) 10/28/2015  . Anemia 09/24/2015  . Nocturia associated with benign prostatic hyperplasia 02/24/2015  . Hypothyroidism 08/02/2014  . Dysphagia 08/02/2014  . Essential hypertension 05/23/2013  . CAD in native artery -->  CABG x4 in 1994   . 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 Dyslipidemia, goal LDL below 70 - on statin, followed by primary physician.   . Chronic atrial fibrillation (HCC) 12/15/2012    Class: Chronic    Past Surgical History:  Procedure Laterality Date  . APPENDECTOMY    . CARDIAC CATHETERIZATION    . CATARACT EXTRACTION W/ INTRAOCULAR LENS  IMPLANT, BILATERAL Bilateral   . CORONARY ARTERY BYPASS GRAFT  1994   LIMA-LAD, SVG-RI, SVG-OM, SVG-RCA   . FEMUR SURGERY Left 2000   replacement- due to fall  . FINGER AMPUTATION Left 1985   "accident at work; ring finger amputation"  . FRACTURE SURGERY    . HIP PINNING,CANNULATED Right 12/16/2015   Procedure: CANNULATED HIP PINNING;  Surgeon: Deeann Saint, MD;  Location: [facility] ORS;  Service: Orthopedics;  Laterality: Right;  . Persantine Myoview  February 2008  Fixed inferior wall defect-consistent with infarct/scar without peri-infarct ischemia  . TRANSTHORACIC ECHOCARDIOGRAM  February 2008   Mild/mod conc LVH, Nl Size & Fxn, Gr 1 DD(abnormal relaxation), mild to moderate aortic regurgitation and mildly sclerotic aortic valve.    Current Outpatient Rx  . Order #: [id number] Class: No Print  . Order #: [id number] Class: Historical Med  . Order #: [id number] Class: Historical Med  . Order #: [id number] Class: Normal  . Order #: [id number] Class: Historical Med  . Order #: [id number] Class: Normal  . Order #: [id number] Class:  Historical Med  . Order #: [id number] Class: Normal  . Order #: [id number] Class: Historical Med  . Order #: [id number] Class: No Print  . Order #: [id number] Class: Historical Med  . Order #: [id number] Class: No Print  . Order #: [id number] Class: Normal  . Order #: [id number] Class: Normal  . Order #: [id number] Class: Historical Med  . Order #: [id number] Class: Historical Med    Allergies Patient has no known allergies.  Family History  Problem Relation Age of Onset  . Liver cancer Mother   . Prostate cancer Father     Social History Social History  Substance Use Topics  . Smoking status: Never Smoker  . Smokeless tobacco: Never Used  . Alcohol use 0.0 oz/week     Comment: 12/05/2016 "might have a drink at Christmas"    Review of Systems  Level 5 caveat: Dementia.  ____________________________________________   PHYSICAL EXAM:  VITAL SIGNS: ED Triage Vitals [05/10/17 1651]  Enc Vitals Group     BP (!) 142/85     Pulse Rate 90     Resp 16     SpO2 99 %   Constitutional: Drowsy but awakens to voice. Confused.  Eyes: Conjunctivae are normal. PERRL.  Head: Atraumatic. Nose: No congestion/rhinnorhea. Mouth/Throat: Mucous membranes are moist.  Neck: No stridor.   Cardiovascular: Normal rate, regular rhythm. Good peripheral circulation. Grossly normal heart sounds.   Respiratory: Normal respiratory effort.  No retractions. Lungs CTAB. Gastrointestinal: Soft and nontender. No distention.  Musculoskeletal: No lower extremity tenderness nor edema. No gross deformities of extremities. Full ROM of bilateral shoulders and hips.  Neurologic: Confused per patient's baseline. Spontaneously moving all extremities.  Skin:  Skin is warm, dry and intact. No rash noted.  ____________________________________________  RADIOLOGY  Ct Head Wo Contrast  Result Date: 05/10/2017 CLINICAL DATA:  Fall. EXAM: CT HEAD WITHOUT CONTRAST CT CERVICAL SPINE WITHOUT CONTRAST TECHNIQUE: Multidetector CT  imaging of the head and cervical spine was performed following the standard protocol without intravenous contrast. Multiplanar CT image reconstructions of the cervical spine were also generated. COMPARISON:  03/28/2017 FINDINGS: CT HEAD FINDINGS Brain: There is atrophy and chronic small vessel disease changes. Old infarct with encephalomalacia in the left frontal lobe, stable. No acute intracranial abnormality. Specifically, no hemorrhage, hydrocephalus, mass lesion, acute infarction, or significant intracranial injury. Vascular: No hyperdense vessel or unexpected calcification. Skull: No acute calvarial abnormality. Sinuses/Orbits: Visualized paranasal sinuses and mastoids clear. Orbital soft tissues unremarkable. Other: None CT CERVICAL SPINE FINDINGS Alignment: Retrolisthesis of C2 on C3 and C3 on C4 related to facet disease. Skull base and vertebrae: No fracture Soft tissues and spinal canal: Prevertebral soft tissues are normal. No epidural or paraspinal hematoma. Disc levels: Advanced degenerative disc and facet disease bilaterally. Fusion of the posterior spinous processes from C3-C5. Upper chest: Negative Other: Carotid artery calcifications. IMPRESSION: Atrophy, chronic small vessel disease. Old left frontal infarct with encephalomalacia. No acute intracranial abnormality. Degenerative disc and facet disease throughout the cervical spine. No acute bony  abnormality. Electronically Signed   By: Charlett Nose M.D.   On: 05/10/2017 17:54   Ct Cervical Spine Wo Contrast  Result Date: 05/10/2017 CLINICAL DATA:  Fall. EXAM: CT HEAD WITHOUT CONTRAST CT CERVICAL SPINE WITHOUT CONTRAST TECHNIQUE: Multidetector CT imaging of the head and cervical spine was performed following the standard protocol without intravenous contrast. Multiplanar CT image reconstructions of the cervical spine were also generated. COMPARISON:  03/28/2017 FINDINGS: CT HEAD FINDINGS Brain: There is atrophy and chronic small vessel disease  changes. Old infarct with encephalomalacia in the left frontal lobe, stable. No acute intracranial abnormality. Specifically, no hemorrhage, hydrocephalus, mass lesion, acute infarction, or significant intracranial injury. Vascular: No hyperdense vessel or unexpected calcification. Skull: No acute calvarial abnormality. Sinuses/Orbits: Visualized paranasal sinuses and mastoids clear. Orbital soft tissues unremarkable. Other: None CT CERVICAL SPINE FINDINGS Alignment: Retrolisthesis of C2 on C3 and C3 on C4 related to facet disease. Skull base and vertebrae: No fracture Soft tissues and spinal canal: Prevertebral soft tissues are normal. No epidural or paraspinal hematoma. Disc levels: Advanced degenerative disc and facet disease bilaterally. Fusion of the posterior spinous processes from C3-C5. Upper chest: Negative Other: Carotid artery calcifications. IMPRESSION: Atrophy, chronic small vessel disease. Old left frontal infarct with encephalomalacia. No acute intracranial abnormality. Degenerative disc and facet disease throughout the cervical spine. No acute bony abnormality. Electronically Signed   By: Charlett Nose M.D.   On: 05/10/2017 17:54    ____________________________________________   PROCEDURES  Procedure(s) performed:   Procedures  None ____________________________________________   INITIAL IMPRESSION / ASSESSMENT AND PLAN / ED COURSE  Pertinent labs & imaging results that were available during my care of the patient were reviewed by me and considered in my medical decision making (see chart for details).  Patient presents emergency permit for evaluation after an unwitnessed fall out of bed. Patient has underlying dementia and is Spanish speaking. Unable to provide significant history. Will attempt to reach staff at [facility] for more information. CT head and C-spine ordered. Old appearing bruising on shoulders. Normal ROM. No indication for shoulder or chest imaging at this time.     06:11 PM Called and spoke with nurse familiar with the patient at [facility]. She reports that the patient is at his mental status baseline. No agitation or confusion above normal. CT imaging negative for acute injury. Plan for transport back to SNF.   At this time, I do not feel there is any life-threatening condition present. I have reviewed and discussed all results (EKG, imaging, lab, urine as appropriate), exam findings with patient. I have reviewed nursing notes and appropriate previous records.  I feel the patient is safe to be discharged home without further emergent workup.   ____________________________________________  FINAL CLINICAL IMPRESSION(S) / ED DIAGNOSES  Final diagnoses:  Fall, initial encounter  Injury of head, initial encounter     MEDICATIONS GIVEN DURING THIS VISIT:  None  NEW OUTPATIENT MEDICATIONS STARTED DURING THIS VISIT:  None   Note:  This document was prepared using Dragon voice recognition software and may include unintentional dictation errors.  Alona Bene, MD Emergency Medicine    Tylene Quashie, Arlyss Repress, MD 05/10/17 2020

## 2017-05-15 ENCOUNTER — Emergency Department (HOSPITAL_COMMUNITY): Payer: PPO

## 2017-05-15 ENCOUNTER — Encounter (HOSPITAL_COMMUNITY): Payer: Self-pay

## 2017-05-15 ENCOUNTER — Emergency Department (HOSPITAL_COMMUNITY)
Admission: EM | Admit: 2017-05-15 | Discharge: 2017-05-15 | Disposition: A | Discharge status: Home / Self Care | Payer: PPO | Attending: Emergency Medicine | Admitting: Emergency Medicine

## 2017-05-15 DIAGNOSIS — Y929 Unspecified place or not applicable: Secondary | ICD-10-CM | POA: Insufficient documentation

## 2017-05-15 DIAGNOSIS — Y999 Unspecified external cause status: Secondary | ICD-10-CM | POA: Diagnosis not present

## 2017-05-15 DIAGNOSIS — S0101XA Laceration without foreign body of scalp, initial encounter: Secondary | ICD-10-CM

## 2017-05-15 DIAGNOSIS — S51811A Laceration without foreign body of right forearm, initial encounter: Secondary | ICD-10-CM | POA: Diagnosis not present

## 2017-05-15 DIAGNOSIS — S1093XA Contusion of unspecified part of neck, initial encounter: Secondary | ICD-10-CM

## 2017-05-15 DIAGNOSIS — Y9389 Activity, other specified: Secondary | ICD-10-CM | POA: Diagnosis not present

## 2017-05-15 DIAGNOSIS — S199XXA Unspecified injury of neck, initial encounter: Secondary | ICD-10-CM | POA: Diagnosis not present

## 2017-05-15 DIAGNOSIS — R4182 Altered mental status, unspecified: Secondary | ICD-10-CM | POA: Diagnosis not present

## 2017-05-15 DIAGNOSIS — R9431 Abnormal electrocardiogram [ECG] [EKG]: Secondary | ICD-10-CM | POA: Diagnosis not present

## 2017-05-15 DIAGNOSIS — I10 Essential (primary) hypertension: Secondary | ICD-10-CM | POA: Insufficient documentation

## 2017-05-15 DIAGNOSIS — D649 Anemia, unspecified: Secondary | ICD-10-CM | POA: Diagnosis not present

## 2017-05-15 DIAGNOSIS — W19XXXA Unspecified fall, initial encounter: Secondary | ICD-10-CM

## 2017-05-15 DIAGNOSIS — Z8673 Personal history of transient ischemic attack (TIA), and cerebral infarction without residual deficits: Secondary | ICD-10-CM | POA: Insufficient documentation

## 2017-05-15 DIAGNOSIS — I4891 Unspecified atrial fibrillation: Secondary | ICD-10-CM | POA: Diagnosis not present

## 2017-05-15 DIAGNOSIS — E039 Hypothyroidism, unspecified: Secondary | ICD-10-CM | POA: Insufficient documentation

## 2017-05-15 DIAGNOSIS — Z7901 Long term (current) use of anticoagulants: Secondary | ICD-10-CM | POA: Diagnosis not present

## 2017-05-15 DIAGNOSIS — Z79899 Other long term (current) drug therapy: Secondary | ICD-10-CM | POA: Insufficient documentation

## 2017-05-15 DIAGNOSIS — S0181XA Laceration without foreign body of other part of head, initial encounter: Secondary | ICD-10-CM | POA: Diagnosis not present

## 2017-05-15 DIAGNOSIS — X58XXXA Exposure to other specified factors, initial encounter: Secondary | ICD-10-CM | POA: Diagnosis not present

## 2017-05-15 DIAGNOSIS — I2510 Atherosclerotic heart disease of native coronary artery without angina pectoris: Secondary | ICD-10-CM | POA: Diagnosis not present

## 2017-05-15 DIAGNOSIS — F0390 Unspecified dementia without behavioral disturbance: Secondary | ICD-10-CM | POA: Insufficient documentation

## 2017-05-15 DIAGNOSIS — S0990XA Unspecified injury of head, initial encounter: Secondary | ICD-10-CM | POA: Diagnosis not present

## 2017-05-15 DIAGNOSIS — R296 Repeated falls: Secondary | ICD-10-CM | POA: Diagnosis not present

## 2017-05-15 DIAGNOSIS — G8911 Acute pain due to trauma: Secondary | ICD-10-CM | POA: Diagnosis not present

## 2017-05-15 MED ORDER — HALOPERIDOL LACTATE 5 MG/ML IJ SOLN
2.0000 mg | Freq: Once | INTRAMUSCULAR | Status: AC
Start: 2017-05-15 — End: 2017-05-15
  Administered 2017-05-15: 2 mg via INTRAMUSCULAR
  Filled 2017-05-15: qty 1

## 2017-05-15 NOTE — ED Provider Notes (Signed)
[facility]-EMERGENCY DEPT Provider Note   CSN: [id number] Arrival date & time: 05/15/17  4098     History   Chief Complaint Chief Complaint  Patient presents with  . Fall    HPI Jacob [name] is a 83 y.o. male.  HPI Jacob [name] is a 83 y.o. malewith history of dementia, CVA, coronary disease, A. Fib, cervical spine fracture, presents to emergency department after a fall. Patient had 2 episodes of falling down from standing this morning. Patient is at baseline demented. He denies any complaints. History of the same. Patient sustained an abrasion to the right forehead and skin tears to the right elbow which was bandaged by the nursing home staff. Pt unable to provide any hx  Past Medical History:  Diagnosis Date  . Acetabular fracture (HCC) 12/01/2016  . Acute ischemic left MCA stroke (HCC) 10/2015   "cognitive deficits mostly" (12/05/2016)  . Acute right MCA stroke (HCC) 09/23/2015  . Anemia   . Arthritis    "all over" (12/05/2016)  . C4 cervical fracture (HCC) 2015   with spinal cord contusion - stopped coumadin.  Marland Kitchen CAD in native artery -->  1994   referred for CABG  . Chronic atrial fibrillation (HCC) 12/15/2012  . Dementia   . Diverticulitis   . Dyslipidemia, goal LDL below 70[272.4]     - on statin, followed by primary physician.   Marland Kitchen Dysphasia   . Essential hypertension 05/23/2013  . Glaucoma   . H/O seasonal allergies   . Hip fracture (HCC) 12/15/2015  . History of cardioembolic cerebrovascular accident (CVA) 10/28/2015  . History of kidney stones   . History of stomach ulcers   . Hypothyroidism   . Long term (current) use of anticoagulants 12/15/2012   warfarin  . Neck fracture (HCC) 2015   "no OR"  . S/P CABG x 4 1994   a) 1994: LIMA-LAD, SVG-RI, SVG-OM, SVG-RCA; b) 09/2006, Persantine Myoview: fixed inferior defect consistent with infarct/scar without peri-infarct ischemia;; 2-D echo: Mild-mod concentric LVH. Normal EF.  Abnormal relaxation, aortic sclerosis with  mild-mod AI    Patient Active Problem List   Diagnosis Date Noted  . Pressure injury of skin 12/05/2016  . Acetabular fracture (HCC) 12/02/2016  . Distal radius fracture, right 12/02/2016  . Fall   . Urge urinary incontinence 10/06/2016  . Visual hallucinations 08/30/2016  . History of cardioembolic cerebrovascular accident (CVA) 10/28/2015  . Anemia 09/24/2015  . Nocturia associated with benign prostatic hyperplasia 02/24/2015  . Hypothyroidism 08/02/2014  . Dysphagia 08/02/2014  . Essential hypertension 05/23/2013  . CAD in native artery -->  CABG x4 in 1994   . 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 Dyslipidemia, goal LDL below 70 - on statin, followed by primary physician.   . Chronic atrial fibrillation (HCC) 12/15/2012    Class: Chronic    Past Surgical History:  Procedure Laterality Date  . APPENDECTOMY    . CARDIAC CATHETERIZATION    . CATARACT EXTRACTION W/ INTRAOCULAR LENS  IMPLANT, BILATERAL Bilateral   . CORONARY ARTERY BYPASS GRAFT  1994   LIMA-LAD, SVG-RI, SVG-OM, SVG-RCA   . FEMUR SURGERY Left 2000   replacement- due to fall  . FINGER AMPUTATION Left 1985   "accident at work; ring finger amputation"  . FRACTURE SURGERY    . HIP PINNING,CANNULATED Right 12/16/2015   Procedure: CANNULATED HIP PINNING;  Surgeon: Deeann Saint, MD;  Location: [facility] ORS;  Service: Orthopedics;  Laterality: Right;  . Persantine Myoview  February 2008   Fixed inferior wall defect-consistent with infarct/scar without peri-infarct ischemia  . TRANSTHORACIC ECHOCARDIOGRAM  February 2008   Mild/mod conc LVH, Nl Size & Fxn, Gr 1 DD(abnormal relaxation), mild to moderate aortic regurgitation and mildly sclerotic aortic valve.       Home Medications    Prior to Admission medications   Medication Sig Start Date End Date Taking? Authorizing Provider  acetaminophen (TYLENOL) 500 MG tablet Take 2 tablets (1,000 mg total) by mouth every 8 (eight)  hours. 12/12/16  Yes Ngetich, Dinah C, NP  diclofenac sodium (VOLTAREN) 1 % GEL Apply 4 g topically 3 (three) times daily. To bilateral knees for pain   Yes [provider]  dorzolamide (TRUSOPT) 2 % ophthalmic solution Place 1 drop into both eyes 2 (two) times daily.  06/22/16  Yes [provider]  finasteride (PROSCAR) 5 MG tablet Take 1 tablet (5 mg total) by mouth daily. 11/24/16  Yes Eustaquio Boyden, MD  latanoprost (XALATAN) 0.005 % ophthalmic solution Place 1 drop into both eyes at bedtime.   Yes [provider]  levothyroxine (SYNTHROID, LEVOTHROID) 88 MCG tablet TAKE 1 TABLET (88 MCG TOTAL) BY MOUTH DAILY BEFORE BREAKFAST. 09/21/16  Yes Eustaquio Boyden, MD  LORazepam (ATIVAN) 1 MG tablet Take 1 mg by mouth at bedtime.   Yes [provider]  mirabegron ER (MYRBETRIQ) 25 MG TB24 tablet Take 1 tablet (25 mg total) by mouth daily. 10/06/16  Yes Eustaquio Boyden, MD  mirtazapine (REMERON) 7.5 MG tablet Take 7.5 mg by mouth at bedtime. 05/09/17  Yes [provider]  Multiple Vitamin (MULTIVITAMIN) tablet Take 1 tablet by mouth daily.   Yes [provider]  omeprazole (PRILOSEC) 20 MG capsule Take 20 mg by mouth daily.   Yes [provider]  pantoprazole sodium (PROTONIX) 40 mg/20 mL PACK Place 40 mg into feeding tube daily.   Yes [provider]  polyethylene glycol (MIRALAX / GLYCOLAX) packet Take 17 g by mouth daily as needed for moderate constipation. 12/07/16  Yes Meuth, Brooke A, PA-C  simvastatin (ZOCOR) 20 MG tablet TAKE 1 TABLET (20 MG TOTAL) BY MOUTH DAILY. 06/13/16  Yes Jaffe, Adam R, DO  tamsulosin (FLOMAX) 0.4 MG CAPS capsule Take 2 capsules (0.8 mg total) by mouth daily after supper. 11/24/16  Yes Eustaquio Boyden, MD  traMADol (ULTRAM) 50 MG tablet Take 25 mg by mouth 2 (two) times daily. Hold if sleepy or confused    Yes [provider]  UNABLE TO FIND Med Name: Magic Cups by mouth 3 times daily   Yes  [provider]  pantoprazole (PROTONIX) 40 MG tablet Take 1 tablet (40 mg total) by mouth daily. Patient not taking: Reported on 03/28/2017 12/07/16   Franne Forts, PA-C    Family History Family History  Problem Relation Age of Onset  . Liver cancer Mother   . Prostate cancer Father     Social History Social History  Substance Use Topics  . Smoking status: Never Smoker  . Smokeless tobacco: Never Used  . Alcohol use 0.0 oz/week     Comment: 12/05/2016 "might have a drink at Christmas"     Allergies   Patient has no known allergies.   Review of Systems Review of Systems  Unable to perform ROS: Dementia     Physical Exam Updated Vital Signs BP 140/60 (BP Location: Right Arm)   Pulse 87   Temp (!) 97.5  F (36.4 C) (Axillary) Comment: Pt not cooperating with oral temp.  RN  wanted axillary.  Resp 18   Ht  (1.702 m)   Wt 63.5 kg (140 lb)   SpO2 100%   BMI 21.93 kg/m   Physical Exam  Constitutional: He appears well-developed and well-nourished. No distress.  HENT:  Head: Normocephalic.  Skin tear to right forehead  Eyes: Pupils are equal, round, and reactive to light. Conjunctivae and EOM are normal.  Neck: Neck supple.  Cardiovascular: Normal rate, regular rhythm and normal heart sounds.   Pulmonary/Chest: Effort normal. No respiratory distress. He has no wheezes. He has no rales.  Abdominal: Soft. Bowel sounds are normal. He exhibits no distension. There is no tenderness. There is no rebound.  Musculoskeletal: He exhibits no edema.  No obvious step offs or deformity to spine. Full rom of bilateral shoulders, elbows, wrist. Difficulty with ROm at pelvis and knees, unclear if acute.   Neurological: He is alert.  Disoriented. Moving all extremities, does not follow direction  Skin: Skin is warm and dry.  Nursing note and vitals reviewed.    ED Treatments / Results  Labs (all labs ordered are listed, but only abnormal results are  displayed) Labs Reviewed - No data to display  EKG  EKG Interpretation None       Radiology Dg Pelvis 1-2 Views  Result Date: 05/15/2017 CLINICAL DATA:  Trauma secondary due to two falls this morning. EXAM: PELVIS - 1-2 VIEW COMPARISON:  01/18/2017 FINDINGS: The patient has old healed bilateral hip fractures as well as a healed right acetabular fracture. Osteopenia. No acute abnormality. IMPRESSION: No acute abnormalities.  Old healed fractures. Electronically Signed   By: Francene Boyers M.D.   On: 05/15/2017 12:10   Ct Head Wo Contrast  Result Date: 05/15/2017 CLINICAL DATA:  Status post fall x2 this morning. EXAM: CT HEAD WITHOUT CONTRAST CT CERVICAL SPINE WITHOUT CONTRAST TECHNIQUE: Multidetector CT imaging of the head and cervical spine was performed following the standard protocol without intravenous contrast. Multiplanar CT image reconstructions of the cervical spine were also generated. COMPARISON:  Head and cervical spine CT scan 05/10/2017. FINDINGS: CT HEAD FINDINGS Brain: Extensive chronic microvascular ischemic change and a remote left frontal infarct are again seen. No evidence of acute infarct, hemorrhage, mass lesion, mass effect, midline shift or abnormal extra-axial fluid collection. No hydrocephalus or pneumocephalus. Vascular: Atherosclerosis noted. Skull: Negative. Sinuses/Orbits: Status post bilateral lens extraction. No acute abnormality. Other: None. CT CERVICAL SPINE FINDINGS Alignment: 0.2 cm degenerative retrolisthesis C2 on C3 is noted Skull base and vertebrae: No acute fracture. No primary bone lesion or focal pathologic process. Soft tissues and spinal canal: No prevertebral fluid or swelling. No visible canal hematoma. Disc levels:  Multilevel loss of disc space height is identified Upper chest: Lung apices are clear. Other: None. IMPRESSION: No acute abnormality head or cervical spine. Chronic microvascular ischemic change and remote left frontal infarct. Multilevel  cervical degenerative disease. Atherosclerosis. Electronically Signed   By: Drusilla Kanner M.D.   On: 05/15/2017 12:28   Ct Cervical Spine Wo Contrast  Result Date: 05/15/2017 CLINICAL DATA:  Status post fall x2 this morning. EXAM: CT HEAD WITHOUT CONTRAST CT CERVICAL SPINE WITHOUT CONTRAST TECHNIQUE: Multidetector CT imaging of the head and cervical spine was performed following the standard protocol without intravenous contrast. Multiplanar CT image reconstructions of the cervical spine were also generated. COMPARISON:  Head and cervical spine CT scan 05/10/2017. FINDINGS: CT HEAD FINDINGS Brain: Extensive chronic  microvascular ischemic change and a remote left frontal infarct are again seen. No evidence of acute infarct, hemorrhage, mass lesion, mass effect, midline shift or abnormal extra-axial fluid collection. No hydrocephalus or pneumocephalus. Vascular: Atherosclerosis noted. Skull: Negative. Sinuses/Orbits: Status post bilateral lens extraction. No acute abnormality. Other: None. CT CERVICAL SPINE FINDINGS Alignment: 0.2 cm degenerative retrolisthesis C2 on C3 is noted Skull base and vertebrae: No acute fracture. No primary bone lesion or focal pathologic process. Soft tissues and spinal canal: No prevertebral fluid or swelling. No visible canal hematoma. Disc levels:  Multilevel loss of disc space height is identified Upper chest: Lung apices are clear. Other: None. IMPRESSION: No acute abnormality head or cervical spine. Chronic microvascular ischemic change and remote left frontal infarct. Multilevel cervical degenerative disease. Atherosclerosis. Electronically Signed   By: Drusilla Kanner M.D.   On: 05/15/2017 12:28    Procedures Procedures (including critical care time)  Medications Ordered in ED Medications - No data to display   Initial Impression / Assessment and Plan / ED Course  I have reviewed the triage vital signs and the nursing notes.  Pertinent labs & imaging results  that were available during my care of the patient were reviewed by me and considered in my medical decision making (see chart for details).     Patient in emergency department after a fall. He is demented at baseline. He has large contusion to the neck,in c-collar. He has an abrasion to the forehead and skin tear to the right forearm. Will monitor, CT head, cervical spine, x-ray pelvis ordered.   Patient unable to cooperate for imaging, discussed with Dr. Freida Busman, will give 2 mg of Haldol IM.   1:17 PM Imaging is negative for acute abnormality. Patient is son-in-law is at bedside, states the patient is at baseline. I do not think he needs any further testing or imaging in department. Discharged home back to the facility. He appears to be neurovascularly intact. Moving all extremities.   Vitals:   05/15/17 0955 05/15/17 1006 05/15/17 1024 05/15/17 1231  BP:  140/60  (!) 172/65  Pulse:  87  97  Resp:  18  15  Temp:   (!) 97.5 F (36.4 C) 97.6 F (36.4 C)  TempSrc:   Axillary   SpO2: 100% 100%  100%  Weight:  63.5 kg (140 lb)    Height:   (1.702 m)       Final Clinical Impressions(s) / ED Diagnoses   Final diagnoses:  Fall, initial encounter  Contusion of neck, initial encounter  Laceration of scalp without foreign body, initial encounter  Skin tear of right forearm without complication, initial encounter    New Prescriptions Discharge Medication List as of 05/15/2017  1:15 PM       Jaynie Crumble, PA-C 05/15/17 1530

## 2017-05-15 NOTE — ED Notes (Signed)
Report given to Jacob [name] at [facility]. Jacob [name] made aware that pt will be returning and that all of his results came back normal.

## 2017-05-15 NOTE — Discharge Instructions (Signed)
Wound care to lacerations. Follow up as needed.

## 2017-05-15 NOTE — ED Notes (Signed)
[facility] called for transport.  

## 2017-05-15 NOTE — ED Triage Notes (Signed)
Pt presents via EMS from [facility] with c/o fall. EMS reports pt had 2 witnessed falls this morning. Pt is spanish speaking with a hx of dementia. Pt had no LOC with the falls. Pt does have a skin tear to the right forehead and right elbow, bandaged prior to EMS arrival by facility staff. Pt has a c-collar on placed by EMS as a precaution.

## 2017-05-15 NOTE — ED Notes (Signed)
Pt is going for a second attempt at xray and CT after receiving Haldol.

## 2017-05-15 NOTE — ED Notes (Signed)
Patient transported to CT and xray 

## 2017-05-15 NOTE — ED Notes (Signed)
Bed: [facility] Expected date:  Expected time:  Means of arrival:  Comments: EMS seizure, dementia; Room 20

## 2017-05-15 NOTE — ED Notes (Signed)
Xray tech reported that pt was too combative for xray or CTs to be completed. Tatyana PA made aware.

## 2017-05-16 DIAGNOSIS — R2689 Other abnormalities of gait and mobility: Secondary | ICD-10-CM | POA: Diagnosis not present

## 2017-06-21 DIAGNOSIS — R351 Nocturia: Secondary | ICD-10-CM | POA: Diagnosis not present

## 2017-06-21 DIAGNOSIS — N401 Enlarged prostate with lower urinary tract symptoms: Secondary | ICD-10-CM | POA: Diagnosis not present

## 2017-08-08 DIAGNOSIS — M6281 Muscle weakness (generalized): Secondary | ICD-10-CM | POA: Diagnosis not present

## 2017-08-16 DIAGNOSIS — N390 Urinary tract infection, site not specified: Secondary | ICD-10-CM | POA: Diagnosis not present

## 2017-08-16 DIAGNOSIS — Z79899 Other long term (current) drug therapy: Secondary | ICD-10-CM | POA: Diagnosis not present

## 2017-08-18 DIAGNOSIS — Z79899 Other long term (current) drug therapy: Secondary | ICD-10-CM | POA: Diagnosis not present

## 2017-09-17 ENCOUNTER — Emergency Department (HOSPITAL_COMMUNITY): Payer: 59

## 2017-09-17 ENCOUNTER — Other Ambulatory Visit: Payer: Self-pay

## 2017-09-17 ENCOUNTER — Encounter (HOSPITAL_COMMUNITY): Payer: Self-pay | Admitting: Emergency Medicine

## 2017-09-17 ENCOUNTER — Emergency Department (HOSPITAL_COMMUNITY)
Admission: EM | Admit: 2017-09-17 | Discharge: 2017-09-17 | Disposition: A | Discharge status: Home / Self Care | Payer: 59 | Attending: Emergency Medicine | Admitting: Emergency Medicine

## 2017-09-17 DIAGNOSIS — Z8673 Personal history of transient ischemic attack (TIA), and cerebral infarction without residual deficits: Secondary | ICD-10-CM | POA: Diagnosis not present

## 2017-09-17 DIAGNOSIS — S0181XA Laceration without foreign body of other part of head, initial encounter: Secondary | ICD-10-CM | POA: Insufficient documentation

## 2017-09-17 DIAGNOSIS — Y92129 Unspecified place in nursing home as the place of occurrence of the external cause: Secondary | ICD-10-CM | POA: Insufficient documentation

## 2017-09-17 DIAGNOSIS — W19XXXA Unspecified fall, initial encounter: Secondary | ICD-10-CM

## 2017-09-17 DIAGNOSIS — F0390 Unspecified dementia without behavioral disturbance: Secondary | ICD-10-CM | POA: Insufficient documentation

## 2017-09-17 DIAGNOSIS — I10 Essential (primary) hypertension: Secondary | ICD-10-CM | POA: Insufficient documentation

## 2017-09-17 DIAGNOSIS — I2510 Atherosclerotic heart disease of native coronary artery without angina pectoris: Secondary | ICD-10-CM | POA: Diagnosis not present

## 2017-09-17 DIAGNOSIS — Z79899 Other long term (current) drug therapy: Secondary | ICD-10-CM | POA: Diagnosis not present

## 2017-09-17 DIAGNOSIS — Z23 Encounter for immunization: Secondary | ICD-10-CM | POA: Insufficient documentation

## 2017-09-17 DIAGNOSIS — Y999 Unspecified external cause status: Secondary | ICD-10-CM | POA: Diagnosis not present

## 2017-09-17 DIAGNOSIS — S0990XA Unspecified injury of head, initial encounter: Secondary | ICD-10-CM

## 2017-09-17 DIAGNOSIS — Z7901 Long term (current) use of anticoagulants: Secondary | ICD-10-CM | POA: Diagnosis not present

## 2017-09-17 DIAGNOSIS — Z951 Presence of aortocoronary bypass graft: Secondary | ICD-10-CM | POA: Insufficient documentation

## 2017-09-17 DIAGNOSIS — W0110XA Fall on same level from slipping, tripping and stumbling with subsequent striking against unspecified object, initial encounter: Secondary | ICD-10-CM | POA: Diagnosis not present

## 2017-09-17 DIAGNOSIS — Y939 Activity, unspecified: Secondary | ICD-10-CM | POA: Insufficient documentation

## 2017-09-17 MED ORDER — LIDOCAINE-EPINEPHRINE (PF) 2 %-1:200000 IJ SOLN
10.0000 mL | Freq: Once | INTRAMUSCULAR | Status: AC
  Administered 2017-09-17: 10 mL
  Filled 2017-09-17: qty 20

## 2017-09-17 MED ORDER — TETANUS-DIPHTH-ACELL PERTUSSIS 5-2.5-18.5 LF-MCG/0.5 IM SUSP
0.5000 mL | Freq: Once | INTRAMUSCULAR | Status: AC
  Administered 2017-09-17: 0.5 mL via INTRAMUSCULAR
  Filled 2017-09-17: qty 0.5

## 2017-09-17 MED ORDER — BACITRACIN ZINC 500 UNIT/GM EX OINT: 1.0000 "application " | TOPICAL_OINTMENT | Freq: Two times a day (BID) | CUTANEOUS | 0 refills | Status: AC

## 2017-09-17 MED ORDER — BACITRACIN ZINC 500 UNIT/GM EX OINT
TOPICAL_OINTMENT | Freq: Two times a day (BID) | CUTANEOUS | Status: DC
  Administered 2017-09-17: 1 via TOPICAL
  Filled 2017-09-17: qty 0.9

## 2017-09-17 MED ORDER — LORAZEPAM 2 MG/ML IJ SOLN
1.0000 mg | Freq: Once | INTRAMUSCULAR | Status: AC
  Administered 2017-09-17: 1 mg via INTRAMUSCULAR
  Filled 2017-09-17: qty 1

## 2017-09-17 NOTE — ED Provider Notes (Signed)
[facility]-EMERGENCY DEPT Provider Note   CSN: [id number] Arrival date & time: 09/17/17  0219     History   Chief Complaint Chief Complaint  Patient presents with  . Fall  . Laceration    HPI Jacob [name] is a 84 y.o. male.  HPI  84 year old demented patient comes in after a witnessed fall. Level 5 caveat for dementia.  According to EMS report, patient was being assisted at the nursing home, when patient slipped and fell.  Patient fell face first, and has a laceration to his forehead and to the left hand.  [facility]  Past Medical History:  Diagnosis Date  . Acetabular fracture (HCC) 12/01/2016  . Acute ischemic left MCA stroke (HCC) 10/2015   "cognitive deficits mostly" (12/05/2016)  . Acute right MCA stroke (HCC) 09/23/2015  . Anemia   . Arthritis    "all over" (12/05/2016)  . C4 cervical fracture (HCC) 2015   with spinal cord contusion - stopped coumadin.  Marland Kitchen. CAD in native artery -->  1994   referred for CABG  . Chronic atrial fibrillation (HCC) 12/15/2012  . Dementia   . Diverticulitis   . Dyslipidemia, goal LDL below 70[272.4]     - on statin, followed by primary physician.   Marland Kitchen. Dysphasia   . Essential hypertension 05/23/2013  . Glaucoma   . H/O seasonal allergies   . Hip fracture (HCC) 12/15/2015  . History of cardioembolic cerebrovascular accident (CVA) 10/28/2015  . History of kidney stones   . History of stomach ulcers   . Hypothyroidism   . Long term (current) use of anticoagulants 12/15/2012   warfarin  . Neck fracture (HCC) 2015   "no OR"  . S/P CABG x 4 1994   a) 1994: LIMA-LAD, SVG-RI, SVG-OM, SVG-RCA; b) 09/2006, Persantine Myoview: fixed inferior defect consistent with infarct/scar without peri-infarct ischemia;; 2-D echo: Mild-mod concentric LVH. Normal EF.  Abnormal relaxation, aortic sclerosis with mild-mod AI    Patient Active Problem List   Diagnosis Date Noted  . Pressure  injury of skin 12/05/2016  . Acetabular fracture (HCC) 12/02/2016  . Distal radius fracture, right 12/02/2016  . Fall   . Urge urinary incontinence 10/06/2016  . Visual hallucinations 08/30/2016  . History of cardioembolic cerebrovascular accident (CVA) 10/28/2015  . Anemia 09/24/2015  . Nocturia associated with benign prostatic hyperplasia 02/24/2015  . Hypothyroidism 08/02/2014  . Dysphagia 08/02/2014  . Essential hypertension 05/23/2013  . CAD in native artery -->  CABG x4 in 1994   . 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 Dyslipidemia, goal LDL below 70 - on statin, followed by primary physician.   . Chronic atrial fibrillation (HCC) 12/15/2012    Class: Chronic    Past Surgical History:  Procedure Laterality Date  . APPENDECTOMY    . CARDIAC CATHETERIZATION    . CATARACT EXTRACTION W/ INTRAOCULAR LENS  IMPLANT, BILATERAL Bilateral   . CORONARY ARTERY BYPASS GRAFT  1994   LIMA-LAD, SVG-RI, SVG-OM, SVG-RCA   . FEMUR SURGERY Left 2000   replacement- due to fall  . FINGER AMPUTATION Left 1985   "accident at work; ring finger amputation"  . FRACTURE SURGERY    . HIP PINNING,CANNULATED Right 12/16/2015   Procedure: CANNULATED HIP PINNING;  Surgeon: Deeann SaintHoward Miller, MD;  Location: [facility] ORS;  Service: Orthopedics;  Laterality: Right;  . Persantine Myoview  February  2008   Fixed inferior wall defect-consistent with infarct/scar without peri-infarct ischemia  . TRANSTHORACIC ECHOCARDIOGRAM  February 2008   Mild/mod conc LVH, Nl Size & Fxn, Gr 1 DD(abnormal relaxation), mild to moderate aortic regurgitation and mildly sclerotic aortic valve.       Home Medications    Prior to Admission medications   Medication Sig Start Date End Date Taking? Authorizing Provider  acetaminophen (TYLENOL) 500 MG tablet Take 2 tablets (1,000 mg total) by mouth every 8 (eight) hours. 12/12/16   Ngetich, Dinah C, NP  bacitracin ointment Apply 1 application  topically 2 (two) times daily. 09/17/17   Derwood Kaplan, MD  diclofenac sodium (VOLTAREN) 1 % GEL Apply 4 g topically 3 (three) times daily. To bilateral knees for pain    [provider]  dorzolamide (TRUSOPT) 2 % ophthalmic solution Place 1 drop into both eyes 2 (two) times daily.  06/22/16   [provider]  finasteride (PROSCAR) 5 MG tablet Take 1 tablet (5 mg total) by mouth daily. 11/24/16   Eustaquio Boyden, MD  latanoprost (XALATAN) 0.005 % ophthalmic solution Place 1 drop into both eyes at bedtime.    [provider]  levothyroxine (SYNTHROID, LEVOTHROID) 88 MCG tablet TAKE 1 TABLET (88 MCG TOTAL) BY MOUTH DAILY BEFORE BREAKFAST. 09/21/16   Eustaquio Boyden, MD  LORazepam (ATIVAN) 1 MG tablet Take 1 mg by mouth at bedtime.    [provider]  mirabegron ER (MYRBETRIQ) 25 MG TB24 tablet Take 1 tablet (25 mg total) by mouth daily. 10/06/16   Eustaquio Boyden, MD  mirtazapine (REMERON) 7.5 MG tablet Take 7.5 mg by mouth at bedtime. 05/09/17   [provider]  Multiple Vitamin (MULTIVITAMIN) tablet Take 1 tablet by mouth daily.    [provider]  omeprazole (PRILOSEC) 20 MG capsule Take 20 mg by mouth daily.    [provider]  pantoprazole (PROTONIX) 40 MG tablet Take 1 tablet (40 mg total) by mouth daily. Patient not taking: Reported on 03/28/2017 12/07/16   Carlena Bjornstad A, PA-C  pantoprazole sodium (PROTONIX) 40 mg/20 mL PACK Place 40 mg into feeding tube daily.    [provider]  polyethylene glycol (MIRALAX / GLYCOLAX) packet Take 17 g by mouth daily as needed for moderate constipation. 12/07/16   Meuth, Brooke A, PA-C  simvastatin (ZOCOR) 20 MG tablet TAKE 1 TABLET (20 MG TOTAL) BY MOUTH DAILY. 06/13/16   Drema Dallas, DO  tamsulosin (FLOMAX) 0.4 MG CAPS capsule Take 2 capsules (0.8 mg total) by mouth daily after supper. 11/24/16   Eustaquio Boyden, MD  traMADol (ULTRAM) 50 MG tablet Take 25 mg by mouth 2 (two) times  daily. Hold if sleepy or confused     [provider]  UNABLE TO FIND Med Name: Magic Cups by mouth 3 times daily    [provider]    Family History Family History  Problem Relation Age of Onset  . Liver cancer Mother   . Prostate cancer Father     Social History Social History   Tobacco Use  . Smoking status: Never Smoker  . Smokeless tobacco: Never Used  Substance Use Topics  . Alcohol use: Yes    Alcohol/week: 0.0 oz    Comment: 12/05/2016 "might have a drink at Christmas"  . Drug use: No     Allergies   Patient has no known allergies.   Review of Systems Review of Systems  Unable to perform ROS: Dementia     Physical  Exam Updated Vital Signs BP (!) 143/72 (BP Location: Right Arm)   Pulse 84   Temp 97.8 F (36.6 C) (Oral)   Resp 18   Ht 5\' 6"  (1.676 m)   Wt 59 kg (130 lb)   SpO2 100%   BMI 20.98 kg/m   Physical Exam  Constitutional: He is oriented to person, place, and time. He appears well-developed.  HENT:  Head: Atraumatic.  Neck: Neck supple.  Cardiovascular: Normal rate.  Pulmonary/Chest: Effort normal.  Musculoskeletal:  4 cm laceration to the forehead, laceration is irregular /stellate.  Patient also has skin tear over large part of the hand on the left side.  Patient is tenderness to palpation of the hand.  OTHERWISE:  Head to toe evaluation shows no hematoma, bleeding of the scalp, no facial abrasions, no spine step offs, crepitus of the chest or neck, no tenderness to palpation of the bilateral upper and lower extremities, no gross deformities, no chest tenderness, no pelvic pain.   Neurological: He is alert and oriented to person, place, and time.  Skin: Skin is warm.  Nursing note and vitals reviewed.    ED Treatments / Results  Labs (all labs ordered are listed, but only abnormal results are displayed) Labs Reviewed - No data to display  EKG  EKG Interpretation None       Radiology Ct Head Wo  Contrast  Result Date: 09/17/2017 CLINICAL DATA:  Fall with laceration to anterior scalp. EXAM: CT HEAD WITHOUT CONTRAST CT CERVICAL SPINE WITHOUT CONTRAST TECHNIQUE: Multidetector CT imaging of the head and cervical spine was performed following the standard protocol without intravenous contrast. Multiplanar CT image reconstructions of the cervical spine were also generated. COMPARISON:  CT head and cervical spine 05/15/2017 FINDINGS: CT HEAD FINDINGS Brain: No acute intracranial hemorrhage. There is an old left frontal lobe infarct. There is hypoattenuation within the left occipital lobe that is new compared to 05/15/2017, but there is no associated edema. In fact, there is volume loss at this site. There is periventricular hypoattenuation compatible with chronic microvascular disease. No hydrocephalus. No midline shift or other mass effect. Vascular: No hyperdense vessel. Atherosclerotic calcification of the vertebral and internal carotid arteries at the skull base. Skull: Normal visualized skull base, calvarium and extracranial soft tissues. Sinuses/Orbits: No sinus fluid levels or advanced mucosal thickening. No mastoid effusion. Normal orbits. CT CERVICAL SPINE FINDINGS Alignment: No static subluxation. Facets are aligned. Occipital condyles are normally positioned. Skull base and vertebrae: No acute fracture. Soft tissues and spinal canal: No prevertebral fluid or swelling. No visible canal hematoma. Disc levels: No bony spinal canal stenosis. There is ossification of the posterior longitudinal ligament at the C3-C4 level. Multilevel facet hypertrophy with at least moderate foraminal stenosis bilaterally at C3-4 and C6-7. Upper chest: No pneumothorax, pulmonary nodule or pleural effusion. Other: Normal visualized paraspinal cervical soft tissues. IMPRESSION: 1. No acute intracranial hemorrhage. 2. Left occipital lobe infarct is new compared to 05/15/2017, but is not acute. Old left frontal lobe infarct. 3.  Cervical degenerative disc disease and facet arthrosis without acute fracture or static subluxation. Electronically Signed   By: Deatra Robinson M.D.   On: 09/17/2017 04:01   Ct Cervical Spine Wo Contrast  Result Date: 09/17/2017 CLINICAL DATA:  Fall with laceration to anterior scalp. EXAM: CT HEAD WITHOUT CONTRAST CT CERVICAL SPINE WITHOUT CONTRAST TECHNIQUE: Multidetector CT imaging of the head and cervical spine was performed following the standard protocol without intravenous contrast. Multiplanar CT image reconstructions of the cervical  spine were also generated. COMPARISON:  CT head and cervical spine 05/15/2017 FINDINGS: CT HEAD FINDINGS Brain: No acute intracranial hemorrhage. There is an old left frontal lobe infarct. There is hypoattenuation within the left occipital lobe that is new compared to 05/15/2017, but there is no associated edema. In fact, there is volume loss at this site. There is periventricular hypoattenuation compatible with chronic microvascular disease. No hydrocephalus. No midline shift or other mass effect. Vascular: No hyperdense vessel. Atherosclerotic calcification of the vertebral and internal carotid arteries at the skull base. Skull: Normal visualized skull base, calvarium and extracranial soft tissues. Sinuses/Orbits: No sinus fluid levels or advanced mucosal thickening. No mastoid effusion. Normal orbits. CT CERVICAL SPINE FINDINGS Alignment: No static subluxation. Facets are aligned. Occipital condyles are normally positioned. Skull base and vertebrae: No acute fracture. Soft tissues and spinal canal: No prevertebral fluid or swelling. No visible canal hematoma. Disc levels: No bony spinal canal stenosis. There is ossification of the posterior longitudinal ligament at the C3-C4 level. Multilevel facet hypertrophy with at least moderate foraminal stenosis bilaterally at C3-4 and C6-7. Upper chest: No pneumothorax, pulmonary nodule or pleural effusion. Other: Normal visualized  paraspinal cervical soft tissues. IMPRESSION: 1. No acute intracranial hemorrhage. 2. Left occipital lobe infarct is new compared to 05/15/2017, but is not acute. Old left frontal lobe infarct. 3. Cervical degenerative disc disease and facet arthrosis without acute fracture or static subluxation. Electronically Signed   By: Deatra Robinson M.D.   On: 09/17/2017 04:01    Procedures Procedures (including critical care time) LACERATION REPAIR Performed by: Damonica Chopra Authorized by: Rashell Shambaugh Consent: Verbal consent obtained. Risks and benefits: risks, benefits and alternatives were discussed Consent given by: patient Patient identity confirmed: provided demographic data Prepped and Draped in normal sterile fashion Wound explored  Laceration Location: forehead  Laceration Length: 3 cm  No Foreign Bodies seen or palpated  Anesthesia: local infiltration  Local anesthetic: lidocaine 1 % with epinephrine  Anesthetic total: 2 ml  Irrigation method: syringe Amount of cleaning: standard  Skin closure: primary  Number of sutures: 4 x 4-0 Vicryl  Technique: simple inturrupted  Patient tolerance: Patient tolerated the procedure well with no immediate complications.   Medications Ordered in ED Medications  bacitracin ointment (not administered)  lidocaine-EPINEPHrine (XYLOCAINE W/EPI) 2 %-1:200000 (PF) injection 10 mL (10 mLs Infiltration Given 09/17/17 0415)  Tdap (BOOSTRIX) injection 0.5 mL (0.5 mLs Intramuscular Given 09/17/17 0409)  LORazepam (ATIVAN) injection 1 mg (1 mg Intramuscular Given 09/17/17 0444)     Initial Impression / Assessment and Plan / ED Course  I have reviewed the triage vital signs and the nursing notes.  Pertinent labs & imaging results that were available during my care of the patient were reviewed by me and considered in my medical decision making (see chart for details).     DDx includes: - Mechanical falls - ICH - Fractures - Contusions -  Soft tissue injury  Patient comes in with chief complaint of fall.  Patient has sustained skin tear to the left hand, and he also has a laceration to the forehead.  Patient is a stellate type, irregular laceration to the forehead due to blunt trauma.  Patient was agitated and combative, we give him IM Ativan, and put in sutures to the best of her ability.  Dissolvable stitches placed.  CT scan of the head and C-spine are normal.  Patient only would comply with hand x-ray, and we were able to get one view only which  is normal.  Final Clinical Impressions(s) / ED Diagnoses   Final diagnoses:  Facial laceration, initial encounter  Fall, initial encounter  Injury of head, initial encounter    ED Discharge Orders        Ordered    bacitracin ointment  2 times daily     09/17/17 0548       Derwood Kaplan, MD 09/17/17 [phone]

## 2017-09-17 NOTE — Discharge Instructions (Signed)
[name] was sent to the ER after a fall.  He has skin tear to the left hand, without any fractures.  He also had laceration to his face, which was repaired to the best of our ability.  CT scan of the head and C-spine are normal.  The stitches will dissolve on their own. Please apply dressing to the hand and to the forehead twice a day.

## 2017-09-17 NOTE — ED Triage Notes (Signed)
Per EMS [facility] for [facility] of fall with laceration to anterior forehead and left wrist. Bleeding controlled by EMS. C-Collar in place on EMS.

## 2017-10-03 ENCOUNTER — Emergency Department (HOSPITAL_COMMUNITY)
Admission: EM | Admit: 2017-10-03 | Discharge: 2017-10-03 | Disposition: A | Discharge status: Home / Self Care | Payer: 59 | Attending: Emergency Medicine | Admitting: Emergency Medicine

## 2017-10-03 ENCOUNTER — Other Ambulatory Visit: Payer: Self-pay

## 2017-10-03 ENCOUNTER — Emergency Department (HOSPITAL_COMMUNITY): Payer: 59

## 2017-10-03 ENCOUNTER — Encounter (HOSPITAL_COMMUNITY): Payer: Self-pay

## 2017-10-03 DIAGNOSIS — I10 Essential (primary) hypertension: Secondary | ICD-10-CM | POA: Insufficient documentation

## 2017-10-03 DIAGNOSIS — Z951 Presence of aortocoronary bypass graft: Secondary | ICD-10-CM | POA: Diagnosis not present

## 2017-10-03 DIAGNOSIS — E039 Hypothyroidism, unspecified: Secondary | ICD-10-CM | POA: Insufficient documentation

## 2017-10-03 DIAGNOSIS — W01198A Fall on same level from slipping, tripping and stumbling with subsequent striking against other object, initial encounter: Secondary | ICD-10-CM | POA: Insufficient documentation

## 2017-10-03 DIAGNOSIS — Z79899 Other long term (current) drug therapy: Secondary | ICD-10-CM | POA: Diagnosis not present

## 2017-10-03 DIAGNOSIS — Y92129 Unspecified place in nursing home as the place of occurrence of the external cause: Secondary | ICD-10-CM | POA: Diagnosis not present

## 2017-10-03 DIAGNOSIS — S0003XA Contusion of scalp, initial encounter: Secondary | ICD-10-CM | POA: Diagnosis not present

## 2017-10-03 DIAGNOSIS — Y9389 Activity, other specified: Secondary | ICD-10-CM | POA: Diagnosis not present

## 2017-10-03 DIAGNOSIS — Y999 Unspecified external cause status: Secondary | ICD-10-CM | POA: Insufficient documentation

## 2017-10-03 DIAGNOSIS — F0390 Unspecified dementia without behavioral disturbance: Secondary | ICD-10-CM | POA: Insufficient documentation

## 2017-10-03 DIAGNOSIS — I2510 Atherosclerotic heart disease of native coronary artery without angina pectoris: Secondary | ICD-10-CM | POA: Insufficient documentation

## 2017-10-03 DIAGNOSIS — S0990XA Unspecified injury of head, initial encounter: Secondary | ICD-10-CM | POA: Diagnosis present

## 2017-10-03 NOTE — ED Notes (Signed)
Wd care performed to forehead and R elbow as ordered by MD. SN performed wd care as follows: cleansed with NS, patted dry, applied pertroleum gauze, covered with dsd, secured with roll gauze and tape.

## 2017-10-03 NOTE — ED Triage Notes (Signed)
Pt arrived via [facility]; per EMS, [facility] came [facility]; pt had witnessed fall while being pushed in wheelchair, patient attempted to stand up and fell face first; pt did not experience any LOC; pt currently not on blood thinners but has hx of AFIB; pt has laceration to R temporal region and R elbow skin tear; pt does have "brace" to L wrist and bandage to L forehead from previous fall. Pt is spanish speaking only and has hx of being combative

## 2017-10-03 NOTE — ED Notes (Signed)
Pt discharged from ED; [facility]; Pt encouraged to return to ED if symptoms worsen and to f/u with PCP; Pt verbalized understanding of all instructions

## 2017-10-03 NOTE — Discharge Instructions (Signed)
CT of the head and cervical spine showed no acute injury today.  Laceration to the forehead was well approximated, hemostatic and did not need sutures today.  Last tetanus vaccination updated in January 2019.

## 2017-10-03 NOTE — ED Provider Notes (Signed)
TIME SEEN: 1:43 AM  CHIEF COMPLAINT: Fall  HPI: Patient is a 84 year old male with history of hypertension, atrial fibrillation not on anticoagulation, CAD, stroke, dementia who lives at [facility] who had a witnessed fall.  They report that he got up from his wheelchair and fell forward striking his head on the ground.  No loss of consciousness.  ROS: Level 5 caveat for dementia  PAST MEDICAL HISTORY/PAST SURGICAL HISTORY:  Past Medical History:  Diagnosis Date  . Acetabular fracture (HCC) 12/01/2016  . Acute ischemic left MCA stroke (HCC) 10/2015   "cognitive deficits mostly" (12/05/2016)  . Acute right MCA stroke (HCC) 09/23/2015  . Anemia   . Arthritis    "all over" (12/05/2016)  . C4 cervical fracture (HCC) 2015   with spinal cord contusion - stopped coumadin.  Marland Kitchen CAD in native artery -->  1994   referred for CABG  . Chronic atrial fibrillation (HCC) 12/15/2012  . Dementia   . Diverticulitis   . Dyslipidemia, goal LDL below 70[272.4]     - on statin, followed by primary physician.   Marland Kitchen Dysphasia   . Essential hypertension 05/23/2013  . Glaucoma   . H/O seasonal allergies   . Hip fracture (HCC) 12/15/2015  . History of cardioembolic cerebrovascular accident (CVA) 10/28/2015  . History of kidney stones   . History of stomach ulcers   . Hypothyroidism   . Long term (current) use of anticoagulants 12/15/2012   warfarin  . Neck fracture (HCC) 2015   "no OR"  . S/P CABG x 4 1994   a) 1994: LIMA-LAD, SVG-RI, SVG-OM, SVG-RCA; b) 09/2006, Persantine Myoview: fixed inferior defect consistent with infarct/scar without peri-infarct ischemia;; 2-D echo: Mild-mod concentric LVH. Normal EF.  Abnormal relaxation, aortic sclerosis with mild-mod AI    MEDICATIONS:  Prior to Admission medications   Medication Sig Start Date End Date Taking? Authorizing Provider  acetaminophen (TYLENOL) 500 MG tablet Take 2 tablets (1,000 mg total) by mouth every 8 (eight) hours. 12/12/16   Ngetich, Dinah C, NP   bacitracin ointment Apply 1 application topically 2 (two) times daily. 09/17/17   Derwood Kaplan, MD  diclofenac sodium (VOLTAREN) 1 % GEL Apply 4 g topically 3 (three) times daily. To bilateral knees for pain    [provider]  dorzolamide (TRUSOPT) 2 % ophthalmic solution Place 1 drop into both eyes 2 (two) times daily.  06/22/16   [provider]  finasteride (PROSCAR) 5 MG tablet Take 1 tablet (5 mg total) by mouth daily. 11/24/16   Eustaquio Boyden, MD  latanoprost (XALATAN) 0.005 % ophthalmic solution Place 1 drop into both eyes at bedtime.    [provider]  levothyroxine (SYNTHROID, LEVOTHROID) 88 MCG tablet TAKE 1 TABLET (88 MCG TOTAL) BY MOUTH DAILY BEFORE BREAKFAST. 09/21/16   Eustaquio Boyden, MD  LORazepam (ATIVAN) 1 MG tablet Take 1 mg by mouth at bedtime.    [provider]  mirabegron ER (MYRBETRIQ) 25 MG TB24 tablet Take 1 tablet (25 mg total) by mouth daily. 10/06/16   Eustaquio Boyden, MD  mirtazapine (REMERON) 7.5 MG tablet Take 7.5 mg by mouth at bedtime. 05/09/17   [provider]  Multiple Vitamin (MULTIVITAMIN) tablet Take 1 tablet by mouth daily.    [provider]  omeprazole (PRILOSEC) 20 MG capsule Take 20 mg by mouth daily.    [provider]  pantoprazole (PROTONIX) 40 MG tablet Take 1 tablet (40 mg total) by mouth daily. Patient not taking: Reported on 03/28/2017  12/07/16   Meuth, Brooke A, PA-C  pantoprazole sodium (PROTONIX) 40 mg/20 mL PACK Place 40 mg into feeding tube daily.    [provider]  polyethylene glycol (MIRALAX / GLYCOLAX) packet Take 17 g by mouth daily as needed for moderate constipation. 12/07/16   Meuth, Brooke A, PA-C  simvastatin (ZOCOR) 20 MG tablet TAKE 1 TABLET (20 MG TOTAL) BY MOUTH DAILY. 06/13/16   Drema Dallas, DO  tamsulosin (FLOMAX) 0.4 MG CAPS capsule Take 2 capsules (0.8 mg total) by mouth daily after supper. 11/24/16   Eustaquio Boyden, MD  traMADol (ULTRAM) 50 MG  tablet Take 25 mg by mouth 2 (two) times daily. Hold if sleepy or confused     [provider]  UNABLE TO FIND Med Name: Magic Cups by mouth 3 times daily    [provider]    ALLERGIES:  No Known Allergies  SOCIAL HISTORY:  Social History   Tobacco Use  . Smoking status: Never Smoker  . Smokeless tobacco: Never Used  Substance Use Topics  . Alcohol use: Yes    Alcohol/week: 0.0 oz    Comment: 12/05/2016 "might have a drink at Christmas"    FAMILY HISTORY: Family History  Problem Relation Age of Onset  . Liver cancer Mother   . Prostate cancer Father     EXAM: BP 119/72   Pulse 95   Temp 98 F (36.7 C) (Axillary)   Resp 18   SpO2 97%  CONSTITUTIONAL: Alert and is argumentative and Spanish but does not answer questions or follow commands.  Spanish interpreter used. HEAD: Normocephalic; laceration to the forehead without foreign body EYES: Conjunctivae clear, PERRL, EOMI ENT: normal nose; no rhinorrhea; moist mucous membranes; pharynx without lesions noted; no dental injury; no septal hematoma NECK: Supple, no meningismus, no LAD; no midline spinal tenderness, step-off or deformity; trachea midline, patient in cervical collar CARD: Irregularly irregular; S1 and S2 appreciated; no murmurs, no clicks, no rubs, no gallops RESP: Normal chest excursion without splinting or tachypnea; breath sounds clear and equal bilaterally; no wheezes, no rhonchi, no rales; no hypoxia or respiratory distress CHEST:  chest wall stable, no crepitus or ecchymosis or deformity, nontender to palpation; no flail chest ABD/GI: Normal bowel sounds; non-distended; soft, non-tender, no rebound, no guarding; no ecchymosis or other lesions noted PELVIS:  stable, nontender to palpation BACK:  The back appears normal and is non-tender to palpation, there is no CVA tenderness; no midline spinal tenderness, step-off or deformity EXT: Normal ROM in all joints; non-tender to palpation; no edema;  normal capillary refill; no cyanosis, no bony tenderness or bony deformity of patient's extremities, no joint effusion, compartments are soft, extremities are warm and well-perfused, no ecchymosis SKIN: Normal color for age and race; warm NEURO: Moves all extremities equally, agitated, yelling, no facial asymmetry, speech is normal   MEDICAL DECISION MAKING: Patient here with fall when he stood up from his wheelchair.  He is at his neurologic baseline.  Has laceration to the forehead that is well approximated and does not need sutures for repair.  We will clean this wound and applied a sterile dressing.  Will obtain CT of his head and cervical spine.  No other sign of new trauma on exam.  Tetanus vaccination last updated January 2019.  ED PROGRESS: Patient CT scan showed no acute abnormality.  We will remove his c-collar.  He is safe to be discharged back to his nursing facility.  I reviewed all nursing notes, vitals, pertinent  previous records, EKGs, lab and urine results, imaging (as available).       Hillary Struss, Layla MawKristen N, DO 10/03/17 [phone]

## 2018-08-31 IMAGING — CT CT CHEST W/ CM
2 of 5 series · 12 of 36 positions shown, 15 images · IV contrast (Omni 300)
Comparison: Pelvic CT from 12/15/2015, chest radiograph from
11/20/2015

CLINICAL DATA: Pain after fall

EXAM:
CT CHEST, ABDOMEN, AND PELVIS WITH CONTRAST
TECHNIQUE: Multidetector CT imaging of the chest, abdomen and pelvis was
performed following the standard protocol during bolus
administration of intravenous contrast.
CONTRAST:  75mL 6IQPPH-SYY IOPAMIDOL (6IQPPH-SYY) INJECTION 61%

[Series 3: cap with 5.0 mm (person_name) (person_name) · axial · 0.73mm/px · z∈[-986,-456]mm · 9 of 128 slices shown, 12 images]
[im 11/128  mediastinal]
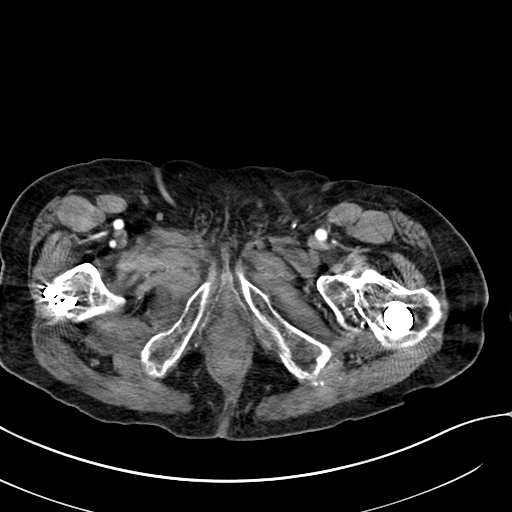
[im 11/128  lung]
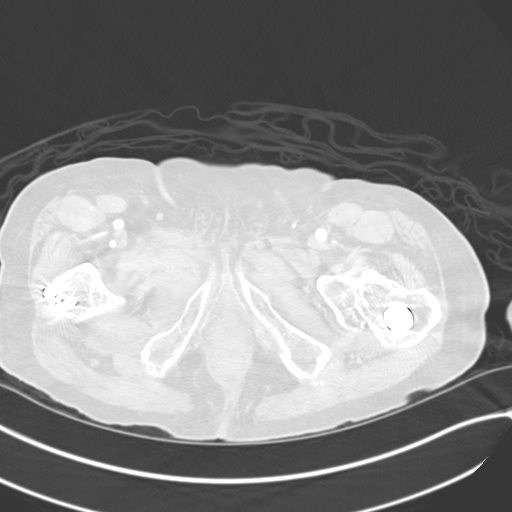
[im 22/128  lung]
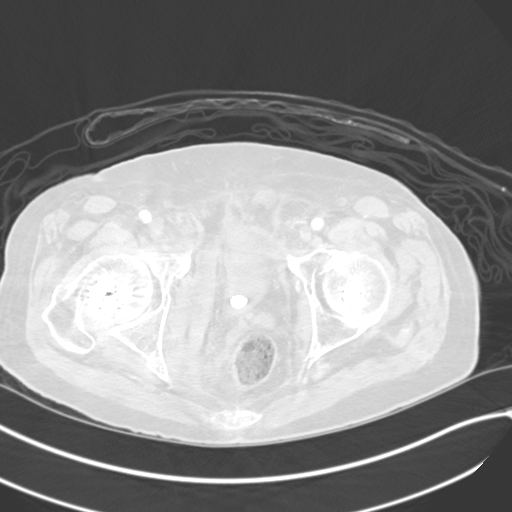
[im 43/128  lung]
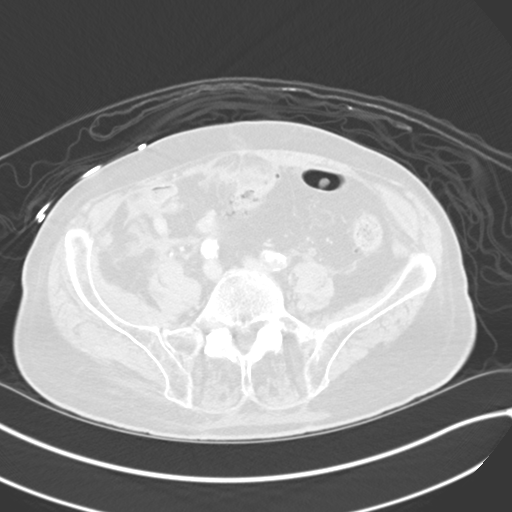
[im 53/128  lung]
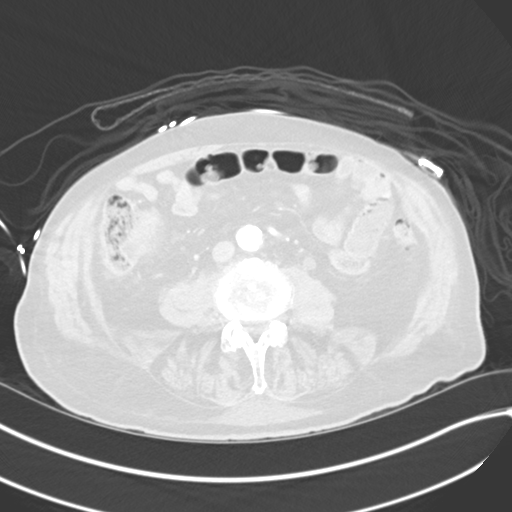
[im 64/128  mediastinal]
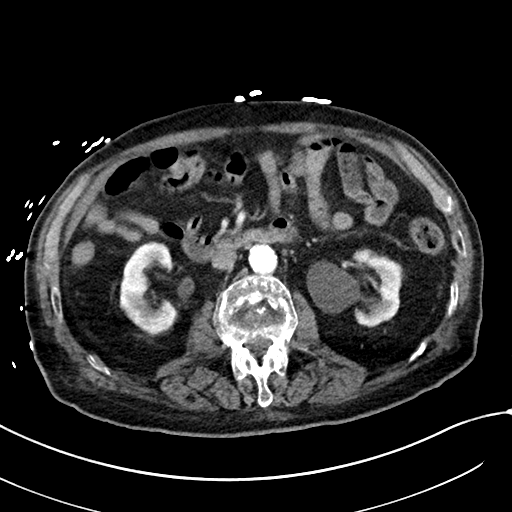
[im 64/128  lung]
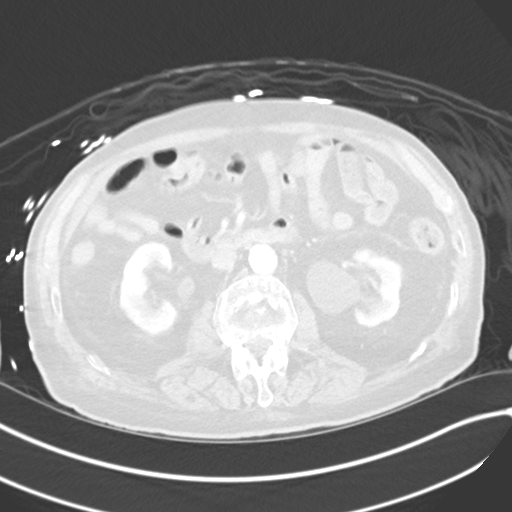
[im 75/128  lung]
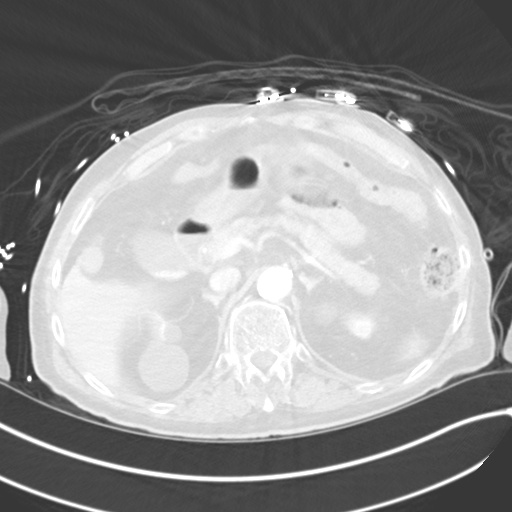
[im 85/128  lung]
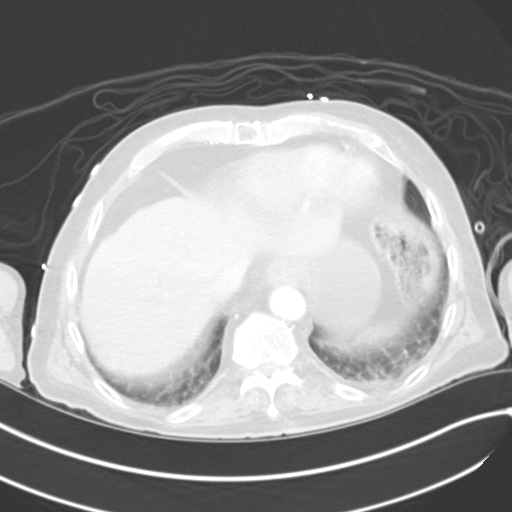
[im 106/128  lung]
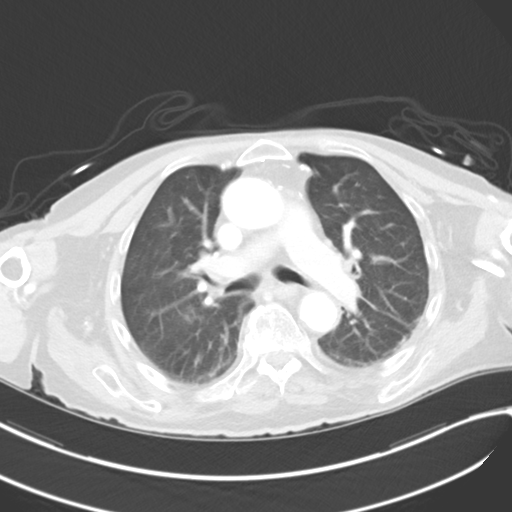
[im 117/128  mediastinal]
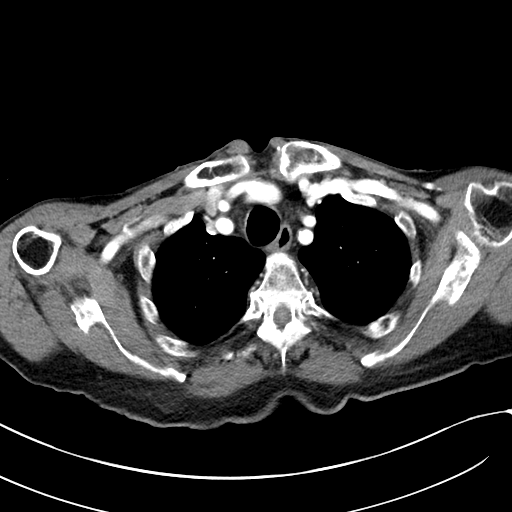
[im 117/128  lung]
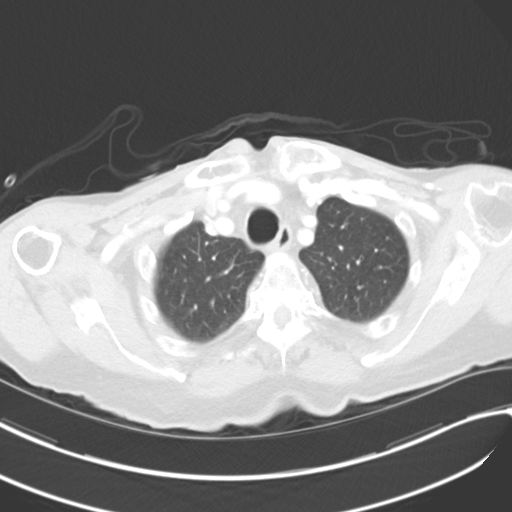

[Series 5: cap with 3.0 mm st cor · coronal · 0.68mm/px · 3 of 83 slices shown]
[im 17/83  lung]
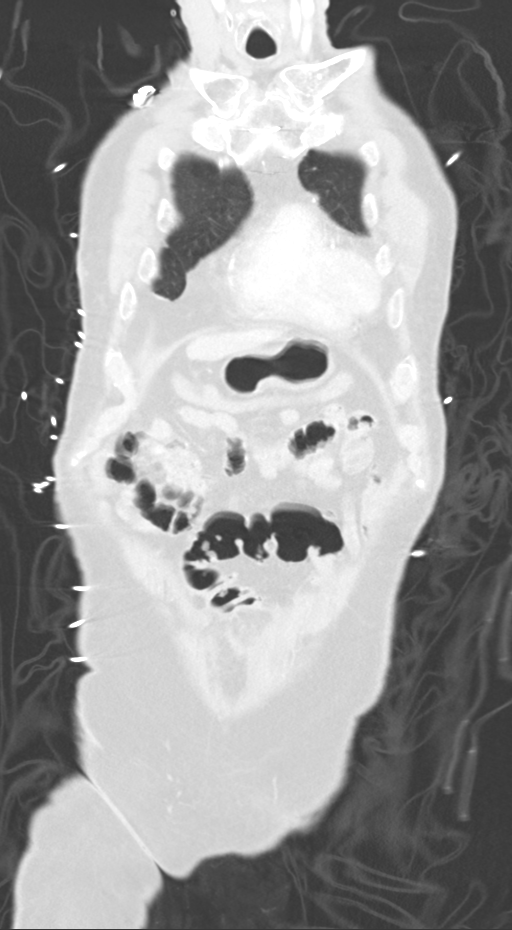
[im 33/83  lung]
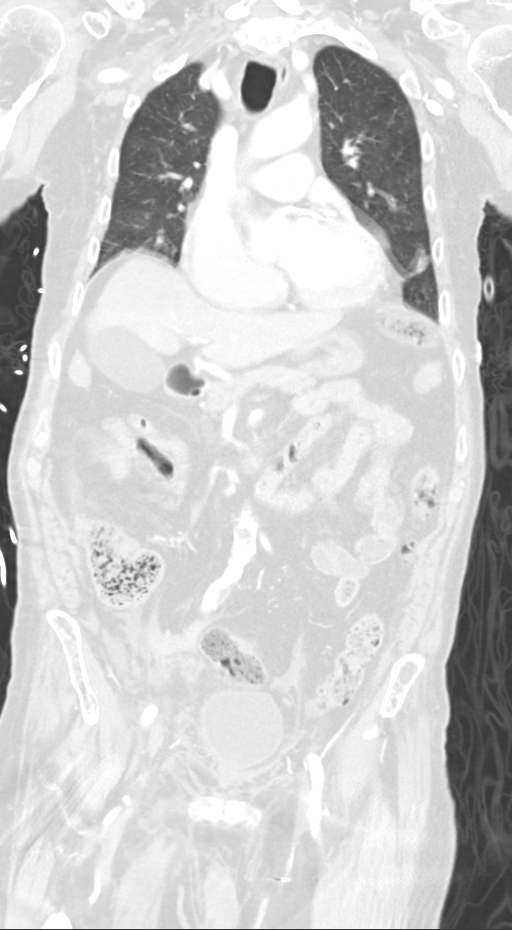
[im 50/83  lung]
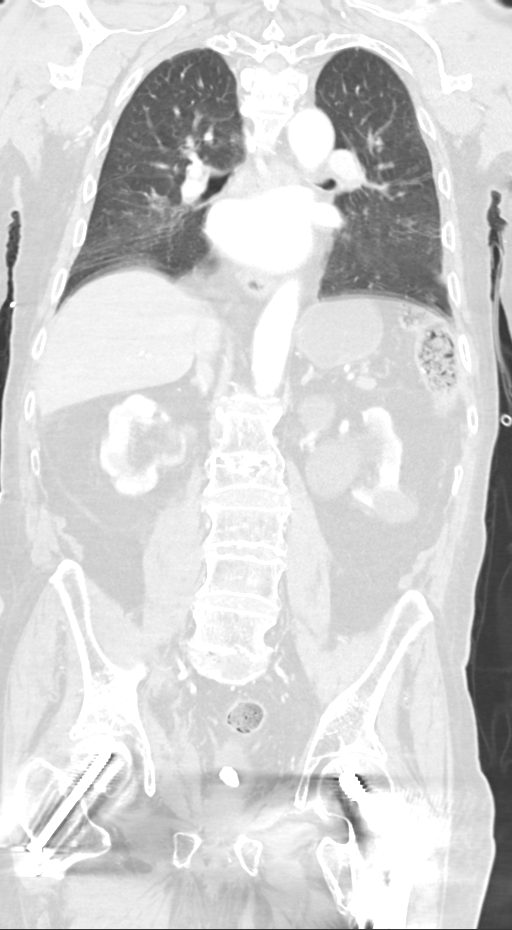

[12 of 36 positions shown; findings below may reference images not displayed]

FINDINGS: CT CHEST FINDINGS

Cardiovascular: Cardiomegaly without pericardial effusion.
Three-vessel coronary arteriosclerosis is visualized. There is
aortic atherosclerosis with ectasia of the ascending aorta measuring
up to 3.8 cm. No dissection is noted. The patient is status post
CABG. Atherosclerosis at the origins of the great vessels.

Mediastinum/Nodes: No enlarged mediastinal, hilar, or axillary lymph
nodes. Thyroid gland, trachea, and esophagus demonstrate no
significant findings.

Lungs/Pleura: There is dependent bibasilar atelectasis. No pulmonary
consolidation or pneumothorax. No pleural effusion.

Musculoskeletal: Rotator cuff calcific tendinopathy about the right
shoulder. No acute fracture of the ribs. Chronic moderate
compression of T4 without retropulsion. Mild inferior endplate
compression of T7 also chronic in appearance.

CT ABDOMEN PELVIS FINDINGS

Hepatobiliary: Cholelithiasis without complication. Homogeneous
enhancement of the liver without biliary dilatation.

Pancreas: Atrophic pancreas without ductal dilatation or mass.

Spleen: No splenomegaly or mass.

Adrenals/Urinary Tract: Normal bilateral adrenal glands. Variable
size cysts of both kidneys some are too small to further
characterize. No obstructive uropathy. Cortical scarring of left
kidney. Bladder stone is noted along the dependent wall measuring 15
mm. There is mural thickening of the bladder on the right, query
inflammation or thickening due to mass effect from adjacent small
pelvic side wall hemorrhage and edema. Ectasia of the left ureter
without definite obstruction. Bilateral extrarenal pelves are noted.

Stomach/Bowel: Stomach is within normal limits. Appendix appears
normal. No evidence of bowel wall thickening, distention, or
inflammatory changes.

Vascular/Lymphatic: Aortic and side branch atherosclerosis. No
enlarged abdominal or pelvic lymph nodes.

Reproductive: Prostate is unremarkable.

Other: No abdominal wall hernia or abnormality. No abdominopelvic
ascites.

Musculoskeletal: Coronal fracture through the acetabular roof,
anteromedial wall of the acetabulum extending into the right iliac
bone consistent with an anterior column fracture of the acetabulum.
Associated small amount extraperitoneal hemorrhage and edema along
the right obturator internus and retro pubic space of Retzius
causing slight mass effect on the adjacent bladder. Chronic moderate
L2 fracture. ORIF of both proximal femora.
IMPRESSION: 1. New anterior column fracture of the right acetabulum with
associated small amount of rightsided hemorrhage and edema in the
extraperitoneal retropubic space of Retzius.
2. Cardiomegaly with 3 vessel coronary arteriosclerosis and ectasia
of the ascending aorta. Aortic atherosclerosis.
3. Chronic T4 and T7 compression fractures.
4. Uncomplicated cholelithiasis.
5. Bilateral renal cysts and cortical scarring of the left kidney.
6. Nonobstructing bladder calculus. Eccentric right-sided bladder
wall thickening possibly from underdistention and mass-effect from
the extraperitoneal hemorrhage fluid. Asymmetric cystitis/mucosal
inflammation is not excluded.

## 2018-09-07 ENCOUNTER — Non-Acute Institutional Stay: Payer: Medicare Other | Admitting: Primary Care

## 2018-09-15 IMAGING — CR DG WRIST COMPLETE 3+V*R*
4 series · 4 of 4 positions shown · non-contrast
Comparison: 12/02/2016.

CLINICAL DATA: Right wrist pain following a fall today hat a
[HOSPITAL]. Known right wrist fracture.

EXAM:
RIGHT WRIST - COMPLETE 3+ VIEW

[x wrist pa right]
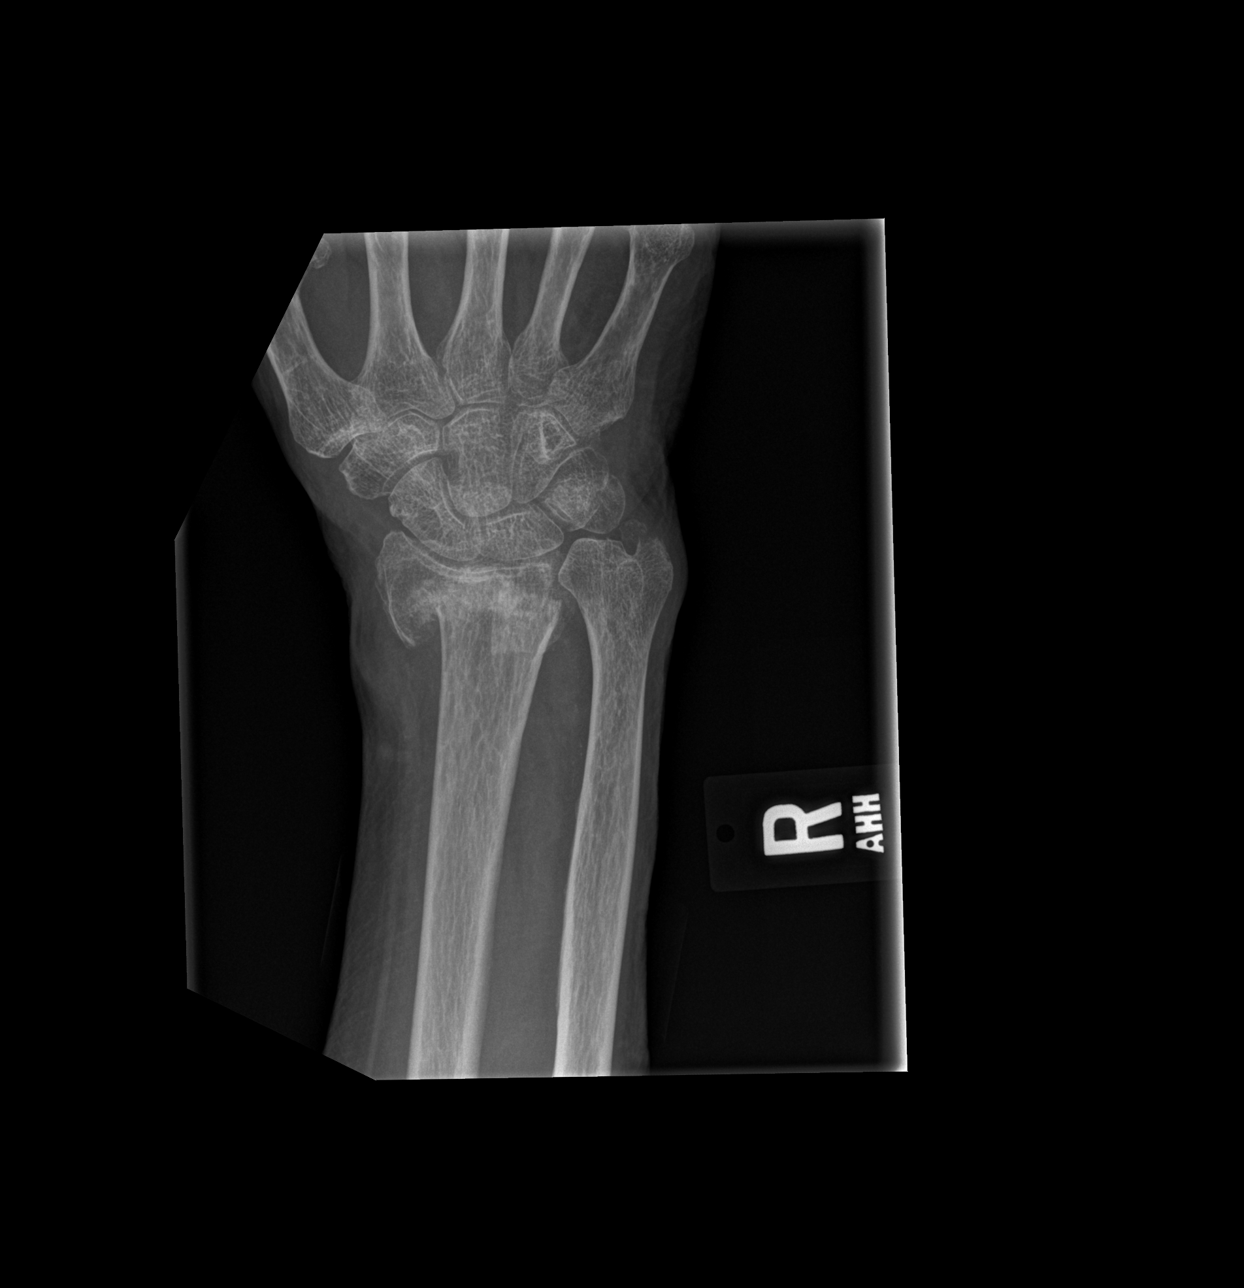

[x wrist obl right]
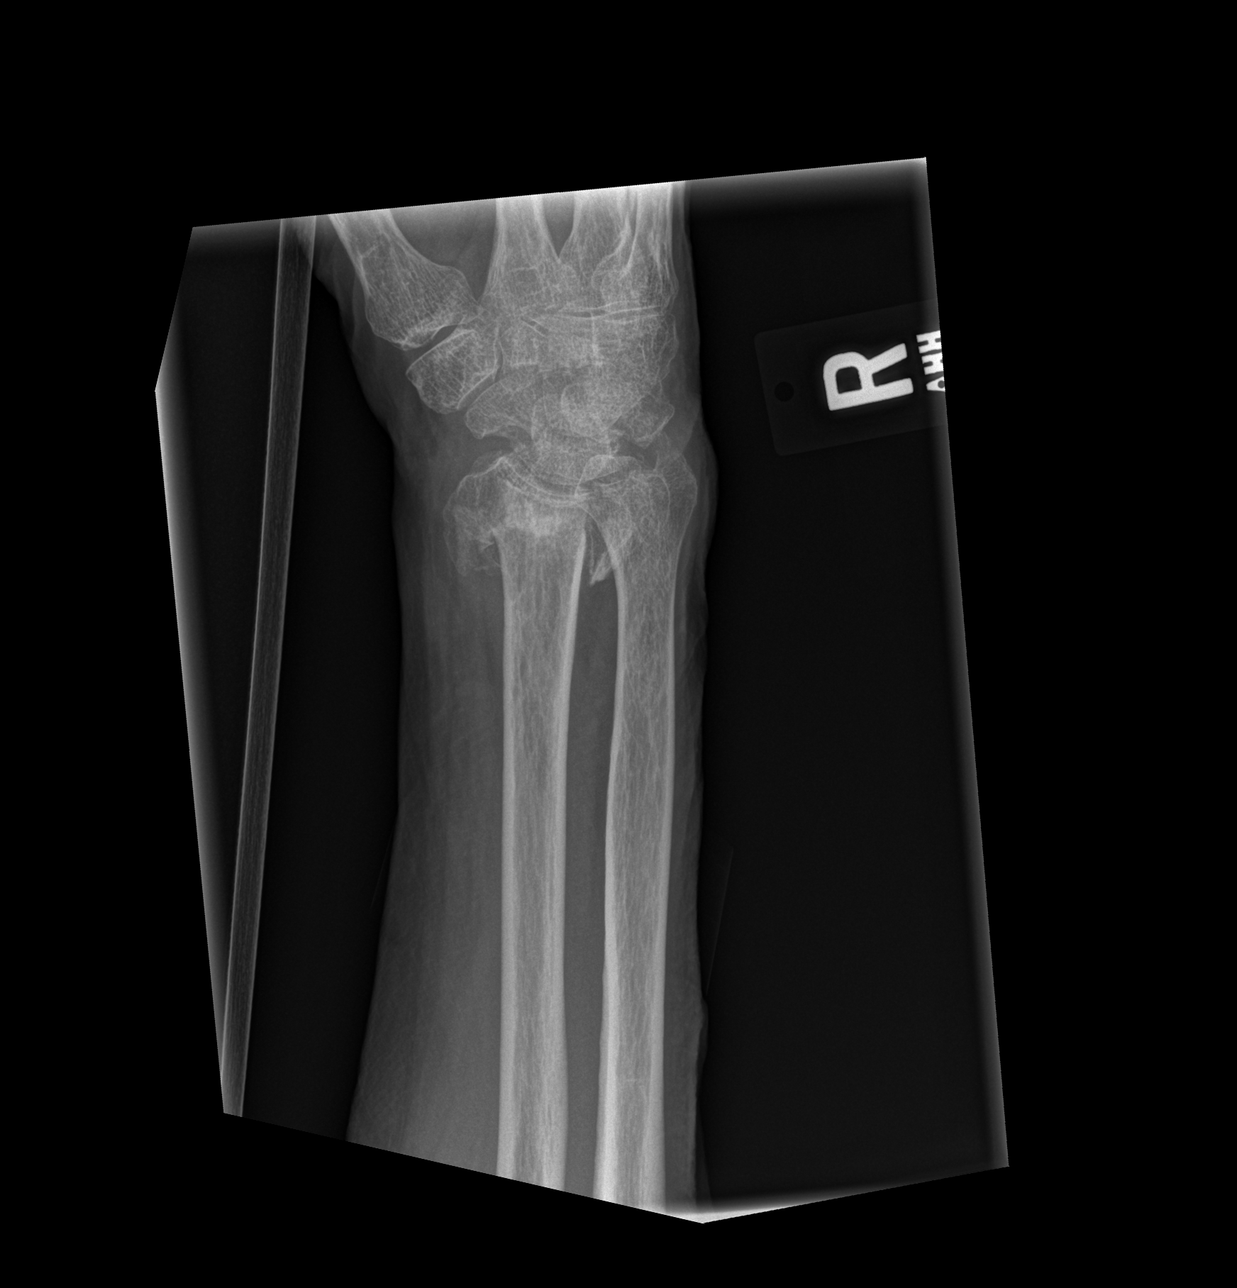

[x wrist lat right]
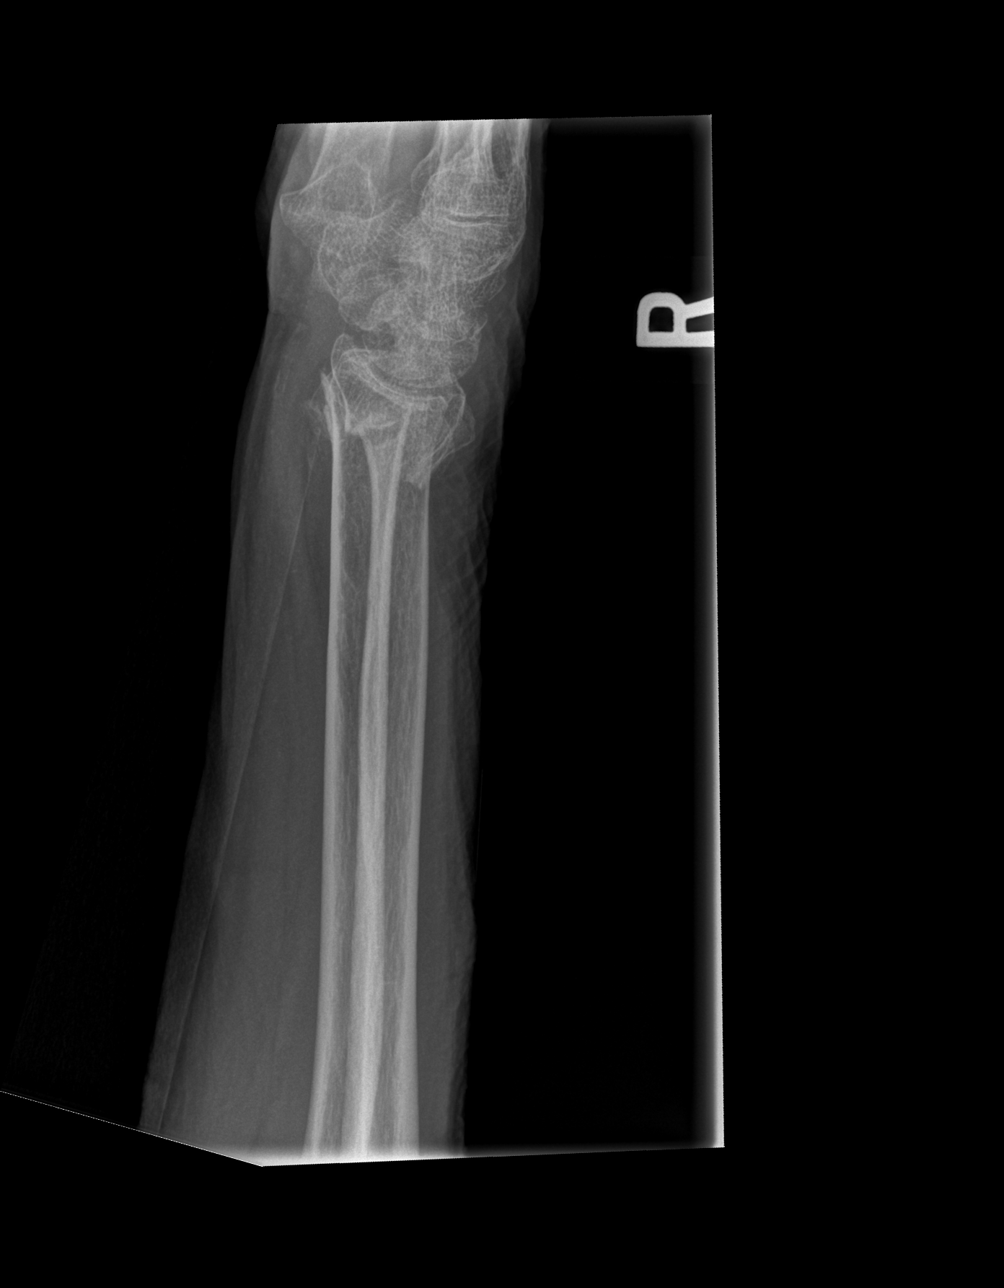

[x wrist navicular view right]
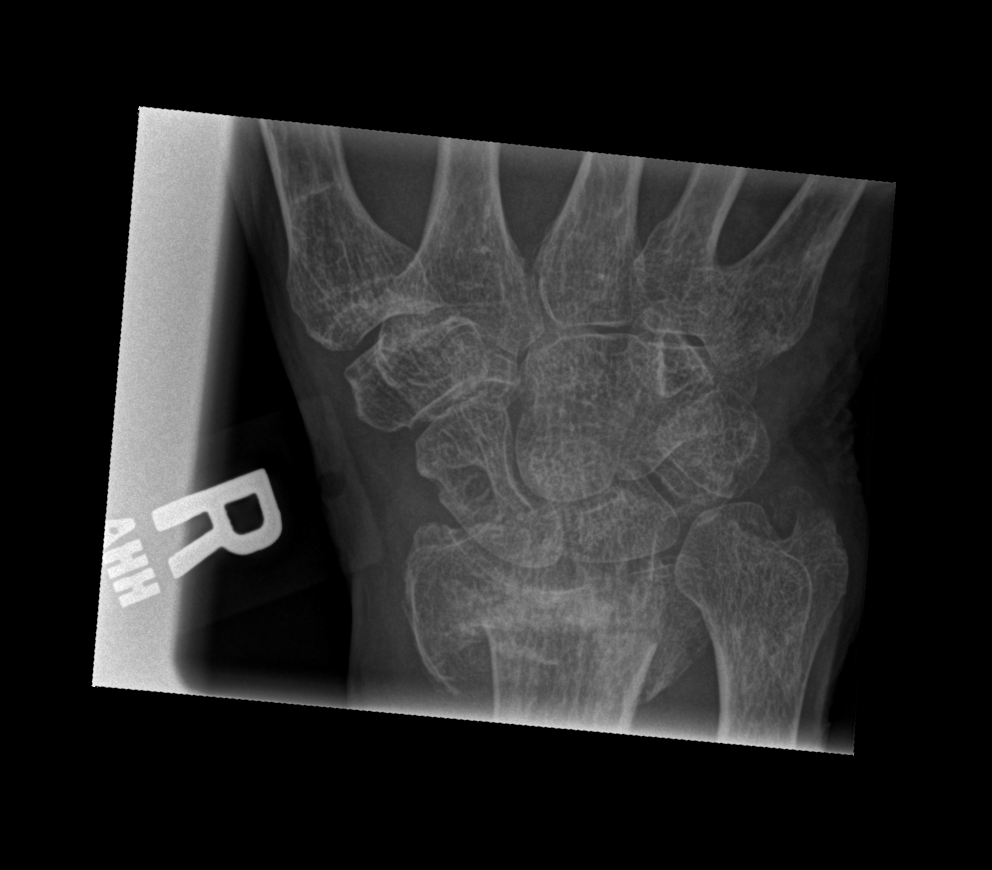

[4 of 4 positions shown; findings below may reference images not displayed]

FINDINGS: Again demonstrated is a transverse fracture of the distal radial
metaphysis with interval [DATE] shaft width of radial displacement of
the distal fragment and [DATE] shaft width of dorsal displacement of
the distal fragment. There is also dorsal angulation of the distal
fragment. An ulnar styloid fracture is also again demonstrated with
interval radial displacement of the distal fragment.
IMPRESSION: Interval displacement of the previously demonstrated distal radius
and ulnar styloid fractures.

## 2019-01-28 DEATH — deceased

## 2021-07-01 ENCOUNTER — Telehealth: Payer: Self-pay | Admitting: Family Medicine

## 2021-07-01 NOTE — Telephone Encounter (Signed)
I saw daughter this week, she let me know that patient passed away. I have updated chart.
